# Patient Record
Sex: Female | Born: 1945 | Race: Black or African American | Hispanic: No | Marital: Married | Smoking: Never smoker
Health system: Southern US, Community
[De-identification: ages and names within clinical notes are randomized; demographics above are authoritative.]

## PROBLEM LIST (undated history)

## (undated) DIAGNOSIS — M1990 Unspecified osteoarthritis, unspecified site: Secondary | ICD-10-CM

## (undated) DIAGNOSIS — H269 Unspecified cataract: Secondary | ICD-10-CM

## (undated) DIAGNOSIS — D509 Iron deficiency anemia, unspecified: Secondary | ICD-10-CM

## (undated) DIAGNOSIS — G5601 Carpal tunnel syndrome, right upper limb: Secondary | ICD-10-CM

## (undated) DIAGNOSIS — R609 Edema, unspecified: Secondary | ICD-10-CM

## (undated) DIAGNOSIS — K5792 Diverticulitis of intestine, part unspecified, without perforation or abscess without bleeding: Secondary | ICD-10-CM

## (undated) DIAGNOSIS — M25529 Pain in unspecified elbow: Secondary | ICD-10-CM

## (undated) DIAGNOSIS — K5900 Constipation, unspecified: Secondary | ICD-10-CM

## (undated) DIAGNOSIS — K219 Gastro-esophageal reflux disease without esophagitis: Secondary | ICD-10-CM

## (undated) DIAGNOSIS — E785 Hyperlipidemia, unspecified: Secondary | ICD-10-CM

## (undated) DIAGNOSIS — N189 Chronic kidney disease, unspecified: Secondary | ICD-10-CM

## (undated) DIAGNOSIS — N318 Other neuromuscular dysfunction of bladder: Secondary | ICD-10-CM

## (undated) DIAGNOSIS — T7840XA Allergy, unspecified, initial encounter: Secondary | ICD-10-CM

## (undated) DIAGNOSIS — D649 Anemia, unspecified: Secondary | ICD-10-CM

## (undated) DIAGNOSIS — L84 Corns and callosities: Secondary | ICD-10-CM

## (undated) DIAGNOSIS — R5381 Other malaise: Principal | ICD-10-CM

## (undated) DIAGNOSIS — L299 Pruritus, unspecified: Secondary | ICD-10-CM

## (undated) DIAGNOSIS — Z8719 Personal history of other diseases of the digestive system: Secondary | ICD-10-CM

## (undated) DIAGNOSIS — N3281 Overactive bladder: Secondary | ICD-10-CM

## (undated) DIAGNOSIS — J209 Acute bronchitis, unspecified: Secondary | ICD-10-CM

## (undated) DIAGNOSIS — Z5189 Encounter for other specified aftercare: Secondary | ICD-10-CM

## (undated) DIAGNOSIS — M5030 Other cervical disc degeneration, unspecified cervical region: Secondary | ICD-10-CM

## (undated) DIAGNOSIS — R05 Cough: Secondary | ICD-10-CM

## (undated) DIAGNOSIS — I10 Essential (primary) hypertension: Secondary | ICD-10-CM

## (undated) DIAGNOSIS — M7520 Bicipital tendinitis, unspecified shoulder: Secondary | ICD-10-CM

## (undated) DIAGNOSIS — R5383 Other fatigue: Principal | ICD-10-CM

## (undated) DIAGNOSIS — J019 Acute sinusitis, unspecified: Secondary | ICD-10-CM

## (undated) DIAGNOSIS — L659 Nonscarring hair loss, unspecified: Secondary | ICD-10-CM

## (undated) HISTORY — DX: Allergy, unspecified, initial encounter: T78.40XA

## (undated) HISTORY — DX: Unspecified osteoarthritis, unspecified site: M19.90

## (undated) HISTORY — PX: OOPHORECTOMY: SHX86

## (undated) HISTORY — DX: Other malaise: R53.81

## (undated) HISTORY — DX: Carpal tunnel syndrome, right upper limb: G56.01

## (undated) HISTORY — DX: Overactive bladder: N32.81

## (undated) HISTORY — DX: Gastro-esophageal reflux disease without esophagitis: K21.9

## (undated) HISTORY — DX: Hyperlipidemia, unspecified: E78.5

## (undated) HISTORY — DX: Acute bronchitis, unspecified: J20.9

## (undated) HISTORY — DX: Bicipital tendinitis, unspecified shoulder: M75.20

## (undated) HISTORY — DX: Unspecified cataract: H26.9

## (undated) HISTORY — PX: APPENDECTOMY: SHX54

## (undated) HISTORY — DX: Other cervical disc degeneration, unspecified cervical region: M50.30

## (undated) HISTORY — DX: Chronic kidney disease, unspecified: N18.9

## (undated) HISTORY — DX: Diverticulitis of intestine, part unspecified, without perforation or abscess without bleeding: K57.92

## (undated) HISTORY — DX: Other fatigue: R53.83

## (undated) HISTORY — DX: Encounter for other specified aftercare: Z51.89

## (undated) HISTORY — PX: CATARACT EXTRACTION: SUR2

## (undated) HISTORY — DX: Other neuromuscular dysfunction of bladder: N31.8

## (undated) HISTORY — DX: Cough: R05

## (undated) HISTORY — DX: Corns and callosities: L84

## (undated) HISTORY — DX: Personal history of other diseases of the digestive system: Z87.19

## (undated) HISTORY — PX: BREAST BIOPSY: SHX20

## (undated) HISTORY — DX: Pain in unspecified elbow: M25.529

## (undated) HISTORY — DX: Pruritus, unspecified: L29.9

## (undated) HISTORY — PX: ABDOMINAL HYSTERECTOMY: SHX81

## (undated) HISTORY — DX: Constipation, unspecified: K59.00

## (undated) HISTORY — PX: BUNIONECTOMY: SHX129

## (undated) HISTORY — PX: ECTOPIC PREGNANCY SURGERY: SHX613

## (undated) HISTORY — DX: Essential (primary) hypertension: I10

## (undated) HISTORY — DX: Iron deficiency anemia, unspecified: D50.9

## (undated) HISTORY — DX: Anemia, unspecified: D64.9

## (undated) HISTORY — DX: Edema, unspecified: R60.9

## (undated) HISTORY — PX: OTHER SURGICAL HISTORY: SHX169

## (undated) HISTORY — DX: Nonscarring hair loss, unspecified: L65.9

## (undated) HISTORY — DX: Acute sinusitis, unspecified: J01.90

---

## 1997-11-29 ENCOUNTER — Ambulatory Visit (HOSPITAL_COMMUNITY): Admission: RE | Admit: 1997-11-29 | Discharge: 1997-11-29 | Payer: Self-pay | Admitting: Family Medicine

## 1998-12-04 ENCOUNTER — Encounter: Payer: Self-pay | Admitting: Family Medicine

## 1998-12-04 ENCOUNTER — Ambulatory Visit (HOSPITAL_COMMUNITY): Admission: RE | Admit: 1998-12-04 | Discharge: 1998-12-04 | Payer: Self-pay | Admitting: Family Medicine

## 1999-01-16 ENCOUNTER — Emergency Department (HOSPITAL_COMMUNITY): Admission: EM | Admit: 1999-01-16 | Discharge: 1999-01-16 | Payer: Self-pay | Admitting: Emergency Medicine

## 1999-01-16 ENCOUNTER — Encounter: Payer: Self-pay | Admitting: Emergency Medicine

## 1999-02-04 ENCOUNTER — Encounter: Admission: RE | Admit: 1999-02-04 | Discharge: 1999-02-04 | Payer: Self-pay | Admitting: Family Medicine

## 1999-02-04 ENCOUNTER — Encounter: Payer: Self-pay | Admitting: Family Medicine

## 1999-08-27 ENCOUNTER — Other Ambulatory Visit: Admission: RE | Admit: 1999-08-27 | Discharge: 1999-08-27 | Payer: Self-pay | Admitting: Urology

## 1999-08-28 ENCOUNTER — Other Ambulatory Visit: Admission: RE | Admit: 1999-08-28 | Discharge: 1999-08-28 | Payer: Self-pay | Admitting: Urology

## 1999-12-26 ENCOUNTER — Encounter: Payer: Self-pay | Admitting: Obstetrics and Gynecology

## 1999-12-26 ENCOUNTER — Ambulatory Visit (HOSPITAL_COMMUNITY): Admission: RE | Admit: 1999-12-26 | Discharge: 1999-12-26 | Payer: Self-pay | Admitting: Obstetrics and Gynecology

## 2001-02-15 ENCOUNTER — Ambulatory Visit (HOSPITAL_COMMUNITY): Admission: RE | Admit: 2001-02-15 | Discharge: 2001-02-15 | Payer: Self-pay | Admitting: Obstetrics and Gynecology

## 2001-02-15 ENCOUNTER — Encounter: Payer: Self-pay | Admitting: Obstetrics and Gynecology

## 2002-02-28 ENCOUNTER — Ambulatory Visit (HOSPITAL_COMMUNITY): Admission: RE | Admit: 2002-02-28 | Discharge: 2002-02-28 | Payer: Self-pay | Admitting: Internal Medicine

## 2002-02-28 ENCOUNTER — Encounter: Payer: Self-pay | Admitting: Internal Medicine

## 2002-05-09 ENCOUNTER — Ambulatory Visit (HOSPITAL_COMMUNITY): Admission: RE | Admit: 2002-05-09 | Discharge: 2002-05-09 | Payer: Self-pay | Admitting: Obstetrics and Gynecology

## 2002-05-09 ENCOUNTER — Encounter: Payer: Self-pay | Admitting: Obstetrics and Gynecology

## 2002-10-13 ENCOUNTER — Ambulatory Visit (HOSPITAL_COMMUNITY): Admission: RE | Admit: 2002-10-13 | Discharge: 2002-10-13 | Payer: Self-pay | Admitting: Internal Medicine

## 2003-05-29 ENCOUNTER — Ambulatory Visit (HOSPITAL_COMMUNITY): Admission: RE | Admit: 2003-05-29 | Discharge: 2003-05-29 | Payer: Self-pay | Admitting: Family Medicine

## 2004-01-11 ENCOUNTER — Encounter: Admission: RE | Admit: 2004-01-11 | Discharge: 2004-01-11 | Payer: Self-pay | Admitting: Family Medicine

## 2004-06-10 ENCOUNTER — Ambulatory Visit (HOSPITAL_COMMUNITY): Admission: RE | Admit: 2004-06-10 | Discharge: 2004-06-10 | Payer: Self-pay | Admitting: Obstetrics and Gynecology

## 2004-07-15 ENCOUNTER — Ambulatory Visit: Payer: Self-pay | Admitting: Internal Medicine

## 2004-07-16 ENCOUNTER — Ambulatory Visit: Payer: Self-pay | Admitting: Internal Medicine

## 2005-06-16 ENCOUNTER — Ambulatory Visit (HOSPITAL_COMMUNITY): Admission: RE | Admit: 2005-06-16 | Discharge: 2005-06-16 | Payer: Self-pay | Admitting: Obstetrics and Gynecology

## 2005-06-30 ENCOUNTER — Encounter: Admission: RE | Admit: 2005-06-30 | Discharge: 2005-06-30 | Payer: Self-pay | Admitting: Obstetrics and Gynecology

## 2005-10-29 ENCOUNTER — Ambulatory Visit (HOSPITAL_BASED_OUTPATIENT_CLINIC_OR_DEPARTMENT_OTHER): Admission: RE | Admit: 2005-10-29 | Discharge: 2005-10-29 | Payer: Self-pay | Admitting: Orthopedic Surgery

## 2006-06-17 ENCOUNTER — Ambulatory Visit (HOSPITAL_COMMUNITY): Admission: RE | Admit: 2006-06-17 | Discharge: 2006-06-17 | Payer: Self-pay | Admitting: Obstetrics and Gynecology

## 2007-07-26 ENCOUNTER — Ambulatory Visit (HOSPITAL_COMMUNITY): Admission: RE | Admit: 2007-07-26 | Discharge: 2007-07-26 | Payer: Self-pay | Admitting: Family Medicine

## 2007-07-26 ENCOUNTER — Encounter: Payer: Self-pay | Admitting: Internal Medicine

## 2007-08-01 ENCOUNTER — Other Ambulatory Visit: Admission: RE | Admit: 2007-08-01 | Discharge: 2007-08-01 | Payer: Self-pay | Admitting: Family Medicine

## 2007-08-01 LAB — CONVERTED CEMR LAB: Pap Smear: NORMAL

## 2007-11-14 ENCOUNTER — Encounter: Payer: Self-pay | Admitting: Internal Medicine

## 2007-12-08 ENCOUNTER — Encounter: Admission: RE | Admit: 2007-12-08 | Discharge: 2007-12-08 | Payer: Self-pay | Admitting: Family Medicine

## 2007-12-09 ENCOUNTER — Encounter: Payer: Self-pay | Admitting: Internal Medicine

## 2007-12-23 ENCOUNTER — Encounter: Payer: Self-pay | Admitting: Internal Medicine

## 2008-04-20 ENCOUNTER — Emergency Department (HOSPITAL_COMMUNITY): Admission: EM | Admit: 2008-04-20 | Discharge: 2008-04-20 | Payer: Self-pay | Admitting: Family Medicine

## 2008-06-06 ENCOUNTER — Ambulatory Visit: Payer: Self-pay | Admitting: Internal Medicine

## 2008-06-06 DIAGNOSIS — K5900 Constipation, unspecified: Secondary | ICD-10-CM | POA: Insufficient documentation

## 2008-06-06 DIAGNOSIS — Z8719 Personal history of other diseases of the digestive system: Secondary | ICD-10-CM

## 2008-06-06 DIAGNOSIS — E785 Hyperlipidemia, unspecified: Secondary | ICD-10-CM

## 2008-06-06 DIAGNOSIS — I10 Essential (primary) hypertension: Secondary | ICD-10-CM

## 2008-06-06 DIAGNOSIS — D509 Iron deficiency anemia, unspecified: Secondary | ICD-10-CM

## 2008-06-06 DIAGNOSIS — L84 Corns and callosities: Secondary | ICD-10-CM

## 2008-06-06 DIAGNOSIS — L659 Nonscarring hair loss, unspecified: Secondary | ICD-10-CM | POA: Insufficient documentation

## 2008-06-06 DIAGNOSIS — N318 Other neuromuscular dysfunction of bladder: Secondary | ICD-10-CM

## 2008-06-06 HISTORY — DX: Other neuromuscular dysfunction of bladder: N31.8

## 2008-06-06 HISTORY — DX: Nonscarring hair loss, unspecified: L65.9

## 2008-06-06 HISTORY — DX: Personal history of other diseases of the digestive system: Z87.19

## 2008-06-06 HISTORY — DX: Constipation, unspecified: K59.00

## 2008-06-06 HISTORY — DX: Corns and callosities: L84

## 2008-06-06 HISTORY — DX: Iron deficiency anemia, unspecified: D50.9

## 2008-06-06 HISTORY — DX: Hyperlipidemia, unspecified: E78.5

## 2008-06-11 ENCOUNTER — Telehealth (INDEPENDENT_AMBULATORY_CARE_PROVIDER_SITE_OTHER): Payer: Self-pay | Admitting: *Deleted

## 2008-06-12 ENCOUNTER — Emergency Department (HOSPITAL_COMMUNITY): Admission: EM | Admit: 2008-06-12 | Discharge: 2008-06-12 | Payer: Self-pay | Admitting: Emergency Medicine

## 2008-06-18 ENCOUNTER — Ambulatory Visit: Payer: Self-pay | Admitting: Internal Medicine

## 2008-06-18 DIAGNOSIS — R5381 Other malaise: Secondary | ICD-10-CM

## 2008-06-18 DIAGNOSIS — R5383 Other fatigue: Secondary | ICD-10-CM

## 2008-06-18 DIAGNOSIS — R059 Cough, unspecified: Secondary | ICD-10-CM

## 2008-06-18 DIAGNOSIS — J209 Acute bronchitis, unspecified: Secondary | ICD-10-CM

## 2008-06-18 DIAGNOSIS — R05 Cough: Secondary | ICD-10-CM

## 2008-06-18 HISTORY — DX: Other malaise: R53.81

## 2008-06-18 HISTORY — DX: Cough, unspecified: R05.9

## 2008-06-18 HISTORY — DX: Other fatigue: R53.83

## 2008-06-18 HISTORY — DX: Acute bronchitis, unspecified: J20.9

## 2008-06-19 ENCOUNTER — Telehealth: Payer: Self-pay | Admitting: Internal Medicine

## 2008-06-20 ENCOUNTER — Encounter: Payer: Self-pay | Admitting: Internal Medicine

## 2008-07-04 ENCOUNTER — Telehealth (INDEPENDENT_AMBULATORY_CARE_PROVIDER_SITE_OTHER): Payer: Self-pay | Admitting: *Deleted

## 2008-07-05 ENCOUNTER — Telehealth (INDEPENDENT_AMBULATORY_CARE_PROVIDER_SITE_OTHER): Payer: Self-pay | Admitting: *Deleted

## 2008-07-12 ENCOUNTER — Telehealth: Payer: Self-pay | Admitting: Internal Medicine

## 2008-08-03 ENCOUNTER — Ambulatory Visit: Payer: Self-pay | Admitting: Internal Medicine

## 2008-08-06 LAB — CONVERTED CEMR LAB
ALT: 34 units/L (ref 0–35)
AST: 29 units/L (ref 0–37)
Albumin: 4.1 g/dL (ref 3.5–5.2)
Alkaline Phosphatase: 72 units/L (ref 39–117)
BUN: 15 mg/dL (ref 6–23)
Basophils Relative: 0.3 % (ref 0.0–3.0)
Bilirubin Urine: NEGATIVE
CO2: 29 meq/L (ref 19–32)
Calcium: 9.7 mg/dL (ref 8.4–10.5)
Chloride: 105 meq/L (ref 96–112)
Cholesterol: 160 mg/dL (ref 0–200)
Creatinine, Ser: 1 mg/dL (ref 0.4–1.2)
HCT: 37.4 % (ref 36.0–46.0)
Hemoglobin, Urine: NEGATIVE
Hemoglobin: 12.5 g/dL (ref 12.0–15.0)
Ketones, ur: NEGATIVE mg/dL
LDL Cholesterol: 86 mg/dL (ref 0–99)
Lymphocytes Relative: 35.1 % (ref 12.0–46.0)
Lymphs Abs: 2.5 10*3/uL (ref 0.7–4.0)
Monocytes Relative: 7.4 % (ref 3.0–12.0)
Neutro Abs: 4.1 10*3/uL (ref 1.4–7.7)
RBC: 4.07 M/uL (ref 3.87–5.11)
Total CHOL/HDL Ratio: 3
Total Protein: 7.5 g/dL (ref 6.0–8.3)
Urine Glucose: NEGATIVE mg/dL
Urobilinogen, UA: 0.2 (ref 0.0–1.0)

## 2008-08-08 ENCOUNTER — Ambulatory Visit: Payer: Self-pay | Admitting: Internal Medicine

## 2008-12-27 ENCOUNTER — Encounter: Payer: Self-pay | Admitting: Internal Medicine

## 2009-01-01 ENCOUNTER — Ambulatory Visit: Payer: Self-pay | Admitting: Internal Medicine

## 2009-01-03 ENCOUNTER — Telehealth: Payer: Self-pay | Admitting: Internal Medicine

## 2009-01-08 ENCOUNTER — Ambulatory Visit: Payer: Self-pay | Admitting: Internal Medicine

## 2009-01-08 DIAGNOSIS — J019 Acute sinusitis, unspecified: Secondary | ICD-10-CM

## 2009-01-08 HISTORY — DX: Acute sinusitis, unspecified: J01.90

## 2009-01-21 ENCOUNTER — Ambulatory Visit: Payer: Self-pay | Admitting: Internal Medicine

## 2009-01-21 DIAGNOSIS — R21 Rash and other nonspecific skin eruption: Secondary | ICD-10-CM

## 2009-01-25 ENCOUNTER — Telehealth: Payer: Self-pay | Admitting: Internal Medicine

## 2009-01-30 ENCOUNTER — Encounter: Payer: Self-pay | Admitting: Internal Medicine

## 2009-01-30 ENCOUNTER — Ambulatory Visit: Payer: Self-pay

## 2009-02-01 ENCOUNTER — Telehealth: Payer: Self-pay | Admitting: Internal Medicine

## 2009-02-19 ENCOUNTER — Ambulatory Visit: Payer: Self-pay | Admitting: Internal Medicine

## 2009-02-19 DIAGNOSIS — R609 Edema, unspecified: Secondary | ICD-10-CM

## 2009-02-19 HISTORY — DX: Edema, unspecified: R60.9

## 2009-04-15 ENCOUNTER — Telehealth: Payer: Self-pay | Admitting: Internal Medicine

## 2009-04-25 ENCOUNTER — Ambulatory Visit: Payer: Self-pay | Admitting: Internal Medicine

## 2009-07-22 ENCOUNTER — Encounter: Payer: Self-pay | Admitting: Internal Medicine

## 2009-07-25 ENCOUNTER — Telehealth (INDEPENDENT_AMBULATORY_CARE_PROVIDER_SITE_OTHER): Payer: Self-pay | Admitting: *Deleted

## 2009-08-19 LAB — HM MAMMOGRAPHY: HM Mammogram: NORMAL

## 2009-09-09 ENCOUNTER — Telehealth: Payer: Self-pay | Admitting: Internal Medicine

## 2009-09-13 ENCOUNTER — Ambulatory Visit (HOSPITAL_COMMUNITY): Admission: RE | Admit: 2009-09-13 | Discharge: 2009-09-13 | Payer: Self-pay | Admitting: Obstetrics & Gynecology

## 2009-09-17 ENCOUNTER — Encounter: Payer: Self-pay | Admitting: Internal Medicine

## 2009-10-01 ENCOUNTER — Encounter: Payer: Self-pay | Admitting: Internal Medicine

## 2010-01-27 ENCOUNTER — Ambulatory Visit: Payer: Self-pay | Admitting: Internal Medicine

## 2010-01-27 LAB — CONVERTED CEMR LAB
ALT: 44 units/L — ABNORMAL HIGH (ref 0–35)
Alkaline Phosphatase: 76 units/L (ref 39–117)
Basophils Relative: 0.5 % (ref 0.0–3.0)
Bilirubin Urine: NEGATIVE
Bilirubin, Direct: 0.1 mg/dL (ref 0.0–0.3)
Chloride: 104 meq/L (ref 96–112)
Creatinine, Ser: 1 mg/dL (ref 0.4–1.2)
Eosinophils Relative: 2.2 % (ref 0.0–5.0)
Hemoglobin, Urine: NEGATIVE
LDL Cholesterol: 103 mg/dL — ABNORMAL HIGH (ref 0–99)
Lymphocytes Relative: 33.4 % (ref 12.0–46.0)
MCV: 90.1 fL (ref 78.0–100.0)
Monocytes Absolute: 0.4 10*3/uL (ref 0.1–1.0)
Neutrophils Relative %: 56.4 % (ref 43.0–77.0)
Potassium: 3.8 meq/L (ref 3.5–5.1)
RBC: 4.17 M/uL (ref 3.87–5.11)
Sodium: 141 meq/L (ref 135–145)
Total CHOL/HDL Ratio: 3
Total Protein, Urine: NEGATIVE mg/dL
Total Protein: 7.6 g/dL (ref 6.0–8.3)
Triglycerides: 124 mg/dL (ref 0.0–149.0)
Urine Glucose: NEGATIVE mg/dL
WBC: 5.9 10*3/uL (ref 4.5–10.5)

## 2010-02-04 ENCOUNTER — Ambulatory Visit: Payer: Self-pay | Admitting: Internal Medicine

## 2010-02-04 ENCOUNTER — Encounter: Payer: Self-pay | Admitting: Internal Medicine

## 2010-05-06 ENCOUNTER — Ambulatory Visit
Admission: RE | Admit: 2010-05-06 | Discharge: 2010-05-06 | Payer: Self-pay | Source: Home / Self Care | Attending: Internal Medicine | Admitting: Internal Medicine

## 2010-05-06 DIAGNOSIS — M7520 Bicipital tendinitis, unspecified shoulder: Secondary | ICD-10-CM

## 2010-05-06 HISTORY — DX: Bicipital tendinitis, unspecified shoulder: M75.20

## 2010-05-11 ENCOUNTER — Encounter: Payer: Self-pay | Admitting: Obstetrics and Gynecology

## 2010-05-20 NOTE — Letter (Signed)
Summary: [facility]   Imported By: Sherian Rein 09/18/2009 09:11:24  _____________________________________________________________________  External Attachment:    Type:   Image     Comment:   External Document

## 2010-05-20 NOTE — Assessment & Plan Note (Signed)
Summary: fu/appt/#/cd   Vital Signs:  Patient profile:   65 year old female Height:      62 inches Weight:      183 pounds BMI:     33.59 O2 Sat:      98 % on Room air Temp:     97.8 degrees F oral Pulse rate:   65 / minute BP sitting:   130 / 80  (left arm) Cuff size:   regular  Vitals Entered ByZella Ball Ewing (April 25, 2009 10:18 AM)  O2 Flow:  Room air CC: BP followup/RE   CC:  BP followup/RE.  History of Present Illness: lost 3 lbs since last visit;  tolerating the medication ok and good complaiicne,  had some first early AM fatgiue to start since last visit, but much less now that she seems to be more used to the regimen;  Pt denies CP, sob, doe, wheezing, orthopnea, pnd, worsening LE edema, palps, dizziness or syncope  Pt denies new neuro symptoms such as headache, facial or extremity weakness   Problems Prior to Update: 1)  Peripheral Edema  (ICD-782.3) 2)  Rash-nonvesicular  (ICD-782.1) 3)  Preventive Health Care  (ICD-V70.0) 4)  Sinusitis- Acute-nos  (ICD-461.9) 5)  Depression  (ICD-311) 6)  Cough  (ICD-786.2) 7)  Fatigue  (ICD-780.79) 8)  Bronchitis, Acute  (ICD-466.0) 9)  Constipation  (ICD-564.00) 10)  Calluses, Feet, Bilateral  (ICD-700) 11)  Anemia-iron Deficiency  (ICD-280.9) 12)  Overactive Bladder  (ICD-596.51) 13)  Alopecia  (ICD-704.00) 14)  Hypertension  (ICD-401.9) 15)  Hyperlipidemia  (ICD-272.4) 16)  Diverticulitis, Hx of  (ICD-V12.79)  Medications Prior to Update: 1)  Labetalol Hcl 200 Mg Tabs (Labetalol Hcl) .Marland Kitchen.. 1 By Mouth Two Times A Day 2)  Vesicare 10 Mg Tabs (Solifenacin Succinate) .Marland Kitchen.. 1 By Mouth Once Daily 3)  Venlafaxine Hcl 50 Mg Tabs (Venlafaxine Hcl) .Marland Kitchen.. 1 By Mouth Once Daily 4)  Pravachol 40 Mg Tabs (Pravastatin Sodium) .Marland Kitchen.. 1po Once Daily 5)  Vitamin D 6)  Tessalon Perles 100 Mg Caps (Benzonatate) .Marland Kitchen.. 1 - 2 By Mouth Three Times A Day As Needed 7)  Tribenzor 40-5-25 Mg Tabs (Olmesartan-Amlodipine-Hctz) .Marland Kitchen.. 1 By Mouth Once  Daily 8)  Triamcinolone Acetonide 0.5 % Crea (Triamcinolone Acetonide) .... Use Asd Two Times A Day As Needed  Current Medications (verified): 1)  Labetalol Hcl 200 Mg Tabs (Labetalol Hcl) .Marland Kitchen.. 1 By Mouth Two Times A Day 2)  Vesicare 10 Mg Tabs (Solifenacin Succinate) .Marland Kitchen.. 1 By Mouth Once Daily 3)  Venlafaxine Hcl 50 Mg Tabs (Venlafaxine Hcl) .Marland Kitchen.. 1 By Mouth Once Daily 4)  Pravachol 40 Mg Tabs (Pravastatin Sodium) .Marland Kitchen.. 1po Once Daily 5)  Vitamin D 6)  Tessalon Perles 100 Mg Caps (Benzonatate) .Marland Kitchen.. 1 - 2 By Mouth Three Times A Day As Needed 7)  Tribenzor 40-5-25 Mg Tabs (Olmesartan-Amlodipine-Hctz) .Marland Kitchen.. 1 By Mouth Once Daily 8)  Triamcinolone Acetonide 0.5 % Crea (Triamcinolone Acetonide) .... Use Asd Two Times A Day As Needed  Allergies (verified): 1)  ! Sulfa  Past History:  Past Medical History: Last updated: 06/06/2008 Diverticulitis, hx of Hyperlipidemia Hypertension alopecia OAB cervical degenerative disease right carpal tunnel syndrome Anemia-iron deficiency  Past Surgical History: Last updated: 06/06/2008 Hysterectomy Oophorectomy - unilateral hx of ectopic pregnancy Appendectomy hx of breast biopsy - benign s/p bunionectomy s/p right thumb surgury  Social History: Last updated: 06/06/2008 Never Smoked Alcohol use-yes Married 1 son work - Special educational needs teacher  Risk Factors: Smoking Status: never (  06/06/2008)  Review of Systems       all otherwise negative per pt -  Physical Exam  General:  alert and overweight-appearing.   Head:  normocephalic and atraumatic.   Eyes:  vision grossly intact, pupils equal, and pupils round.   Ears:  R ear normal and L ear normal.   Nose:  no external deformity and no nasal discharge.   Mouth:  no gingival abnormalities and pharynx pink and moist.   Neck:  supple and no masses.   Lungs:  normal respiratory effort and normal breath sounds.   Heart:  normal rate and regular rhythm.   Extremities:  no edema, no  erythema    Impression & Recommendations:  Problem # 1:  HYPERTENSION (ICD-401.9)  Her updated medication list for this problem includes:    Labetalol Hcl 200 Mg Tabs (Labetalol hcl) .Marland Kitchen... 1 by mouth two times a day    Tribenzor 40-5-25 Mg Tabs (Olmesartan-amlodipine-hctz) .Marland Kitchen... 1 by mouth once daily improved; Continue all previous medications as before this visit   Problem # 2:  PERIPHERAL EDEMA (ICD-782.3)  Her updated medication list for this problem includes:    Tribenzor 40-5-25 Mg Tabs (Olmesartan-amlodipine-hctz) .Marland Kitchen... 1 by mouth once daily resolved, Continue all previous medications as before this visit   Complete Medication List: 1)  Labetalol Hcl 200 Mg Tabs (Labetalol hcl) .Marland Kitchen.. 1 by mouth two times a day 2)  Vesicare 10 Mg Tabs (Solifenacin succinate) .Marland Kitchen.. 1 by mouth once daily 3)  Venlafaxine Hcl 50 Mg Tabs (Venlafaxine hcl) .Marland Kitchen.. 1 by mouth once daily 4)  Pravachol 40 Mg Tabs (Pravastatin sodium) .Marland Kitchen.. 1po once daily 5)  Vitamin D  6)  Tessalon Perles 100 Mg Caps (Benzonatate) .Marland Kitchen.. 1 - 2 by mouth three times a day as needed 7)  Tribenzor 40-5-25 Mg Tabs (Olmesartan-amlodipine-hctz) .Marland Kitchen.. 1 by mouth once daily 8)  Triamcinolone Acetonide 0.5 % Crea (Triamcinolone acetonide) .... Use asd two times a day as needed  Patient Instructions: 1)  Continue all previous medications as before this visit  2)  Please schedule a follow-up appointment in October 2011 with CPX labs

## 2010-05-20 NOTE — Assessment & Plan Note (Signed)
Summary: f/u appt/#/cd   Vital Signs:  Patient profile:   65 year old female Height:      62 inches Weight:      171.13 pounds BMI:     31.41 O2 Sat:      97 % on Room air Temp:     97.8 degrees F oral Pulse rate:   74 / minute BP sitting:   122 / 70  (left arm) Cuff size:   regular  Vitals Entered By: Zella Ball Ewing CMA Duncan Dull) (February 04, 2010 8:25 AM)  O2 Flow:  Room air  Preventive Care Screening  Mammogram:    Date:  08/19/2009    Results:  normal      decliens flu shot today  CC: followup/RE   CC:  followup/RE.  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    Problems Prior to Update: 1)  Preventive Health Care  (ICD-V70.0) 2)  Peripheral Edema  (ICD-782.3) 3)  Rash-nonvesicular  (ICD-782.1) 4)  Preventive Health Care  (ICD-V70.0) 5)  Sinusitis- Acute-nos  (ICD-461.9) 6)  Cough  (ICD-786.2) 7)  Fatigue  (ICD-780.79) 8)  Bronchitis, Acute  (ICD-466.0) 9)  Constipation  (ICD-564.00) 10)  Calluses, Feet, Bilateral  (ICD-700) 11)  Anemia-iron Deficiency  (ICD-280.9) 12)  Overactive Bladder  (ICD-596.51) 13)  Alopecia  (ICD-704.00) 14)  Hypertension  (ICD-401.9) 15)  Hyperlipidemia  (ICD-272.4) 16)  Diverticulitis, Hx of  (ICD-V12.79)  Medications Prior to Update: 1)  Labetalol Hcl 200 Mg Tabs (Labetalol Hcl) .Marland Kitchen.. 1 By Mouth Two Times A Day 2)  Vesicare 10 Mg Tabs (Solifenacin Succinate) .Marland Kitchen.. 1 By Mouth Once Daily 3)  Venlafaxine Hcl 50 Mg Tabs (Venlafaxine  Hcl) .Marland Kitchen.. 1 By Mouth Once Daily 4)  Pravachol 40 Mg Tabs (Pravastatin Sodium) .Marland Kitchen.. 1po Once Daily 5)  Vitamin D 6)  Tessalon Perles 100 Mg Caps (Benzonatate) .Marland Kitchen.. 1 - 2 By Mouth Three Times A Day As Needed 7)  Tribenzor 40-5-25 Mg Tabs (Olmesartan-Amlodipine-Hctz) .Marland Kitchen.. 1 By Mouth Once Daily 8)  Triamcinolone Acetonide 0.5 % Crea (Triamcinolone Acetonide) .... Use Asd Two Times A Day As Needed  Current Medications (verified): 1)  Labetalol Hcl 200 Mg Tabs (Labetalol Hcl) .Marland Kitchen.. 1 By Mouth Two Times A Day 2)  Vesicare 10 Mg Tabs (Solifenacin Succinate) .Marland Kitchen.. 1 By Mouth Once Daily 3)  Venlafaxine Hcl 50 Mg Tabs (Venlafaxine Hcl) .Marland Kitchen.. 1 By Mouth Once Daily 4)  Pravachol 40 Mg Tabs (Pravastatin Sodium) .Marland Kitchen.. 1po Once Daily 5)  Vitamin D 6)  Tessalon Perles 100 Mg Caps (Benzonatate) .Marland Kitchen.. 1 - 2 By Mouth Three Times A Day As Needed 7)  Tribenzor 40-5-25 Mg Tabs (Olmesartan-Amlodipine-Hctz) .Marland Kitchen.. 1 By Mouth Once Daily 8)  Triamcinolone Acetonide 0.5 % Crea (Triamcinolone Acetonide) .... Use Asd Two Times A Day As Needed  Allergies (verified): 1)  ! 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 Diverticulitis, hx of Hyperlipidemia Hypertension alopecia OAB cervical degenerative disease right carpal tunnel syndrome Anemia-iron deficiency  Past Surgical History: Hysterectomy Oophorectomy - unilateral hx of ectopic pregnancy Appendectomy hx of breast biopsy - benign s/p bunionectomy s/p right thumb surgury Cataract extraction - bilat aug 2011 - no longer wears glassesw  Family History: Reviewed  history from 06/06/2008 and no changes required. DM - brother mother - HTN, ovary cancer, elevated cholesterol uncle and aunt with cancer  Social History: Reviewed history from 06/06/2008 and no changes required. 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         all otherwise negative per pt -    Physical Exam  General:  alert and overweight-appearing.   Head:  normocephalic and atraumatic.   Eyes:  vision grossly intact, pupils equal, and pupils round.   Ears:  R ear normal and L ear normal.   Nose:  no external deformity and no nasal discharge.   Mouth:  no gingival abnormalities and pharynx pink and moist.   Neck:  supple and no masses.   Lungs:  normal respiratory effort and normal breath sounds.   Heart:  normal rate and regular rhythm.   Abdomen:  soft, non-tender, and normal bowel sounds.   Msk:  no joint tenderness and no joint swelling.   Extremities:  no edema, no erythema  Neurologic:  cranial nerves II-XII intact and strength normal in all extremities.   Skin:  color normal and no rashes.   Psych:  not anxious appearing and not depressed appearing.     Impression & Recommendations:  Problem # 1:  Preventive Health Care (ICD-V70.0)  Overall doing well, age appropriate education and counseling updated, referral for preventive services and immunizations addressed, dietary counseling and smoking status adressed , most recent labs reviewed with pt, ecg reviewed  with pt   Orders: EKG w/ Interpretation (93000)  Problem # 2:  HYPERTENSION (ICD-401.9)  Her updated medication list for this problem includes:    Labetalol Hcl 200 Mg Tabs (Labetalol hcl) .Marland Kitchen... 1 by mouth two times a day    Tribenzor 40-5-25 Mg Tabs (Olmesartan-amlodipine-hctz) .Marland Kitchen... 1 by mouth once daily  BP today: 122/70 Prior BP: 130/80 (04/25/2009)  Labs Reviewed: K+: 3.8 (01/27/2010) Creat: : 1.0 (01/27/2010)   Chol: 185 (01/27/2010)   HDL: 57.70 (01/27/2010)   LDL: 103 (01/27/2010)   TG: 124.0 (01/27/2010) stable overall by hx and exam, ok to continue meds/tx as is   Complete Medication List: 1)  Labetalol Hcl 200 Mg Tabs (Labetalol hcl) .Marland Kitchen.. 1 by mouth two times a day 2)  Vesicare 10 Mg Tabs (Solifenacin succinate) .Marland Kitchen.. 1 by mouth once daily 3)  Venlafaxine Hcl 50 Mg Tabs (Venlafaxine hcl) .Marland Kitchen.. 1 by mouth once daily 4)  Pravachol 40 Mg Tabs (Pravastatin sodium) .Marland Kitchen.. 1po once daily 5)  Vitamin D  6)  Tribenzor 40-5-25 Mg  Tabs (Olmesartan-amlodipine-hctz) .Marland Kitchen.. 1 by mouth once daily  Patient Instructions: 1)  please call Your GYN for yearly pap smear, and last bone density test if available 2)  please remember to have your flu shot at work 3)  Continue all previous medications as before this visit 4)  Please schedule a follow-up appointment in 1 year or sooner if needed Prescriptions: TRIBENZOR 40-5-25 MG TABS (OLMESARTAN-AMLODIPINE-HCTZ) 1 by mouth once daily  #90 x 3   Entered and Authorized by:   Corwin Levins MD   Signed by:   Corwin Levins MD on 02/04/2010   Method used:   Print then Give to Patient   RxID:   [id number] PRAVACHOL 40 MG TABS (PRAVASTATIN SODIUM) 1po once daily  #90 x 3   Entered and Authorized by:   Corwin Levins MD   Signed by:   Corwin Levins MD on 02/04/2010   Method used:   Print then Give to Patient   RxID:   [id number] VENLAFAXINE HCL 50 MG TABS (VENLAFAXINE HCL) 1 by mouth once daily  #90 x 3   Entered and Authorized by:   Corwin Levins MD   Signed by:   Corwin Levins MD on 02/04/2010   Method used:   Print then Give to Patient   RxID:   [id number] VESICARE 10 MG TABS (SOLIFENACIN SUCCINATE) 1 by mouth once daily  #90 x 3   Entered and Authorized by:   Corwin Levins MD   Signed by:   Corwin Levins MD on 02/04/2010   Method used:   Print then Give to Patient   RxID:   [id number] LABETALOL HCL 200 MG TABS (LABETALOL HCL) 1 by mouth two times a day  #180 x 3   Entered and Authorized by:   Corwin Levins MD   Signed by:   Corwin Levins MD on 02/04/2010   Method used:   Print then Give to Patient   RxID:   [id number]    Orders Added: 1)  EKG w/ Interpretation [93000] 2)  Est. Patient 40-64 years [51884]

## 2010-05-20 NOTE — Progress Notes (Signed)
Summary: Dx Error?  Phone Note Call from Patient Call back at Home Phone [phone]   Caller: Patient Summary of Call: Pt called stating that she was originally Rx'd Venlafaxine by a NP at [facility] for hot flashes but when it was added to her med list here it was attached to a Dx of Depression. Pt says this Dx is causing her Insurance rates to double. Pt is requesting Dx be changed on her Medical Record to reflect correct Dx. please advise Initial call taken by: Margaret Pyle, CMA,  Sep 09, 2009 10:21 AM  Follow-up for Phone Call        not sure how to help, since that is what the med was for;  there is no other diagnosis that I could change to Follow-up by: Corwin Levins MD,  Sep 09, 2009 12:32 PM  Additional Follow-up for Phone Call Additional follow up Details #1::        pt informed to have NP that Rx'd medication send OV for JWJ to review. Pt advised that Dx and medication may be able to be removed and pt can get refills from original prescriber. Additional Follow-up by: Margaret Pyle, CMA,  Sep 09, 2009 1:55 PM

## 2010-05-20 NOTE — Progress Notes (Signed)
Summary: Records request from [employer]  Request for records received from [employer]. Request forwarded to [employer]. Dena Chavis  July 25, 2009 3:04 PM

## 2010-05-22 NOTE — Assessment & Plan Note (Signed)
Summary: L ARM PAIN FOR OVER A WK/NWS  #   Vital Signs:  Patient profile:   65 year old female Height:      62 inches (157.48 cm) Weight:      170 pounds (77.27 kg) O2 Sat:      97 % on Room air Temp:     98.7 degrees F (37.06 degrees C) oral Pulse rate:   80 / minute BP sitting:   140 / 72  (left arm) Cuff size:   regular  Vitals Entered By: Orlan Leavens RMA (May 06, 2010 9:03 AM)  O2 Flow:  Room air CC: Pain in (L) arm x's 3 weeks Is Patient Diabetic? No Pain Assessment Patient in pain? yes     Location: (L) arm Type: aching   Primary Care Provider:  Corwin Levins MD  CC:  Pain in (L) arm x's 3 weeks.  History of Present Illness: c/o pain in L arm located over antecubital fossa onset 3 weeks ago denies precipitating trauma or injury worse in AM (sleeping with LUE bent at elbow all night) or lifting (bicep curl) not exac by movement during the day no hx same no shoulder, neck or hand pain - no numbness or weakness  Current Medications (verified): 1)  Labetalol Hcl 200 Mg Tabs (Labetalol Hcl) .Marland Kitchen.. 1 By Mouth Two Times A Day 2)  Vesicare 10 Mg Tabs (Solifenacin Succinate) .Marland Kitchen.. 1 By Mouth Once Daily 3)  Venlafaxine Hcl 50 Mg Tabs (Venlafaxine Hcl) .Marland Kitchen.. 1 By Mouth Once Daily 4)  Pravachol 40 Mg Tabs (Pravastatin Sodium) .Marland Kitchen.. 1po Once Daily 5)  Vitamin D 6)  Tribenzor 40-5-25 Mg Tabs (Olmesartan-Amlodipine-Hctz) .Marland Kitchen.. 1 By Mouth Once Daily  Allergies (verified): 1)  ! Sulfa  Past History:  Past Medical History: Diverticulitis, hx of Hyperlipidemia Hypertension alopecia OAB cervical degenerative disease right carpal tunnel syndrome Anemia-iron deficiency  Review of Systems  The patient denies fever, chest pain, syncope, and headaches.    Physical Exam  General:  alert, well-developed, well-nourished, and cooperative to examination.    Lungs:  normal respiratory effort, no intercostal retractions or use of accessory muscles; normal breath sounds  bilaterally - no crackles and no wheezes.    Heart:  normal rate, regular rhythm, no murmur, and no rub. BLE without edema. normal DP pulses and normal cap refill in all 4 extremities    Msk:  left biceps tendonitis, ligamentous fx intact - no deformity and FROM elbow with good strength   Impression & Recommendations:  Problem # 1:  BICEPS TENDINITIS, LEFT (ICD-726.12)  oral NSAIDs now - education on dx and tx consider topical nsaid or PT if unimproved symptoms  -  pt understands and agrees to same  Orders: Prescription Created Electronically [phone])  Problem # 2:  HYPERTENSION (ICD-401.9)  watch bp on short term nsaid use - Her updated medication list for this problem includes:    Labetalol Hcl 200 Mg Tabs (Labetalol hcl) .Marland Kitchen... 1 by mouth two times a day    Tribenzor 40-5-25 Mg Tabs (Olmesartan-amlodipine-hctz) .Marland Kitchen... 1 by mouth once daily  BP today: 140/72 Prior BP: 122/70 (02/04/2010)  Labs Reviewed: K+: 3.8 (01/27/2010) Creat: : 1.0 (01/27/2010)   Chol: 185 (01/27/2010)   HDL: 57.70 (01/27/2010)   LDL: 103 (01/27/2010)   TG: 124.0 (01/27/2010)  Complete Medication List: 1)  Labetalol Hcl 200 Mg Tabs (Labetalol hcl) .Marland Kitchen.. 1 by mouth two times a day 2)  Vesicare 10 Mg Tabs (Solifenacin succinate) .Marland Kitchen.. 1 by mouth  once daily 3)  Venlafaxine Hcl 50 Mg Tabs (Venlafaxine hcl) .Marland Kitchen.. 1 by mouth once daily 4)  Pravachol 40 Mg Tabs (Pravastatin sodium) .Marland Kitchen.. 1po once daily 5)  Vitamin D  6)  Tribenzor 40-5-25 Mg Tabs (Olmesartan-amlodipine-hctz) .Marland Kitchen.. 1 by mouth once daily 7)  Diclofenac Sodium 50 Mg Tbec (Diclofenac sodium) .Marland Kitchen.. 1 by mouth two times a day x 7 days, then as needed for pain  Patient Instructions: 1)  it was good to see you today. 2)  use diclofenac two times a day x 7 days, then as needed for elbow pain - your prescription has been electronically submitted to your pharmacy. Please take as directed. Contact our office if you believe you're having problems with the  medication(s).  3)  wear at elastic elbow band at night as discussed 4)  Return in 7-10 days if you're not better:sooner if you're feeling worse. Prescriptions: DICLOFENAC SODIUM 50 MG TBEC (DICLOFENAC SODIUM) 1 by mouth two times a day x 7 days, then as needed for pain  #30 x 0   Entered and Authorized by:   Newt Lukes MD   Signed by:   Newt Lukes MD on 05/06/2010   Method used:   Electronically to        [facility] (retail)       [address]       [place], [place]  [address]       Ph: [phone]       Fax: [phone]   RxID:   [id number]    Orders Added: 1)  Est. Patient Level IV [66063] 2)  Prescription Created Electronically [phone]

## 2010-05-23 NOTE — Letter (Signed)
[facility]   Imported By: Sherian Rein 09/18/2009 09:10:37  _____________________________________________________________________  External Attachment:    Type:   Image     Comment:   External Document

## 2010-05-26 ENCOUNTER — Ambulatory Visit (INDEPENDENT_AMBULATORY_CARE_PROVIDER_SITE_OTHER)
Admission: RE | Admit: 2010-05-26 | Discharge: 2010-05-26 | Disposition: A | Discharge status: Home / Self Care | Payer: BC Managed Care – PPO | Source: Ambulatory Visit | Attending: Internal Medicine | Admitting: Internal Medicine

## 2010-05-26 ENCOUNTER — Ambulatory Visit (INDEPENDENT_AMBULATORY_CARE_PROVIDER_SITE_OTHER): Payer: BC Managed Care – PPO | Admitting: Internal Medicine

## 2010-05-26 ENCOUNTER — Encounter: Payer: Self-pay | Admitting: Internal Medicine

## 2010-05-26 ENCOUNTER — Other Ambulatory Visit: Payer: Self-pay | Admitting: Internal Medicine

## 2010-05-26 DIAGNOSIS — L299 Pruritus, unspecified: Secondary | ICD-10-CM

## 2010-05-26 DIAGNOSIS — M25529 Pain in unspecified elbow: Secondary | ICD-10-CM

## 2010-05-26 DIAGNOSIS — R21 Rash and other nonspecific skin eruption: Secondary | ICD-10-CM

## 2010-05-26 HISTORY — DX: Pain in unspecified elbow: M25.529

## 2010-05-27 ENCOUNTER — Telehealth: Payer: Self-pay | Admitting: Internal Medicine

## 2010-05-30 ENCOUNTER — Telehealth: Payer: Self-pay | Admitting: Internal Medicine

## 2010-06-05 NOTE — Progress Notes (Signed)
Summary: PHARM ?  Phone Note From Pharmacy   Caller: [facility] Summary of Call: [facility] called - pharm want clarification on pennsaid rx. 3 to 5 drops seems like very low dose. Is this correct?  Initial call taken by: Lamar Sprinkles, CMA,  May 27, 2010 12:09 PM  Follow-up for Phone Call        It is for smaller joints. Correct dose on RX Follow-up by: Tresa Garter MD,  May 27, 2010 3:25 PM  Additional Follow-up for Phone Call Additional follow up Details #1::        Pharm informed Additional Follow-up by: Lamar Sprinkles, CMA,  May 27, 2010 3:43 PM

## 2010-06-05 NOTE — Assessment & Plan Note (Signed)
Summary: DR JWJ PT NO SLOT-L ARM PAIN-SEEN BY DR VL IN JAN CONCERNING ...   Vital Signs:  Patient profile:   65 year old female Height:      62 inches Weight:      171 pounds BMI:     31.39 Temp:     97.8 degrees F oral Pulse rate:   73 / minute Pulse rhythm:   regular Resp:     16 per minute BP sitting:   148 / 82  (left arm) Cuff size:   regular  Vitals Entered By: Lanier Prude, Beverly Gust) (May 26, 2010 2:44 PM) CC: intermittent pain in Lt arm   Primary Care Provider:  Corwin Levins MD  CC:  intermittent pain in Lt arm.  History of Present Illness: C/o dull pain in L forearm/elbow, bending makes it worse.  She had it X 2 months . Meds did not help... C/o scratcing all over at night x years  Current Medications (verified): 1)  Labetalol Hcl 200 Mg Tabs (Labetalol Hcl) .Marland Kitchen.. 1 By Mouth Two Times A Day 2)  Vesicare 10 Mg Tabs (Solifenacin Succinate) .Marland Kitchen.. 1 By Mouth Once Daily 3)  Venlafaxine Hcl 50 Mg Tabs (Venlafaxine Hcl) .Marland Kitchen.. 1 By Mouth Once Daily 4)  Pravachol 40 Mg Tabs (Pravastatin Sodium) .Marland Kitchen.. 1po Once Daily 5)  Vitamin D 6)  Tribenzor 40-5-25 Mg Tabs (Olmesartan-Amlodipine-Hctz) .Marland Kitchen.. 1 By Mouth Once Daily 7)  Diclofenac Sodium 50 Mg Tbec (Diclofenac Sodium) .Marland Kitchen.. 1 By Mouth Two Times A Day X 7 Days, Then As Needed For Pain  Allergies: 1)  ! Sulfa 2)  ! Allegra Allergy (Fexofenadine Hcl) 3)  ! 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    Physical Exam  General:  alert, well-developed, well-nourished, and  cooperative to examination.    Mouth:  no gingival abnormalities and pharynx pink and moist.   Neck:  supple and no masses.   Lungs:  normal respiratory effort, no intercostal retractions or use of accessory muscles; normal breath sounds bilaterally - no crackles and no wheezes.    Heart:  normal rate, regular rhythm, no murmur, and no rub. BLE without edema. normal DP pulses and normal cap refill in all 4 extremities    Msk:  left biceps distally and prox wrist , ligamentous fracture flexors are tender intact - no deformity and FROM elbow with good strength Skin:  dry rash in elbows   Impression & Recommendations:  Problem # 1:  ELBOW PAIN (ICD-719.42) L Assessment Deteriorated  Pennsaid Pred Take 40mg  qd for 3 days, then 20 mg qd for 3 days, then 10mg  qd for 6 days, then stop. Take pc.  Inject if not better Xray  Orders: T-Elbow Left 2 View (73070TC) Ace Wraps 3-5 in/yard  (A2130)  Problem # 2:  SKIN RASH (ICD-782.1) ? eczema Assessment: Unchanged  Her updated medication list for this problem includes:    Triamcinolone Acetonide 0.5 % Crea (Triamcinolone acetonide) ..... Use two times a day prn  Problem # 3:  PRURITUS (ICD-698.9) Assessment: Unchanged Triamc cream Med options discussed  Complete Medication List: 1)  Labetalol Hcl 200  Mg Tabs (Labetalol hcl) .Marland Kitchen.. 1 by mouth two times a day 2)  Vesicare 10 Mg Tabs (Solifenacin succinate) .Marland Kitchen.. 1 by mouth once daily 3)  Venlafaxine Hcl 50 Mg Tabs (Venlafaxine hcl) .Marland Kitchen.. 1 by mouth once daily 4)  Pravachol 40 Mg Tabs (Pravastatin sodium) .Marland Kitchen.. 1po once daily 5)  Vitamin D  6)  Tribenzor 40-5-25 Mg Tabs (Olmesartan-amlodipine-hctz) .Marland Kitchen.. 1 by mouth once daily 7)  Pennsaid 1.5 % Soln (Diclofenac sodium) .... 3-5 gtt on skin three times a day for pain 8)  Prednisone 10 Mg Tabs (Prednisone) .... Take 40mg  qd for 3 days, then 20 mg qd for 3 days, then 10mg  qd for 6 days, then stop. take pc. 9)  Triamcinolone Acetonide 0.5 % Crea  (Triamcinolone acetonide) .... Use two times a day prn  Patient Instructions: 1)  Please schedule a follow-up appointment in 1 month Dr Jonny Ruiz. 2)  Ice qid  Prescriptions: TRIAMCINOLONE ACETONIDE 0.5 % CREA (TRIAMCINOLONE ACETONIDE) use two times a day prn  #120 g x 3   Entered and Authorized by:   Tresa Garter MD   Signed by:   Tresa Garter MD on 05/26/2010   Method used:   Print then Give to Patient   RxID:   [id number] PREDNISONE 10 MG TABS (PREDNISONE) Take 40mg  qd for 3 days, then 20 mg qd for 3 days, then 10mg  qd for 6 days, then stop. Take pc.  #24 x 1   Entered and Authorized by:   Tresa Garter MD   Signed by:   Tresa Garter MD on 05/26/2010   Method used:   Print then Give to Patient   RxID:   [id number] PENNSAID 1.5 % SOLN (DICLOFENAC SODIUM) 3-5 gtt on skin three times a day for pain  #1 x 3   Entered and Authorized by:   Tresa Garter MD   Signed by:   Tresa Garter MD on 05/26/2010   Method used:   Print then Give to Patient   RxID:   [id number]    Orders Added: 1)  T-Elbow Left 2 View [73070TC] 2)  Ace Wraps 3-5 in/yard  [A6449] 3)  Est. Patient Level IV [87564]

## 2010-06-05 NOTE — Progress Notes (Signed)
Summary: xray results  Phone Note Call from Patient Call back at Home Phone [phone] Call back at [phone]   Caller: Patient Summary of Call: Patient called [facility] requesting results fo arm xray done recently. Please advise Initial call taken by: Rock Nephew CMA,  May 30, 2010 1:46 PM  Follow-up for Phone Call        no fracture, but there was a bone spur - does she want a referral to ortho? Follow-up by: Corwin Levins MD,  May 30, 2010 1:54 PM  Additional Follow-up for Phone Call Additional follow up Details #1::        Notified pt with md reponse. pt states yes she would like to see ortho. Let pt know once referral has been set-up will be contacted by our [facility] with appt info Additional Follow-up by: Orlan Leavens RMA,  May 30, 2010 2:08 PM    Additional Follow-up for Phone Call Additional follow up Details #2::    referral done Follow-up by: Corwin Levins MD,  May 30, 2010 2:51 PM

## 2010-09-01 ENCOUNTER — Other Ambulatory Visit (HOSPITAL_COMMUNITY): Payer: Self-pay | Admitting: Obstetrics & Gynecology

## 2010-09-01 DIAGNOSIS — Z1231 Encounter for screening mammogram for malignant neoplasm of breast: Secondary | ICD-10-CM

## 2010-09-05 NOTE — Op Note (Signed)
   NAME:  Deborah Schultz, Deborah Schultz                       ACCOUNT NO.:  [id number]   MEDICAL RECORD NO.:  [id number]                   PATIENT TYPE:  AMB   LOCATION:  ENDO                                 FACILITY:  [facility]   PHYSICIAN:  Lina Sar, M.D. [facility]               DATE OF BIRTH:  1945-09-25   DATE OF PROCEDURE:  10/13/2002  DATE OF DISCHARGE:                                 OPERATIVE REPORT   PROCEDURE:  Colonoscopy.   ENDOSCOPE:  An Olympus single chamber video endoscope.   SEDATION:  None.   FINDINGS:  The Olympus single chamber video endoscope was passed into the  rectum direct into the sigmoid colon.  The patient was monitored by pulse  oximeter and her oxygen saturations were normal.  Prep was excellent.  Anal  canal and rectal ampulla was normal.  There was scattered diverticula  throughout the sigmoid colon, also diverticula were found in the right  colon.  The splenic flexure, transverse colon, hepatic flexure were  unremarkable.  The cecal pouch was entered without difficulty and showed  normal ileocecal valve and appendiceal opening.  The colonoscope was then  retracted, the colon decompressed.  The patient tolerated the procedure  well.   IMPRESSION:  Universal diverticulosis of the left and right colon.   PLAN:  1. High fiber diet.  2. Hemoccult every two years.  3. Consider repeat colonoscopy in 10 years.                                               Lina Sar, M.D. [facility]    DB/MEDQ  D:  10/13/2002  T:  10/13/2002  Job:  [id number]

## 2010-09-05 NOTE — Op Note (Signed)
NAME:  Deborah Schultz, Deborah Schultz                       ACCOUNT NO.:  [id number]   MEDICAL RECORD NO.:  [id number]                   PATIENT TYPE:  AMB   LOCATION:  ENDO                                 FACILITY:  [facility]   PHYSICIAN:  Lina Sar, M.D. [facility]               DATE OF BIRTH:  07/23/1945   DATE OF PROCEDURE:  10/13/2002  DATE OF DISCHARGE:                                 OPERATIVE REPORT   NAME OF PROCEDURE:  Upper endoscopy and colonoscopy.   INDICATIONS FOR PROCEDURE:  This 65 year old African-American female has had  chronic intermittent dysphagia.  Barium swallow showed a short esophageal  ring as well as motility disorder.  She has had problems swallowing solids,  as well a cold liquids.  She is undergoing upper endoscopy to further  evaluate her symptoms and to proceed with esophageal dilatation depending on  the findings of the endoscopy.  She is also undergoing screening colonoscopy  because of her age of 61.   ENDOSCOPE:  Olympus single chamber video endoscope.   SEDATION:  1. Versed 8 mg IV.  2. Demerol 80 mg IV.   FINDINGS:  The Olympus single chamber video endoscope was passed through the  posterior pharynx into the esophagus.  The patient was monitored by pulse  oximeter.  Oxygen saturations were normal.  She was cooperative.  The  proximal and mid esophageal mucosa and lumen were normal.  There were  peristaltic contractions which were observed propagating through the  esophagus.  At the distal esophagus, the lumen was closed, and I had  difficulty in advancing into the stomach.  The lower esophageal sphincter  was tight and I exerted pressure for the endoscope to pop through.  Once it  popped through, the ileus lumen opened.  As the endoscope was retracted back  to the ileus, it was noted that there was a mild esophageal stricture with  an esophageal ring and a mild shelving of the stricture.  Endoscope  traversed back and forth easily through the sphincter.   There was no longer  any spasm over the lower esophageal sphincter.   The stomach was insufflated and it showed normal-appearing gastric mucosa.  Pyloric outlet was normal.  Retroflexion of endoscope revealed normal fundus  and cardia.   The duodenum, duodenal bulb, and descending duodenum were normal.  A guide  wire was then placed into the stomach and Savory dilators passed over the  guide wire using 15, 16, and 17 mm dilators.  There was no blood on the  dilator.  The patient tolerated the procedure well.   IMPRESSION:  1. 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  2. 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    PLAN:  Observe patient after dilatation for the next several weeks.  When  dysphagia is relieved, no further evaluation is necessary.  If the dysphagia  is not relieved, we should proceed with esophageal motility studies to  determine her ________ and to rule out acolasia.                                               Lina Sar, M.D. [facility]    DB/MEDQ  D:  10/13/2002  T:  10/13/2002  Job:  [id number]   cc:   Vale Haven. Andrey Campanile, M.D.  [address]  [place]  [place] [address]  Fax: [phone]

## 2010-09-05 NOTE — Op Note (Signed)
NAMEJAZLYNE, GAUGER           ACCOUNT NO.:  [id number]   MEDICAL RECORD NO.:  [id number]            PATIENT TYPE:   LOCATION:                                 FACILITY:   PHYSICIAN:  Katy Fitch. Sypher, M.D.      DATE OF BIRTH:   DATE OF PROCEDURE:  10/24/2005  DATE OF DISCHARGE:                                 OPERATIVE REPORT   PREOP DIAGNOSIS:  1.  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  2.  Mucoid cyst dorsal nail fold causing deformity of nail plate.  3.  Right carpal tunnel syndrome.   POSTOP DIAGNOSIS:  1.  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  2.  Mucoid cyst dorsal nail fold causing deformity of nail plate.  3.  Right carpal tunnel syndrome.   OPERATION:  1.  Arthrotomy of right thumb IP joint with removal of osteophytes from      adjacent surfaces of distal and proximal phalanx with extensive distal      interphalangeal joint synovectomy and debridement.  2.  Resection of mucoid cyst at dorsal nail fold decompressing nail matrix.  3.  Injection of right carpal tunnel/ulnar bursa.   OPERATING SURGEON:  Katy Fitch. Sypher, M.D.   ASSISTANT:  Marveen Reeks. Dasnoit, P.A.-C   ANESTHESIA:  2% lidocaine metacarpal head level block of right thumb  supplemented by IV sedation.   SUPERVISING ANESTHESIOLOGIST:  Janetta Hora. Gelene Mink, M.D.   INDICATIONS:  Ashten Sarnowski is a 66 year old woman referred for evaluation  and management of a painful right thumb with a significant nail deformity;  and obvious mucous cyst at the dorsal nail fold.  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  In  addition, Ms. Waldron had numbness of her hand and clinical evaluation  revealed right carpal tunnel syndrome.   We recommended sleeping in a  night splint.  Her electrodiagnostic studies  were not pathologic enough to warrant proceeding with carpal tunnel release  at this time.  We recommended consideration of steroid injection into a  right ulnar bursa.  She requested that we resect her mucoid cyst and debride  her DIP joint to relieve her unsightly osteophytes and relieve her nail  deformity.  We recommended proceeding with arthrotomy at this time.  She  understands that the arthrotomy will not stop the progress of her arthritis;  however, by removing the osteophytes, debriding the joint, and removing the  mucous cyst we should be able to restore her nail function to near normal;  and appearance of the nail to near normal.   After informed consent, she was brought to the operating room at this time.   DESCRIPTION OF PROCEDURE:  Aiyla Baucom is brought to the operating room  and placed in the supine position on the operating table.  Following light  sedation the right arm was prepped with Betadine soap and solution and  sterilely draped.  When the  sedation was adequate, we injected the ulnar  bursa with the fingers in full flexion with a 27-gauge needle in the line of  the ring finger.  A mixture of 1 mL of Depo-Medrol 40 mg/mL and 1 mL of 1%  plain lidocaine was infiltrated without complication.  The fingers were  extended at the time of injection to prevent into tendinous injection   Attention was then directed to the thumb.  After digital block had set; and  there was adequate anesthesia of the thumb; the arm was exsanguinated and an  atrial tourniquet in he proximal brachium inflated to 220 mmHg.  A Mercedes-  type incision was used to expose the mucoid cyst distally and the extensor  mechanism and DIP joint capsule proximally.  Care was taken to preserve the  dorsal neurovascular structures.  The joint was entered with a dorsal radial  and dorsal ulnar arthrotomy.  A large osteophyte measuring 8 mm in length  and  approximately 4 mm in height was carefully dissected circumferentially  with a fine Therapist, nutritional, releasing the capsule, and adipose structures.  This was removed with a fine rongeur with piecemeal technique.   A micro curette was used to remove the radial osteophyte at the base of the  distal phalanx.  The joint was then meticulously irrigated with a 19-gauge  dental needle and sterile saline.  The deformities of the thumb were  corrected by this osteophyte debridement and synovectomy.  The mucoid cyst  was circumferentially dissected and found be tracking towards the ulnar  aspect of the IP joint.  The cyst, its contents, and its sinus tract were  all excised.  The wound was then irrigated.  Hemostasis achieved with  bipolar cautery, followed by repair of the skin with corner suture of 5-0  nylon and horizontal mattress sutures of 5-0 nylon.  There were no apparent  complications.      Katy Fitch Sypher, M.D.  Electronically Signed     RVS/MEDQ  D:  10/29/2005  T:  10/29/2005  Job:  [id number]   cc:   Quita Skye. Artis Flock, M.D.  Fax: [phone]

## 2010-09-17 ENCOUNTER — Ambulatory Visit (HOSPITAL_COMMUNITY)
Admission: RE | Admit: 2010-09-17 | Discharge: 2010-09-17 | Disposition: A | Discharge status: Home / Self Care | Payer: BC Managed Care – PPO | Source: Ambulatory Visit | Attending: Obstetrics & Gynecology | Admitting: Obstetrics & Gynecology

## 2010-09-17 DIAGNOSIS — Z1231 Encounter for screening mammogram for malignant neoplasm of breast: Secondary | ICD-10-CM

## 2011-01-27 ENCOUNTER — Telehealth: Payer: Self-pay

## 2011-01-27 DIAGNOSIS — Z0000 Encounter for general adult medical examination without abnormal findings: Secondary | ICD-10-CM

## 2011-01-27 NOTE — Telephone Encounter (Signed)
Put order in for physical labs. 

## 2011-02-11 ENCOUNTER — Other Ambulatory Visit: Payer: Self-pay | Admitting: Internal Medicine

## 2011-02-19 ENCOUNTER — Encounter: Payer: Self-pay | Admitting: Internal Medicine

## 2011-02-19 DIAGNOSIS — R5381 Other malaise: Secondary | ICD-10-CM

## 2011-02-19 DIAGNOSIS — R609 Edema, unspecified: Secondary | ICD-10-CM

## 2011-02-20 ENCOUNTER — Encounter: Payer: Self-pay | Admitting: Internal Medicine

## 2011-02-20 DIAGNOSIS — Z0001 Encounter for general adult medical examination with abnormal findings: Secondary | ICD-10-CM | POA: Insufficient documentation

## 2011-02-20 DIAGNOSIS — Z0000 Encounter for general adult medical examination without abnormal findings: Secondary | ICD-10-CM | POA: Insufficient documentation

## 2011-02-23 ENCOUNTER — Other Ambulatory Visit (INDEPENDENT_AMBULATORY_CARE_PROVIDER_SITE_OTHER): Payer: BC Managed Care – PPO

## 2011-02-23 DIAGNOSIS — Z0000 Encounter for general adult medical examination without abnormal findings: Secondary | ICD-10-CM

## 2011-02-23 LAB — CBC WITH DIFFERENTIAL/PLATELET
Basophils Relative: 0.6 % (ref 0.0–3.0)
Eosinophils Relative: 2.8 % (ref 0.0–5.0)
HCT: 38.8 % (ref 36.0–46.0)
Hemoglobin: 13.1 g/dL (ref 12.0–15.0)
Lymphs Abs: 1.9 10*3/uL (ref 0.7–4.0)
MCV: 90.6 fl (ref 78.0–100.0)
Monocytes Absolute: 0.5 10*3/uL (ref 0.1–1.0)
RBC: 4.28 Mil/uL (ref 3.87–5.11)
WBC: 5.4 10*3/uL (ref 4.5–10.5)

## 2011-02-23 LAB — URINALYSIS, ROUTINE W REFLEX MICROSCOPIC
Leukocytes, UA: NEGATIVE
Specific Gravity, Urine: 1.01 (ref 1.000–1.030)
Urine Glucose: NEGATIVE
Urobilinogen, UA: 0.2 (ref 0.0–1.0)
pH: 6.5 (ref 5.0–8.0)

## 2011-02-23 LAB — BASIC METABOLIC PANEL
BUN: 16 mg/dL (ref 6–23)
Calcium: 9.7 mg/dL (ref 8.4–10.5)
GFR: 82.19 mL/min (ref >60.00)
Glucose, Bld: 88 mg/dL (ref 70–99)
Potassium: 3.5 mEq/L (ref 3.5–5.1)

## 2011-02-23 LAB — HEPATIC FUNCTION PANEL
Albumin: 4.2 g/dL (ref 3.5–5.2)
Alkaline Phosphatase: 58 U/L (ref 39–117)
Total Bilirubin: 0.5 mg/dL (ref 0.3–1.2)

## 2011-02-23 LAB — LIPID PANEL: Cholesterol: 181 mg/dL (ref 0–200)

## 2011-02-23 LAB — TSH: TSH: 1.8 u[IU]/mL (ref 0.35–5.50)

## 2011-02-26 ENCOUNTER — Ambulatory Visit (INDEPENDENT_AMBULATORY_CARE_PROVIDER_SITE_OTHER): Payer: BC Managed Care – PPO | Admitting: Internal Medicine

## 2011-02-26 ENCOUNTER — Encounter: Payer: Self-pay | Admitting: Internal Medicine

## 2011-02-26 VITALS — BP 120/80 | HR 65 | Temp 98.1°F | Ht 61.5 in | Wt 172.0 lb

## 2011-02-26 DIAGNOSIS — Z0000 Encounter for general adult medical examination without abnormal findings: Secondary | ICD-10-CM

## 2011-02-26 DIAGNOSIS — R600 Localized edema: Secondary | ICD-10-CM

## 2011-02-26 DIAGNOSIS — I10 Essential (primary) hypertension: Secondary | ICD-10-CM

## 2011-02-26 DIAGNOSIS — R609 Edema, unspecified: Secondary | ICD-10-CM

## 2011-02-26 HISTORY — DX: Localized edema: R60.0

## 2011-02-26 HISTORY — DX: Edema, unspecified: R60.9

## 2011-02-26 MED ORDER — LABETALOL HCL 200 MG PO TABS: 200.0000 mg | ORAL_TABLET | Freq: Two times a day (BID) | ORAL | Status: DC

## 2011-02-26 MED ORDER — PRAVASTATIN SODIUM 40 MG PO TABS: 40.0000 mg | ORAL_TABLET | Freq: Every day | ORAL | Status: DC

## 2011-02-26 MED ORDER — FUROSEMIDE 40 MG PO TABS: ORAL_TABLET | ORAL | Status: DC

## 2011-02-26 MED ORDER — AMLODIPINE-OLMESARTAN 5-40 MG PO TABS: 1.0000 | ORAL_TABLET | Freq: Every day | ORAL | Status: DC

## 2011-02-26 MED ORDER — SOLIFENACIN SUCCINATE 10 MG PO TABS: 10.0000 mg | ORAL_TABLET | Freq: Every day | ORAL | Status: DC

## 2011-02-26 NOTE — Patient Instructions (Addendum)
OK to stop the tribenzor, although -  OK To continue the tribenzor until the new medications come from [facility] Then start Azor 5/40 mg - 1 per day, and the Lasix 40 mg as directed (the stronger fluid pill) Continue all other medications as before You are otherwise up to date with prevention today Please remember to work on regular exercise, lower cholesterol diet, and weight control Please remember to followup with your GYN for the yearly pap smear and/or mammogram, as you do Please monitor your  Blood Pressure on a regular basis, and call for persistent elevation of the BP > 140/90 Please return in 1 year for your yearly visit, or sooner if needed, with Lab testing done 3-5 days before

## 2011-02-26 NOTE — Progress Notes (Signed)
Subjective:    Patient ID: Deborah Schultz, female    DOB: Jan 04, 1946, 65 y.o.   MRN: [id number]  HPI  Here for wellness and f/u;  Overall doing ok;  Pt denies CP, worsening SOB, DOE, wheezing, orthopnea, PND, worsening LE edema, palpitations, dizziness or syncope.  Pt denies neurological change such as new Headache, facial or extremity weakness.  Pt denies polydipsia, polyuria, or low sugar symptoms. Pt states overall good compliance with treatment and medications, good tolerability, and trying to follow lower cholesterol diet.  Pt denies worsening depressive symptoms, suicidal ideation or panic. No fever, wt loss, night sweats, loss of appetite, or other constitutional symptoms.  Pt states good ability with ADL's, low fall risk, home safety reviewed and adequate, no significant changes in hearing or vision, and occasionally active with exercise.  Qants to conisder change of the tribenzor due to persistnet swelling L>R ankles htough is better with change of the amlodipine part from 10 to 5 mg.   Past Medical History  Diagnosis Date  . Diverticulitis   . HLD (hyperlipidemia)   . HTN (hypertension)   . OAB (overactive bladder)   . Degenerative disc disease, cervical   . Right carpal tunnel syndrome   . Anemia     iron deficiency  . Chronic pruritus   . ANEMIA-IRON DEFICIENCY 06/06/2008  . BICEPS TENDINITIS, LEFT 05/06/2010  . BRONCHITIS, ACUTE 06/18/2008  . CALLUSES, FEET, BILATERAL 06/06/2008  . CONSTIPATION 06/06/2008  . ALOPECIA 06/06/2008  . Cough 06/18/2008  . DIVERTICULITIS, HX OF 06/06/2008  . ELBOW PAIN 05/26/2010  . FATIGUE 06/18/2008  . HYPERLIPIDEMIA 06/06/2008  . OVERACTIVE BLADDER 06/06/2008  . PERIPHERAL EDEMA 02/19/2009  . SINUSITIS- ACUTE-NOS 01/08/2009  . Peripheral edema 02/26/2011   Past Surgical History  Procedure Date  . Abdominal hysterectomy   . Oophorectomy   . Ectopic pregnancy surgery   . Appendectomy   . Breast biopsy   . Bunionectomy   . Right thumb surgery   .  Cataract extraction     bilateral 11/2009-no longer wears glasses    reports that she has never smoked. She does not have any smokeless tobacco history on file. She reports that she drinks alcohol. Her drug history not on file. family history includes Cancer in her maternal aunt and maternal uncle; Diabetes in her brother; Hypertension in her mother; and Ovarian cancer in her mother. Allergies  Allergen Reactions  . Esomeprazole Magnesium     REACTION: Headache  . Fexofenadine     REACTION: Hives  . Sulfonamide Derivatives    Current Outpatient Prescriptions on File Prior to Visit  Medication Sig Dispense Refill  . VITAMIN D, CHOLECALCIFEROL, PO Take by mouth daily.         Review of Systems Review of Systems  Constitutional: Negative for diaphoresis, activity change, appetite change and unexpected weight change.  HENT: Negative for hearing loss, ear pain, facial swelling, mouth sores and neck stiffness.   Eyes: Negative for pain, redness and visual disturbance.  Respiratory: Negative for shortness of breath and wheezing.   Cardiovascular: Negative for chest pain and palpitations.  Gastrointestinal: Negative for diarrhea, blood in stool, abdominal distention and rectal pain.  Genitourinary: Negative for hematuria, flank pain and decreased urine volume.  Musculoskeletal: Negative for myalgias and joint swelling.  Skin: Negative for color change and wound.  Neurological: Negative for syncope and numbness.  Hematological: Negative for adenopathy.  Psychiatric/Behavioral: Negative for hallucinations, self-injury, decreased concentration and agitation.      Objective:  Physical Exam BP 120/80  Pulse 65  Temp(Src) 98.1 F (36.7 C) (Oral)  Ht 5' 1.5" (1.562 m)  Wt 172 lb (78.019 kg)  BMI 31.97 kg/m2  SpO2 97% Physical Exam  VS noted Constitutional: Pt is oriented to person, place, and time. Appears well-developed and well-nourished.  HENT:  Head: Normocephalic and atraumatic.    Right Ear: External ear normal.  Left Ear: External ear normal.  Nose: Nose normal.  Mouth/Throat: Oropharynx is clear and moist.  Eyes: Conjunctivae and EOM are normal. Pupils are equal, round, and reactive to light.  Neck: Normal range of motion. Neck supple. No JVD present. No tracheal deviation present.  Cardiovascular: Normal rate, regular rhythm, normal heart sounds and intact distal pulses.   Pulmonary/Chest: Effort normal and breath sounds normal.  Abdominal: Soft. Bowel sounds are normal. There is no tenderness.  Musculoskeletal: Normal range of motion. Exhibits edema trace to 1+ bilat left > right.  Lymphadenopathy:  Has no cervical adenopathy.  Neurological: Pt is alert and oriented to person, place, and time. Pt has normal reflexes. No cranial nerve deficit.  Skin: Skin is warm and dry. No rash noted.  Psychiatric:  Has  normal mood and affect. Behavior is normal.     Assessment & Plan:

## 2011-02-26 NOTE — Assessment & Plan Note (Signed)
Due to mild persistent edema will change the tribenzor to azor; to cont to monitor BP at home, consider increase the labetolol to 300 bid if increased with change of hct to lasix,  to f/u any worsening symptoms or concerns

## 2011-02-26 NOTE — Assessment & Plan Note (Addendum)
Overall doing well, age appropriate education and counseling updated, referrals for preventative services and immunizations addressed, dietary and smoking counseling addressed, most recent labs and ECG reviewed.  I have personally reviewed and have noted: 1) the patient's medical and social history 2) The pt's use of alcohol, tobacco, and illicit drugs 3) The patient's current medications and supplements 4) Functional ability including ADL's, fall risk, home safety risk, hearing and visual impairment 5) Diet and physical activities 6) Evidence for depression or mood disorder 7) The patient's height, weight, and BMI have been recorded in the chart I have made referrals, and provided counseling and education based on review of the above Declines flu shot today, ECG reviewed as per emr

## 2011-02-26 NOTE — Assessment & Plan Note (Signed)
C/w with prob venous insuff most likely it seems; for d/c hctz, and start lasix 40 - 1/2 -1 qd prn

## 2011-03-29 ENCOUNTER — Emergency Department (INDEPENDENT_AMBULATORY_CARE_PROVIDER_SITE_OTHER)
Admission: EM | Admit: 2011-03-29 | Discharge: 2011-03-29 | Disposition: A | Discharge status: Home / Self Care | Payer: BC Managed Care – PPO | Source: Home / Self Care

## 2011-03-29 ENCOUNTER — Encounter (HOSPITAL_COMMUNITY): Payer: Self-pay | Admitting: *Deleted

## 2011-03-29 DIAGNOSIS — J069 Acute upper respiratory infection, unspecified: Secondary | ICD-10-CM

## 2011-03-29 NOTE — ED Notes (Signed)
Pt with congestion and cough onset Thursday - dry cough - taking otc meds without relief

## 2011-03-29 NOTE — ED Provider Notes (Signed)
History     CSN: [id number] Arrival date & time: 03/29/2011 12:34 PM   None     Chief Complaint  Patient presents with  . Nasal Congestion    pt with dry cough nasal congestion onset thursday taking otc meds without relief  denies fever   . Cough    (Consider location/radiation/quality/duration/timing/severity/associated sxs/prior treatment) HPI Comments: Pt feels is getting better  Patient is a 65 y.o. female presenting with cough. The history is provided by the patient.  Cough This is a new problem. Episode onset: 3 days ago. The problem occurs constantly. The problem has been gradually improving. The cough is non-productive. There has been no fever. Associated symptoms include ear congestion and rhinorrhea. Pertinent negatives include no chest pain, no chills, no sore throat, no myalgias, no shortness of breath and no wheezing. Treatments tried: robitussin. The treatment provided mild relief.    Past Medical History  Diagnosis Date  . Diverticulitis   . HLD (hyperlipidemia)   . HTN (hypertension)   . OAB (overactive bladder)   . Degenerative disc disease, cervical   . Right carpal tunnel syndrome   . Anemia     iron deficiency  . Chronic pruritus   . ANEMIA-IRON DEFICIENCY 06/06/2008  . BICEPS TENDINITIS, LEFT 05/06/2010  . BRONCHITIS, ACUTE 06/18/2008  . CALLUSES, FEET, BILATERAL 06/06/2008  . CONSTIPATION 06/06/2008  . ALOPECIA 06/06/2008  . Cough 06/18/2008  . DIVERTICULITIS, HX OF 06/06/2008  . ELBOW PAIN 05/26/2010  . FATIGUE 06/18/2008  . HYPERLIPIDEMIA 06/06/2008  . OVERACTIVE BLADDER 06/06/2008  . PERIPHERAL EDEMA 02/19/2009  . SINUSITIS- ACUTE-NOS 01/08/2009  . Peripheral edema 02/26/2011    Past Surgical History  Procedure Date  . Abdominal hysterectomy   . Oophorectomy   . Ectopic pregnancy surgery   . Appendectomy   . Breast biopsy   . Bunionectomy   . Right thumb surgery   . Cataract extraction     bilateral 11/2009-no longer wears glasses    Family History   Problem Relation Age of Onset  . Hypertension Mother   . Ovarian cancer Mother   . Diabetes Brother   . Cancer Maternal Aunt     unspecif if mat or pat  . Cancer Maternal Uncle     unspecif if mat or pat    History  Substance Use Topics  . Smoking status: Never Smoker   . Smokeless tobacco: Not on file  . Alcohol Use: Yes    OB History    Grav Para Term Preterm Abortions TAB SAB Ect Mult Living                  Review of Systems  Constitutional: Negative for fever and chills.  HENT: Positive for congestion, rhinorrhea and postnasal drip. Negative for sore throat and sinus pressure.   Respiratory: Positive for cough. Negative for shortness of breath and wheezing.   Cardiovascular: Negative for chest pain.  Musculoskeletal: Negative for myalgias.    Allergies  Esomeprazole magnesium; Fexofenadine; and Sulfonamide derivatives  Home Medications   Current Outpatient Rx  Name Route Sig Dispense Refill  . AMLODIPINE-OLMESARTAN 5-40 MG PO TABS Oral Take 1 tablet by mouth daily. 90 tablet 3  . ASPIRIN 81 MG PO TABS Oral Take 81 mg by mouth daily.      Marland Kitchen VICKS VAPORUB EX Apply externally Apply topically.      Marland Kitchen ESTRADIOL 1 MG PO TABS Oral Take 1 mg by mouth daily.      . FUROSEMIDE  40 MG PO TABS  1/2 - 1 tab  By mouth daily as needed for swelling 90 tablet 3  . GUAIFENESIN 100 MG/5ML PO LIQD Oral Take 200 mg by mouth 3 (three) times daily as needed.      Marland Kitchen LABETALOL HCL 200 MG PO TABS Oral Take 1 tablet (200 mg total) by mouth 2 (two) times daily. 180 tablet 3  . ONE-DAILY MULTI VITAMINS PO TABS Oral Take 1 tablet by mouth daily.      Marland Kitchen PRAVASTATIN SODIUM 40 MG PO TABS Oral Take 1 tablet (40 mg total) by mouth daily. 90 tablet 3  . SOLIFENACIN SUCCINATE 10 MG PO TABS Oral Take 1 tablet (10 mg total) by mouth daily. 90 tablet 3  . VITAMIN D (CHOLECALCIFEROL) PO Oral Take by mouth daily.        BP 127/63  Pulse 65  Temp(Src) 98.3 F (36.8 C) (Oral)  Resp 20  SpO2  99%  Physical Exam  Constitutional: She appears well-developed and well-nourished. No distress.  HENT:  Right Ear: Tympanic membrane, external ear and ear canal normal.  Left Ear: Tympanic membrane, external ear and ear canal normal.  Nose: Mucosal edema present. No rhinorrhea.  Mouth/Throat: Oropharynx is clear and moist and mucous membranes are normal.  Cardiovascular: Normal rate and regular rhythm.   Pulmonary/Chest: Effort normal and breath sounds normal.    ED Course  Procedures (including critical care time)  Labs Reviewed - No data to display No results found.   1. Upper respiratory infection       MDM          Cathlyn Parsons, NP 03/29/11 1317

## 2011-04-02 NOTE — ED Provider Notes (Signed)
Medical screening examination/treatment/procedure(s) were performed by non-physician practitioner and as supervising physician I was immediately available for consultation/collaboration.   KINDL,JAMES DOUGLAS MD.    James Douglas Kindl, MD 04/02/11 1435 

## 2011-04-23 ENCOUNTER — Other Ambulatory Visit: Payer: Self-pay | Admitting: Internal Medicine

## 2011-05-06 ENCOUNTER — Other Ambulatory Visit: Payer: Self-pay

## 2011-05-06 DIAGNOSIS — I10 Essential (primary) hypertension: Secondary | ICD-10-CM

## 2011-05-06 MED ORDER — PRAVASTATIN SODIUM 40 MG PO TABS: 40.0000 mg | ORAL_TABLET | Freq: Every day | ORAL | Status: DC

## 2011-05-18 ENCOUNTER — Other Ambulatory Visit: Payer: Self-pay

## 2011-05-18 DIAGNOSIS — I10 Essential (primary) hypertension: Secondary | ICD-10-CM

## 2011-05-18 MED ORDER — PRAVASTATIN SODIUM 40 MG PO TABS: 40.0000 mg | ORAL_TABLET | Freq: Every day | ORAL | Status: DC

## 2011-08-11 ENCOUNTER — Other Ambulatory Visit (HOSPITAL_COMMUNITY): Payer: Self-pay | Admitting: Obstetrics & Gynecology

## 2011-08-11 DIAGNOSIS — Z1231 Encounter for screening mammogram for malignant neoplasm of breast: Secondary | ICD-10-CM

## 2011-09-18 ENCOUNTER — Ambulatory Visit (HOSPITAL_COMMUNITY)
Admission: RE | Admit: 2011-09-18 | Discharge: 2011-09-18 | Disposition: A | Discharge status: Home / Self Care | Payer: BC Managed Care – PPO | Source: Ambulatory Visit | Attending: Obstetrics & Gynecology | Admitting: Obstetrics & Gynecology

## 2011-09-18 DIAGNOSIS — Z1231 Encounter for screening mammogram for malignant neoplasm of breast: Secondary | ICD-10-CM

## 2011-10-12 ENCOUNTER — Telehealth: Payer: Self-pay | Admitting: Internal Medicine

## 2011-10-12 NOTE — Telephone Encounter (Signed)
Please advise regarding appt

## 2011-10-12 NOTE — Telephone Encounter (Signed)
Ok for OV tues June 25

## 2011-10-12 NOTE — Telephone Encounter (Signed)
Caller: Astryd/Patient; PCP: Oliver Barre; CB#: [phone];  Call regarding bilateral ankle and hand swelling onset spring 2012, BP meds changed in November 2012.  Continues with swelling, pitting edema.   Monitors her BP, states it fluctuates from 140s over 70s  to 110s over 60s.  Emergent sx negative.  Care advice per "Edema, Atraumatic" with appt advised within 24h due to "swelling of legs that does not resolve with rest and elevation of legs."  No available appts in EPIC for today/tomorrow (06/24 or 06/25).   OFFICE PLEASE CALL [facility] AND ASSIST WITH APPT.  STATES "I CAN WAIT UNTIL TOMORROW."  THANKS.

## 2011-10-13 ENCOUNTER — Ambulatory Visit (INDEPENDENT_AMBULATORY_CARE_PROVIDER_SITE_OTHER): Payer: BC Managed Care – PPO | Admitting: Internal Medicine

## 2011-10-13 ENCOUNTER — Encounter: Payer: Self-pay | Admitting: Internal Medicine

## 2011-10-13 VITALS — BP 140/92 | HR 64 | Temp 97.5°F | Ht 61.0 in | Wt 173.2 lb

## 2011-10-13 DIAGNOSIS — I10 Essential (primary) hypertension: Secondary | ICD-10-CM

## 2011-10-13 DIAGNOSIS — R609 Edema, unspecified: Secondary | ICD-10-CM

## 2011-10-13 DIAGNOSIS — E785 Hyperlipidemia, unspecified: Secondary | ICD-10-CM

## 2011-10-13 MED ORDER — FUROSEMIDE 40 MG PO TABS: ORAL_TABLET | ORAL | Status: DC

## 2011-10-13 NOTE — Progress Notes (Signed)
Subjective:    Patient ID: Deborah Schultz, female    DOB: [birth year], 66 y.o.   MRN: [id number]  HPI  Pt denies chest pain, increased sob or doe, wheezing, orthopnea, PND,  palpitations, dizziness or syncope but has mild increased general swelling of the legs below the knees in the last month, better in the AM and elevation of the legs;  Tries to walk daily up to 5 miles per day, no claudication symptoms.  Overall good compliance with treatment, and good medicine tolerability.  Dec 2012 labs and ecg reviewed with pt;  BP seems up and down, hard to say if more high or low.  Pt denies new neurological symptoms such as new headache, or facial or extremity weakness or numbness   Pt denies polydipsia, polyuria.  No calf pain or hx of DVT.   Pt denies fever, wt loss, night sweats, loss of appetite, or other constitutional symptoms No hx of CKD. No hx of proteinuria.  Had GYN eval recently.  Drinks fluids up to 8 glasses water per day.  Does not use extra salt. Past Medical History  Diagnosis Date  . Diverticulitis   . HLD (hyperlipidemia)   . HTN (hypertension)   . OAB (overactive bladder)   . Degenerative disc disease, cervical   . Right carpal tunnel syndrome   . Anemia     iron deficiency  . Chronic pruritus   . ANEMIA-IRON DEFICIENCY 06/06/2008  . BICEPS TENDINITIS, LEFT 05/06/2010  . BRONCHITIS, ACUTE 06/18/2008  . CALLUSES, FEET, BILATERAL 06/06/2008  . CONSTIPATION 06/06/2008  . ALOPECIA 06/06/2008  . Cough 06/18/2008  . DIVERTICULITIS, HX OF 06/06/2008  . ELBOW PAIN 05/26/2010  . FATIGUE 06/18/2008  . HYPERLIPIDEMIA 06/06/2008  . OVERACTIVE BLADDER 06/06/2008  . PERIPHERAL EDEMA 02/19/2009  . SINUSITIS- ACUTE-NOS 01/08/2009  . Peripheral edema 02/26/2011   Past Surgical History  Procedure Date  . Abdominal hysterectomy   . Oophorectomy   . Ectopic pregnancy surgery   . Appendectomy   . Breast biopsy   . Bunionectomy   . Right thumb surgery   . Cataract extraction     bilateral 11/2009-no  longer wears glasses    reports that she has never smoked. She does not have any smokeless tobacco history on file. She reports that she drinks alcohol. Her drug history not on file. family history includes Cancer in her maternal aunt and maternal uncle; Diabetes in her brother; Hypertension in her mother; and Ovarian cancer in her mother. Allergies  Allergen Reactions  . Esomeprazole Magnesium     REACTION: Headache  . Fexofenadine     REACTION: Hives  . Sulfonamide Derivatives    Review of Systems Constitutional: Negative for diaphoresis and unexpected weight change.  HENT: Negative for drooling and tinnitus.   Eyes: Negative for photophobia and visual disturbance.  Respiratory: Negative for choking and stridor.   Gastrointestinal: Negative for vomiting and blood in stool.  Genitourinary: Negative for hematuria and decreased urine volume.  Musculoskeletal: Negative for gait problem.  Skin: Negative for color change and wound.  Neurological: Negative for tremors and numbness.  Psychiatric/Behavioral: Negative for decreased concentration. The patient is not hyperactive.       Objective:   Physical Exam BP 140/92  Pulse 64  Temp 97.5 F (36.4 C) (Oral)  Ht 5\' 1"  (1.549 m)  Wt 173 lb 4 oz (78.586 kg)  BMI 32.74 kg/m2  SpO2 97% Physical Exam  VS noted Constitutional: Pt appears well-developed and well-nourished.  HENT: Head:  Normocephalic.  Right Ear: External ear normal.  Left Ear: External ear normal.  Eyes: Conjunctivae and EOM are normal. Pupils are equal, round, and reactive to light.  Neck: Normal range of motion. Neck supple.  Cardiovascular: Normal rate and regular rhythm.   Pulmonary/Chest: Effort normal and breath sounds normal.  Neurological: Pt is alert. Not confused Skin: Skin is warm. No erythema. trace to 1+ edema to mid legs, no knee effusions Psychiatric: Pt behavior is normal. Thought content normal.     Assessment & Plan:

## 2011-10-13 NOTE — Assessment & Plan Note (Addendum)
Mild worsening, suspect venous insuff, nov 2012 ecg reveiwed, will check echo /ro diast fxn, ok for increase lasix 40 bid with second dose per day prn swelling only, to also not force fluids up to the 8 glasses fluids she has been doing; will hold on further labs today, but for bmet in 3 wks on the incrased lasix

## 2011-10-13 NOTE — Patient Instructions (Addendum)
OK to increase the lasix 40 mg to twice per day with the second dose per day only if you need it for persistent swelling Continue all other medications as before Please only drink fluids every day if you are thirsty You will be contacted regarding the referral for: echocardiogram Please return in 3 weeks for a blood test to make sure the potassium does not get low You will be contacted by phone if any changes need to be made immediately.  Otherwise, you will receive a letter about your results with an explanation.

## 2011-10-13 NOTE — Telephone Encounter (Signed)
COMING TODAY AT 1:30

## 2011-10-18 ENCOUNTER — Encounter: Payer: Self-pay | Admitting: Internal Medicine

## 2011-10-18 NOTE — Assessment & Plan Note (Signed)
stable overall by hx and exam, most recent data reviewed with pt, and pt to continue medical treatment as before BP Readings from Last 3 Encounters:  10/13/11 140/92  03/29/11 127/63  02/26/11 120/80

## 2011-10-18 NOTE — Assessment & Plan Note (Signed)
stable overall by hx and exam, most recent data reviewed with pt, and pt to continue medical treatment as before Lab Results  Component Value Date   LDLCALC 99 02/23/2011

## 2011-10-20 ENCOUNTER — Ambulatory Visit (HOSPITAL_COMMUNITY): Payer: BC Managed Care – PPO | Attending: Cardiology

## 2011-10-20 DIAGNOSIS — R609 Edema, unspecified: Secondary | ICD-10-CM | POA: Insufficient documentation

## 2011-10-20 DIAGNOSIS — I10 Essential (primary) hypertension: Secondary | ICD-10-CM | POA: Insufficient documentation

## 2011-10-20 DIAGNOSIS — E785 Hyperlipidemia, unspecified: Secondary | ICD-10-CM | POA: Insufficient documentation

## 2011-10-20 DIAGNOSIS — R5383 Other fatigue: Secondary | ICD-10-CM | POA: Insufficient documentation

## 2011-10-20 DIAGNOSIS — I319 Disease of pericardium, unspecified: Secondary | ICD-10-CM | POA: Insufficient documentation

## 2011-10-20 DIAGNOSIS — R5381 Other malaise: Secondary | ICD-10-CM | POA: Insufficient documentation

## 2011-10-20 NOTE — Progress Notes (Signed)
Echocardiogram performed.  

## 2011-10-26 ENCOUNTER — Encounter: Payer: Self-pay | Admitting: Internal Medicine

## 2012-02-04 ENCOUNTER — Other Ambulatory Visit: Payer: Self-pay | Admitting: Internal Medicine

## 2012-02-22 ENCOUNTER — Other Ambulatory Visit (INDEPENDENT_AMBULATORY_CARE_PROVIDER_SITE_OTHER): Payer: BC Managed Care – PPO

## 2012-02-22 DIAGNOSIS — Z0000 Encounter for general adult medical examination without abnormal findings: Secondary | ICD-10-CM

## 2012-02-22 LAB — CBC WITH DIFFERENTIAL/PLATELET
Basophils Absolute: 0 10*3/uL (ref 0.0–0.1)
Basophils Relative: 0.4 % (ref 0.0–3.0)
Eosinophils Absolute: 0.1 10*3/uL (ref 0.0–0.7)
Eosinophils Relative: 2.3 % (ref 0.0–5.0)
HCT: 37.8 % (ref 36.0–46.0)
Hemoglobin: 12.3 g/dL (ref 12.0–15.0)
Lymphocytes Relative: 37.8 % (ref 12.0–46.0)
Lymphs Abs: 2 10*3/uL (ref 0.7–4.0)
MCHC: 32.6 g/dL (ref 30.0–36.0)
MCV: 92.4 fl (ref 78.0–100.0)
Monocytes Absolute: 0.4 10*3/uL (ref 0.1–1.0)
Monocytes Relative: 7.5 % (ref 3.0–12.0)
Neutro Abs: 2.7 10*3/uL (ref 1.4–7.7)
Neutrophils Relative %: 52 % (ref 43.0–77.0)
Platelets: 260 10*3/uL (ref 150.0–400.0)
RBC: 4.09 Mil/uL (ref 3.87–5.11)
RDW: 14.1 % (ref 11.5–14.6)
WBC: 5.2 10*3/uL (ref 4.5–10.5)

## 2012-02-22 LAB — LIPID PANEL
HDL: 64.8 mg/dL (ref >39.00)
LDL Cholesterol: 94 mg/dL (ref 0–99)
Total CHOL/HDL Ratio: 3
Triglycerides: 90 mg/dL (ref 0.0–149.0)

## 2012-02-22 LAB — HEPATIC FUNCTION PANEL
Albumin: 4.1 g/dL (ref 3.5–5.2)
Alkaline Phosphatase: 52 U/L (ref 39–117)
Total Bilirubin: 0.7 mg/dL (ref 0.3–1.2)

## 2012-02-22 LAB — URINALYSIS, ROUTINE W REFLEX MICROSCOPIC
Bilirubin Urine: NEGATIVE
Ketones, ur: NEGATIVE
Leukocytes, UA: NEGATIVE
Urine Glucose: NEGATIVE
pH: 7.5 (ref 5.0–8.0)

## 2012-02-22 LAB — BASIC METABOLIC PANEL WITH GFR
BUN: 13 mg/dL (ref 6–23)
CO2: 28 meq/L (ref 19–32)
Calcium: 9.5 mg/dL (ref 8.4–10.5)
Chloride: 103 meq/L (ref 96–112)
Creatinine, Ser: 1.1 mg/dL (ref 0.4–1.2)
GFR: 67.7 mL/min
Glucose, Bld: 80 mg/dL (ref 70–99)
Potassium: 4.5 meq/L (ref 3.5–5.1)
Sodium: 138 meq/L (ref 135–145)

## 2012-02-29 ENCOUNTER — Ambulatory Visit (INDEPENDENT_AMBULATORY_CARE_PROVIDER_SITE_OTHER): Payer: BC Managed Care – PPO | Admitting: Internal Medicine

## 2012-02-29 ENCOUNTER — Encounter: Payer: Self-pay | Admitting: Internal Medicine

## 2012-02-29 VITALS — BP 140/70 | HR 65 | Temp 97.3°F | Ht 61.0 in | Wt 171.0 lb

## 2012-02-29 DIAGNOSIS — I701 Atherosclerosis of renal artery: Secondary | ICD-10-CM | POA: Insufficient documentation

## 2012-02-29 DIAGNOSIS — R609 Edema, unspecified: Secondary | ICD-10-CM

## 2012-02-29 DIAGNOSIS — I10 Essential (primary) hypertension: Secondary | ICD-10-CM

## 2012-02-29 DIAGNOSIS — Z0000 Encounter for general adult medical examination without abnormal findings: Secondary | ICD-10-CM

## 2012-02-29 DIAGNOSIS — R600 Localized edema: Secondary | ICD-10-CM

## 2012-02-29 DIAGNOSIS — Z23 Encounter for immunization: Secondary | ICD-10-CM

## 2012-02-29 MED ORDER — PRAVASTATIN SODIUM 40 MG PO TABS: 40.0000 mg | ORAL_TABLET | Freq: Every day | ORAL | Status: DC

## 2012-02-29 MED ORDER — ESTRADIOL 1 MG PO TABS: 1.0000 mg | ORAL_TABLET | Freq: Every day | ORAL | Status: DC

## 2012-02-29 MED ORDER — AMLODIPINE-OLMESARTAN 5-40 MG PO TABS: 1.0000 | ORAL_TABLET | Freq: Every day | ORAL | Status: DC

## 2012-02-29 MED ORDER — PROPRANOLOL HCL ER 80 MG PO CP24: 80.0000 mg | ORAL_CAPSULE | Freq: Every day | ORAL | Status: DC

## 2012-02-29 MED ORDER — FUROSEMIDE 40 MG PO TABS: ORAL_TABLET | ORAL | Status: DC

## 2012-02-29 MED ORDER — SOLIFENACIN SUCCINATE 10 MG PO TABS: 10.0000 mg | ORAL_TABLET | Freq: Every day | ORAL | Status: DC

## 2012-02-29 NOTE — Assessment & Plan Note (Signed)

## 2012-02-29 NOTE — Assessment & Plan Note (Signed)
Ok to change the labetolol back to the inderal la 80 per pt request to try this

## 2012-02-29 NOTE — Patient Instructions (Addendum)
You had the flu shot today OK to change the labetolol to inderal LA 80 mg per day Your prescriptions were done hardcopy Continue all other medications as before Please have the pharmacy call with any refills you may need. Please remember to sign up for My Chart at your earliest convenience, as this will be important to you in the future with finding out test results. Please continue your efforts at being more active, low cholesterol diet, and weight control. You are otherwise up to date with prevention You will be contacted regarding the referral for: renal artery ultrasound  Please continue to monitor your Blood Pressures at home as you do Please return in 6 months, or sooner if needed

## 2012-02-29 NOTE — Progress Notes (Signed)
Subjective:    Patient ID: Deborah Schultz, female    DOB: 09/29/1945, 66 y.o.   MRN: [id number]  HPIn Here for wellness and f/u;  Overall doing ok;  Pt denies CP, worsening SOB, DOE, wheezing, orthopnea, PND, worsening LE edema, palpitations, dizziness or syncope.  Pt denies neurological change such as new Headache, facial or extremity weakness.  Pt denies polydipsia, polyuria, or low sugar symptoms. Pt states overall good compliance with treatment and medications, good tolerability, and trying to follow lower cholesterol diet.  Pt denies worsening depressive symptoms, suicidal ideation or panic. No fever, wt loss, night sweats, loss of appetite, or other constitutional symptoms.  Pt states good ability with ADL's, low fall risk, home safety reviewed and adequate, no significant changes in hearing or vision, and occasionally active with exercise.  Recent echo with normal EF. BP's have been somewhat variable in the past month  - 140/73, 128/70.  This AM 144/75 at home before coming here today.  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  Currently on azor/labetolol/lasix.  Was on inderal some yrs ago with better control per pt.  Edema improved from last visit after azor reduced from 10/40 to 5/40  Past Medical History  Diagnosis Date  . Diverticulitis   . HLD (hyperlipidemia)   . HTN (hypertension)   . OAB (overactive bladder)   . Degenerative disc disease, cervical   . Right carpal tunnel syndrome   . Anemia     iron deficiency  . Chronic pruritus   . ANEMIA-IRON DEFICIENCY 06/06/2008  . BICEPS TENDINITIS, LEFT 05/06/2010  . BRONCHITIS, ACUTE 06/18/2008  . CALLUSES, FEET, BILATERAL 06/06/2008  . CONSTIPATION 06/06/2008  . ALOPECIA 06/06/2008  . Cough 06/18/2008  . DIVERTICULITIS, HX OF 06/06/2008  . ELBOW PAIN 05/26/2010  . FATIGUE 06/18/2008  . HYPERLIPIDEMIA 06/06/2008  . OVERACTIVE BLADDER 06/06/2008  . PERIPHERAL EDEMA 02/19/2009  . SINUSITIS- ACUTE-NOS 01/08/2009  .  Peripheral edema 02/26/2011   Past Surgical History  Procedure Date  . Abdominal hysterectomy   . Oophorectomy   . Ectopic pregnancy surgery   . Appendectomy   . Breast biopsy   . Bunionectomy   . Right thumb surgery   . Cataract extraction     bilateral 11/2009-no longer wears glasses    reports that she has never smoked. She does not have any smokeless tobacco history on file. She reports that she drinks alcohol. Her drug history not on file. family history includes Cancer in her maternal aunt and maternal uncle; Diabetes in her brother; Hypertension in her mother; and Ovarian cancer in her mother. Allergies  Allergen Reactions  . Esomeprazole Magnesium     REACTION: Headache  . Fexofenadine     REACTION: Hives  . Sulfonamide Derivatives    Current Outpatient Prescriptions on File Prior to Visit  Medication Sig Dispense Refill  . aspirin 81 MG tablet Take 81 mg by mouth daily.        . AZOR 5-40 MG per tablet TAKE 1 TABLET DAILY  90 tablet  1  . Camphor-Eucalyptus-Menthol (VICKS VAPORUB EX) Apply topically.        Marland Kitchen estradiol (ESTRACE) 1 MG tablet Take 1 mg by mouth daily.        . furosemide (LASIX) 40 MG tablet 1 tab  By mouth twice per day, with the second dose per day as needed only for persistent swelling  180 tablet  3  . guaiFENesin (ROBITUSSIN) 100 MG/5ML liquid Take 200  mg by mouth 3 (three) times daily as needed.        . labetalol (NORMODYNE) 200 MG tablet Take 1 tablet (200 mg total) by mouth 2 (two) times daily.  180 tablet  3  . Multiple Vitamin (MULTIVITAMIN) tablet Take 1 tablet by mouth daily.        . pravastatin (PRAVACHOL) 40 MG tablet Take 1 tablet (40 mg total) by mouth daily.  90 tablet  3  . solifenacin (VESICARE) 10 MG tablet Take 1 tablet (10 mg total) by mouth daily.  90 tablet  3  . VITAMIN D, CHOLECALCIFEROL, PO Take by mouth daily.         Review of Systems Review of Systems  Constitutional: Negative for diaphoresis, activity change, appetite  change and unexpected weight change.  HENT: Negative for hearing loss, ear pain, facial swelling, mouth sores and neck stiffness.   Eyes: Negative for pain, redness and visual disturbance.  Respiratory: Negative for shortness of breath and wheezing.   Cardiovascular: Negative for chest pain and palpitations.  Gastrointestinal: Negative for diarrhea, blood in stool, abdominal distention and rectal pain.  Genitourinary: Negative for hematuria, flank pain and decreased urine volume.  Musculoskeletal: Negative for myalgias and joint swelling.  Skin: Negative for color change and wound.  Neurological: Negative for syncope and numbness.  Hematological: Negative for adenopathy.  Psychiatric/Behavioral: Negative for hallucinations, self-injury, decreased concentration and agitation.     Objective:   Physical Exam BP 140/70  Pulse 65  Temp 97.3 F (36.3 C) (Oral)  Ht 5\' 1"  (1.549 m)  Wt 171 lb (77.565 kg)  BMI 32.31 kg/m2  SpO2 97% Physical Exam  VS noted Constitutional: Pt is oriented to person, place, and time. Appears well-developed and well-nourished.  HENT:  Head: Normocephalic and atraumatic.  Right Ear: External ear normal.  Left Ear: External ear normal.  Nose: Nose normal.  Mouth/Throat: Oropharynx is clear and moist.  Eyes: Conjunctivae and EOM are normal. Pupils are equal, round, and reactive to light.  Neck: Normal range of motion. Neck supple. No JVD present. No tracheal deviation present.  Cardiovascular: Normal rate, regular rhythm, normal heart sounds and intact distal pulses.   Pulmonary/Chest: Effort normal and breath sounds normal.  Abdominal: Soft. Bowel sounds are normal. There is no tenderness.  Musculoskeletal: Normal range of motion. Exhibits no edema.  Lymphadenopathy:  Has no cervical adenopathy.  Neurological: Pt is alert and oriented to person, place, and time. Pt has normal reflexes. No cranial nerve deficit.  Skin: Skin is warm and dry. No rash noted.  No  LE edema Psychiatric:  Has  normal mood and affect. Behavior is normal.     Assessment & Plan:

## 2012-02-29 NOTE — Assessment & Plan Note (Addendum)
Improved with reduction amlodipine in her med list, echo with EF 55-60%

## 2012-02-29 NOTE — Assessment & Plan Note (Signed)
Ok for f/u - will order

## 2012-03-14 ENCOUNTER — Encounter (INDEPENDENT_AMBULATORY_CARE_PROVIDER_SITE_OTHER): Payer: BC Managed Care – PPO

## 2012-03-14 DIAGNOSIS — I700 Atherosclerosis of aorta: Secondary | ICD-10-CM

## 2012-03-14 DIAGNOSIS — I701 Atherosclerosis of renal artery: Secondary | ICD-10-CM

## 2012-03-16 ENCOUNTER — Encounter: Payer: Self-pay | Admitting: Internal Medicine

## 2012-08-23 ENCOUNTER — Other Ambulatory Visit: Payer: Self-pay | Admitting: Internal Medicine

## 2012-08-23 DIAGNOSIS — Z1231 Encounter for screening mammogram for malignant neoplasm of breast: Secondary | ICD-10-CM

## 2012-08-29 ENCOUNTER — Ambulatory Visit (INDEPENDENT_AMBULATORY_CARE_PROVIDER_SITE_OTHER): Payer: Medicare Other | Admitting: Internal Medicine

## 2012-08-29 ENCOUNTER — Encounter: Payer: Self-pay | Admitting: Internal Medicine

## 2012-08-29 VITALS — BP 148/88 | HR 60 | Temp 97.8°F | Ht 61.0 in | Wt 169.4 lb

## 2012-08-29 DIAGNOSIS — E785 Hyperlipidemia, unspecified: Secondary | ICD-10-CM

## 2012-08-29 DIAGNOSIS — Z0000 Encounter for general adult medical examination without abnormal findings: Secondary | ICD-10-CM

## 2012-08-29 DIAGNOSIS — I10 Essential (primary) hypertension: Secondary | ICD-10-CM

## 2012-08-29 DIAGNOSIS — R609 Edema, unspecified: Secondary | ICD-10-CM

## 2012-08-29 MED ORDER — AMLODIPINE BESYLATE 5 MG PO TABS: 5.0000 mg | ORAL_TABLET | Freq: Every day | ORAL | Status: DC

## 2012-08-29 MED ORDER — OXYBUTYNIN CHLORIDE 5 MG PO TABS: 5.0000 mg | ORAL_TABLET | Freq: Two times a day (BID) | ORAL | Status: DC

## 2012-08-29 MED ORDER — IRBESARTAN 300 MG PO TABS: 300.0000 mg | ORAL_TABLET | Freq: Every day | ORAL | Status: DC

## 2012-08-29 NOTE — Assessment & Plan Note (Signed)
stable overall by history and exam, recent data reviewed with pt, and pt to continue medical treatment as before,  to f/u any worsening symptoms or concerns Lab Results  Component Value Date   WBC 5.2 02/22/2012   HGB 12.3 02/22/2012   HCT 37.8 02/22/2012   PLT 260.0 02/22/2012   GLUCOSE 80 02/22/2012   CHOL 177 02/22/2012   TRIG 90.0 02/22/2012   HDL 64.80 02/22/2012   LDLCALC 94 02/22/2012   ALT 22 02/22/2012   AST 22 02/22/2012   NA 138 02/22/2012   K 4.5 02/22/2012   CL 103 02/22/2012   CREATININE 1.1 02/22/2012   BUN 13 02/22/2012   CO2 28 02/22/2012   TSH 2.65 02/22/2012

## 2012-08-29 NOTE — Assessment & Plan Note (Signed)
stable overall by history and exam, recent data reviewed with pt, and pt to continue medical treatment as before,  to f/u any worsening symptoms or concerns Lab Results  Component Value Date   LDLCALC 94 02/22/2012    

## 2012-08-29 NOTE — Progress Notes (Signed)
Subjective:    Patient ID: Deborah Schultz, female    DOB: [birth year], 67 y.o.   MRN: [id number]  HPI   Here to f/u; overall doing ok,  Pt denies chest pain, increased sob or doe, wheezing, orthopnea, PND, increased LE swelling, palpitations, dizziness or syncope.  Pt denies polydipsia, polyuria, or low sugar symptoms such as weakness or confusion improved with po intake.  Pt denies new neurological symptoms such as new headache, or facial or extremity weakness or numbness.   Pt states overall good compliance with meds, has been trying to follow lower cholesterol diet, with wt overall stable.  BP at home < 140/90, though does "fluctuate" occasionally with sbp up to 157 at home.  Has not been excercising last 2-3 wks due to people ill in the family that takes her time.  Vesicare and azor now very expensive with her current insurance. Past Medical History  Diagnosis Date  . Diverticulitis   . HLD (hyperlipidemia)   . HTN (hypertension)   . OAB (overactive bladder)   . Degenerative disc disease, cervical   . Right carpal tunnel syndrome   . Anemia     iron deficiency  . Chronic pruritus   . ANEMIA-IRON DEFICIENCY 06/06/2008  . BICEPS TENDINITIS, LEFT 05/06/2010  . BRONCHITIS, ACUTE 06/18/2008  . CALLUSES, FEET, BILATERAL 06/06/2008  . CONSTIPATION 06/06/2008  . ALOPECIA 06/06/2008  . Cough 06/18/2008  . DIVERTICULITIS, HX OF 06/06/2008  . ELBOW PAIN 05/26/2010  . FATIGUE 06/18/2008  . HYPERLIPIDEMIA 06/06/2008  . OVERACTIVE BLADDER 06/06/2008  . PERIPHERAL EDEMA 02/19/2009  . SINUSITIS- ACUTE-NOS 01/08/2009  . Peripheral edema 02/26/2011   Past Surgical History  Procedure Laterality Date  . Abdominal hysterectomy    . Oophorectomy    . Ectopic pregnancy surgery    . Appendectomy    . Breast biopsy    . Bunionectomy    . Right thumb surgery    . Cataract extraction      bilateral 11/2009-no longer wears glasses    reports that she has never smoked. She does not have any smokeless tobacco  history on file. She reports that  drinks alcohol. Her drug history is not on file. family history includes Cancer in her maternal aunt and maternal uncle; Diabetes in her brother; Hypertension in her mother; and Ovarian cancer in her mother. Allergies  Allergen Reactions  . Esomeprazole Magnesium     REACTION: Headache  . Fexofenadine     REACTION: Hives  . Sulfonamide Derivatives    Current Outpatient Prescriptions on File Prior to Visit  Medication Sig Dispense Refill  . aspirin 81 MG tablet Take 81 mg by mouth daily.        Marland Kitchen estradiol (ESTRACE) 1 MG tablet Take 1 tablet (1 mg total) by mouth daily.  90 tablet  3  . furosemide (LASIX) 40 MG tablet 1 tab  By mouth twice per day, with the second dose per day as needed only for persistent swelling  180 tablet  3  . Multiple Vitamin (MULTIVITAMIN) tablet Take 1 tablet by mouth daily.        . pravastatin (PRAVACHOL) 40 MG tablet Take 1 tablet (40 mg total) by mouth daily.  90 tablet  3  . propranolol ER (INDERAL LA) 80 MG 24 hr capsule Take 1 capsule (80 mg total) by mouth daily.  90 capsule  3  . VITAMIN D, CHOLECALCIFEROL, PO Take by mouth daily.        . [DISCONTINUED] solifenacin (  VESICARE) 10 MG tablet Take 1 tablet (10 mg total) by mouth daily.  90 tablet  3   No current facility-administered medications on file prior to visit.   Review of Systems  Constitutional: Negative for unexpected weight change, or unusual diaphoresis  HENT: Negative for tinnitus.   Eyes: Negative for photophobia and visual disturbance.  Respiratory: Negative for choking and stridor.   Gastrointestinal: Negative for vomiting and blood in stool.  Genitourinary: Negative for hematuria and decreased urine volume.  Musculoskeletal: Negative for acute joint swelling Skin: Negative for color change and wound.  Neurological: Negative for tremors and numbness other than noted  Psychiatric/Behavioral: Negative for decreased concentration or  hyperactivity.        Objective:   Physical Exam BP 148/88  Pulse 60  Temp(Src) 97.8 F (36.6 C) (Oral)  Ht 5\' 1"  (1.549 m)  Wt 169 lb 6 oz (76.828 kg)  BMI 32.02 kg/m2  SpO2 97% VS noted,  Constitutional: Pt appears well-developed and well-nourished.  HENT: Head: NCAT.  Right Ear: External ear normal.  Left Ear: External ear normal.  Eyes: Conjunctivae and EOM are normal. Pupils are equal, round, and reactive to light.  Neck: Normal range of motion. Neck supple.  Cardiovascular: Normal rate and regular rhythm.   Pulmonary/Chest: Effort normal and breath sounds normal. - no rales or wheezing  Abd:  Soft, NT, non-distended, + BS Neurological: Pt is alert. Not confused  Skin: Skin is warm. No erythema. No LE edema Psychiatric: Pt behavior is normal. Thought content normal.     Assessment & Plan:

## 2012-08-29 NOTE — Assessment & Plan Note (Signed)
stable overall by history and exam, recent data reviewed with pt, and pt to continue medical treatment as before,  to f/u any worsening symptoms or concerns BP Readings from Last 3 Encounters:  08/29/12 148/88  02/29/12 140/70  10/13/11 140/92

## 2012-08-29 NOTE — Patient Instructions (Addendum)
OK to stop the vesicare and azor Please take all new medication as prescribed  - the oxybutinin 5 mg twice per day (for bladder) and the generic Norvasc and generic Avapro (which take the place of the Azor) - given in hardcopy Please continue all other medications as before, and refills have been done if requested. Please have the pharmacy call with any other refills you may need. Please try wearing the compression stockings during the day, and remember a low salt diet, and leg elevation when you are sitting Please return in 6 months, or sooner if needed, with Lab testing done 3-5 days before

## 2012-09-08 ENCOUNTER — Encounter: Payer: Self-pay | Admitting: Internal Medicine

## 2012-09-13 ENCOUNTER — Encounter: Payer: Self-pay | Admitting: Internal Medicine

## 2012-09-19 ENCOUNTER — Ambulatory Visit (HOSPITAL_COMMUNITY)
Admission: RE | Admit: 2012-09-19 | Discharge: 2012-09-19 | Disposition: A | Discharge status: Home / Self Care | Payer: Medicare Other | Source: Ambulatory Visit | Attending: Internal Medicine | Admitting: Internal Medicine

## 2012-09-19 DIAGNOSIS — Z1231 Encounter for screening mammogram for malignant neoplasm of breast: Secondary | ICD-10-CM

## 2012-09-23 LAB — HM MAMMOGRAPHY

## 2012-10-03 ENCOUNTER — Ambulatory Visit (INDEPENDENT_AMBULATORY_CARE_PROVIDER_SITE_OTHER): Payer: Medicare Other | Admitting: Internal Medicine

## 2012-10-03 ENCOUNTER — Encounter: Payer: Self-pay | Admitting: Internal Medicine

## 2012-10-03 VITALS — BP 120/74 | HR 62 | Temp 98.3°F | Ht 61.0 in | Wt 172.4 lb

## 2012-10-03 DIAGNOSIS — I10 Essential (primary) hypertension: Secondary | ICD-10-CM

## 2012-10-03 DIAGNOSIS — J069 Acute upper respiratory infection, unspecified: Secondary | ICD-10-CM

## 2012-10-03 DIAGNOSIS — E785 Hyperlipidemia, unspecified: Secondary | ICD-10-CM

## 2012-10-03 MED ORDER — HYDROCODONE-HOMATROPINE 5-1.5 MG/5ML PO SYRP: 5.0000 mL | ORAL_SOLUTION | Freq: Four times a day (QID) | ORAL | Status: DC | PRN

## 2012-10-03 MED ORDER — AZITHROMYCIN 250 MG PO TABS: ORAL_TABLET | ORAL | Status: DC

## 2012-10-03 NOTE — Progress Notes (Signed)
Subjective:    Patient ID: Deborah Schultz, female    DOB: 1946/01/09, 67 y.o.   MRN: [id number]  HPI  Here with 2-3 days acute onset fever, bilat ear pressure, facial pain, headache, general weakness and malaise, and greenish d/c, with mild ST and cough, but pt denies chest pain, wheezing, increased sob or doe, orthopnea, PND, increased LE swelling, palpitations, dizziness or syncope.   Pt denies polydipsia, polyuria, or low sugar symptoms such as weakness or confusion improved with po intake.  Pt states overall good compliance with meds, trying to follow lower cholesterol, diet, wt overall stable. Past Medical History  Diagnosis Date  . Diverticulitis   . HLD (hyperlipidemia)   . HTN (hypertension)   . OAB (overactive bladder)   . Degenerative disc disease, cervical   . Right carpal tunnel syndrome   . Anemia     iron deficiency  . Chronic pruritus   . ANEMIA-IRON DEFICIENCY 06/06/2008  . BICEPS TENDINITIS, LEFT 05/06/2010  . BRONCHITIS, ACUTE 06/18/2008  . CALLUSES, FEET, BILATERAL 06/06/2008  . CONSTIPATION 06/06/2008  . ALOPECIA 06/06/2008  . Cough 06/18/2008  . DIVERTICULITIS, HX OF 06/06/2008  . ELBOW PAIN 05/26/2010  . FATIGUE 06/18/2008  . HYPERLIPIDEMIA 06/06/2008  . OVERACTIVE BLADDER 06/06/2008  . PERIPHERAL EDEMA 02/19/2009  . SINUSITIS- ACUTE-NOS 01/08/2009  . Peripheral edema 02/26/2011   Past Surgical History  Procedure Laterality Date  . Abdominal hysterectomy    . Oophorectomy    . Ectopic pregnancy surgery    . Appendectomy    . Breast biopsy    . Bunionectomy    . Right thumb surgery    . Cataract extraction      bilateral 11/2009-no longer wears glasses    reports that she has never smoked. She does not have any smokeless tobacco history on file. She reports that  drinks alcohol. Her drug history is not on file. family history includes Cancer in her maternal aunt and maternal uncle; Diabetes in her brother; Hypertension in her mother; and Ovarian cancer in her  mother. Allergies  Allergen Reactions  . Esomeprazole Magnesium     REACTION: Headache  . Fexofenadine     REACTION: Hives  . Sulfonamide Derivatives    Current Outpatient Prescriptions on File Prior to Visit  Medication Sig Dispense Refill  . amLODipine (NORVASC) 5 MG tablet Take 1 tablet (5 mg total) by mouth daily.  90 tablet  3  . aspirin 81 MG tablet Take 81 mg by mouth daily.        Marland Kitchen estradiol (ESTRACE) 1 MG tablet Take 1 tablet (1 mg total) by mouth daily.  90 tablet  3  . furosemide (LASIX) 40 MG tablet 1 tab  By mouth twice per day, with the second dose per day as needed only for persistent swelling  180 tablet  3  . irbesartan (AVAPRO) 300 MG tablet Take 1 tablet (300 mg total) by mouth at bedtime.  90 tablet  3  . Multiple Vitamin (MULTIVITAMIN) tablet Take 1 tablet by mouth daily.        Marland Kitchen oxybutynin (DITROPAN) 5 MG tablet Take 1 tablet (5 mg total) by mouth 2 (two) times daily.  180 tablet  3  . pravastatin (PRAVACHOL) 40 MG tablet Take 1 tablet (40 mg total) by mouth daily.  90 tablet  3  . propranolol ER (INDERAL LA) 80 MG 24 hr capsule Take 1 capsule (80 mg total) by mouth daily.  90 capsule  3  .  VITAMIN D, CHOLECALCIFEROL, PO Take by mouth daily.        . [DISCONTINUED] solifenacin (VESICARE) 10 MG tablet Take 1 tablet (10 mg total) by mouth daily.  90 tablet  3   No current facility-administered medications on file prior to visit.   Review of Systems  Constitutional: Negative for unexpected weight change, or unusual diaphoresis  HENT: Negative for tinnitus.   Eyes: Negative for photophobia and visual disturbance.  Respiratory: Negative for choking and stridor.   Gastrointestinal: Negative for vomiting and blood in stool.  Genitourinary: Negative for hematuria and decreased urine volume.  Musculoskeletal: Negative for acute joint swelling Skin: Negative for color change and wound.  Neurological: Negative for tremors and numbness other than noted   Psychiatric/Behavioral: Negative for decreased concentration or  hyperactivity.       Objective:   Physical Exam BP 120/74  Pulse 62  Temp(Src) 98.3 F (36.8 C) (Oral)  Ht 5\' 1"  (1.549 m)  Wt 172 lb 6 oz (78.189 kg)  BMI 32.59 kg/m2  SpO2 97% VS noted, mild ill Constitutional: Pt appears well-developed and well-nourished.  HENT: Head: NCAT.  Right Ear: External ear normal.  Left Ear: External ear normal.  Bilat tm's with mild erythema.  Max sinus areas mild tender.  Pharynx with mild erythema, no exudate Eyes: Conjunctivae and EOM are normal. Pupils are equal, round, and reactive to light.  Neck: Normal range of motion. Neck supple.  Cardiovascular: Normal rate and regular rhythm.   Pulmonary/Chest: Effort normal and breath sounds normal.  Neurological: Pt is alert. Not confused  Skin: Skin is warm. No erythema.  Psychiatric: Pt behavior is normal. Thought content normal.     Assessment & Plan:

## 2012-10-03 NOTE — Assessment & Plan Note (Signed)
Mild to mod, for antibx course,  to f/u any worsening symptoms or concerns 

## 2012-10-03 NOTE — Assessment & Plan Note (Signed)
stable overall by history and exam, recent data reviewed with pt, and pt to continue medical treatment as before,  to f/u any worsening symptoms or concerns BP Readings from Last 3 Encounters:  10/03/12 120/74  08/29/12 148/88  02/29/12 140/70

## 2012-10-03 NOTE — Patient Instructions (Signed)
Please take all new medication as prescribed - the antibiotic, and cough medicine  Please continue all other medications as before, and refills have been done if requested. Please have the pharmacy call with any other refills you may need.  Please remember to sign up for MyChart if you have not done so, as this will be important to you in the future with finding out test results, communicating by private email, and scheduling acute appointments online when needed.   

## 2012-10-03 NOTE — Assessment & Plan Note (Signed)
stable overall by history and exam, recent data reviewed with pt, and pt to continue medical treatment as before,  to f/u any worsening symptoms or concerns Lab Results  Component Value Date   LDLCALC 94 02/22/2012

## 2012-10-06 ENCOUNTER — Telehealth: Payer: Self-pay | Admitting: Internal Medicine

## 2012-10-06 MED ORDER — BENZONATATE 100 MG PO CAPS: ORAL_CAPSULE | ORAL | Status: DC

## 2012-10-06 NOTE — Telephone Encounter (Signed)
Patient informed. 

## 2012-10-06 NOTE — Telephone Encounter (Signed)
Ok to try add tessalon perle prn

## 2012-10-06 NOTE — Telephone Encounter (Signed)
Pt is not any better. Cough is worse.  She can't sleep because of the cough.  Going down in the chest.  She has been taking her medicines.

## 2012-10-11 ENCOUNTER — Ambulatory Visit (INDEPENDENT_AMBULATORY_CARE_PROVIDER_SITE_OTHER): Payer: Medicare Other | Admitting: Internal Medicine

## 2012-10-11 ENCOUNTER — Encounter: Payer: Self-pay | Admitting: Internal Medicine

## 2012-10-11 VITALS — BP 148/92 | HR 73 | Temp 97.9°F | Ht 61.0 in | Wt 172.0 lb

## 2012-10-11 DIAGNOSIS — J019 Acute sinusitis, unspecified: Secondary | ICD-10-CM

## 2012-10-11 MED ORDER — AMOXICILLIN-POT CLAVULANATE 875-125 MG PO TABS: 1.0000 | ORAL_TABLET | Freq: Two times a day (BID) | ORAL | Status: DC

## 2012-10-11 NOTE — Progress Notes (Signed)
HPI  Pt presents to the clinic today with c/o ongoing cough, ear pain and nasal congestion since being treated for URI on 10/03/2012. She was treated with a zpack. The cough is unproductive. She is coughing at night, it is keeping her awake. She denies fever, chills or body aches. She feels like she has not gotten any better since the zpack. She has no history of allergies or asthma. She has had sick contacts.  Review of Systems      Past Medical History  Diagnosis Date  . Diverticulitis   . HLD (hyperlipidemia)   . HTN (hypertension)   . OAB (overactive bladder)   . Degenerative disc disease, cervical   . Right carpal tunnel syndrome   . Anemia     iron deficiency  . Chronic pruritus   . ANEMIA-IRON DEFICIENCY 06/06/2008  . BICEPS TENDINITIS, LEFT 05/06/2010  . BRONCHITIS, ACUTE 06/18/2008  . CALLUSES, FEET, BILATERAL 06/06/2008  . CONSTIPATION 06/06/2008  . ALOPECIA 06/06/2008  . Cough 06/18/2008  . DIVERTICULITIS, HX OF 06/06/2008  . ELBOW PAIN 05/26/2010  . FATIGUE 06/18/2008  . HYPERLIPIDEMIA 06/06/2008  . OVERACTIVE BLADDER 06/06/2008  . PERIPHERAL EDEMA 02/19/2009  . SINUSITIS- ACUTE-NOS 01/08/2009  . Peripheral edema 02/26/2011    Family History  Problem Relation Age of Onset  . Hypertension Mother   . Ovarian cancer Mother   . Diabetes Brother   . Cancer Maternal Aunt     unspecif if mat or pat  . Cancer Maternal Uncle     unspecif if mat or pat    History   Social History  . Marital Status: Married    Spouse Name: N/A    Number of Children: N/A  . Years of Education: N/A   Occupational History  . Not on file.   Social History Main Topics  . Smoking status: Never Smoker   . Smokeless tobacco: Not on file  . Alcohol Use: Yes  . Drug Use: Not on file  . Sexually Active: Not on file   Other Topics Concern  . 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     Allergies  Allergen Reactions  .  Esomeprazole Magnesium     REACTION: Headache  . Fexofenadine     REACTION: Hives  . Sulfonamide Derivatives      Constitutional: Positive headache, fatigue. Denies fever or abrupt weight changes.  HEENT:  Positive sore throat. Denies eye redness, eye pain, pressure behind the eyes, facial pain, nasal congestion, ear pain, ringing in the ears, wax buildup, runny nose or bloody nose. Respiratory: Positive cough. Denies difficulty breathing or shortness of breath.  Cardiovascular: Denies chest pain, chest tightness, palpitations or swelling in the hands or feet.   No other specific complaints in a complete review of systems (except as listed in HPI above).  Objective:   BP 148/92  Pulse 73  Temp(Src) 97.9 F (36.6 C) (Oral)  Ht 5\' 1"  (1.549 m)  Wt 172 lb (78.019 kg)  BMI 32.52 kg/m2  SpO2 99% Wt Readings from Last 3 Encounters:  10/11/12 172 lb (78.019 kg)  10/03/12 172 lb 6 oz (78.189 kg)  08/29/12 169 lb 6 oz (76.828 kg)     General: Appears her stated age, well developed, well nourished in NAD. HEENT: Head: normal shape and size; Eyes: sclera white, no icterus, conjunctiva pink, PERRLA and EOMs intact; Ears: Tm's gray and intact, normal light reflex; Nose: mucosa pink  and moist, septum midline; Throat/Mouth: + PND. Teeth present, mucosa erythematous and moist, no exudate noted, no lesions or ulcerations noted.  Neck: Mild cervical lymphadenopathy. Neck supple, trachea midline. No massses, lumps or thyromegaly present.  Cardiovascular: Normal rate and rhythm. S1,S2 noted.  No murmur, rubs or gallops noted. No JVD or BLE edema. No carotid bruits noted. Pulmonary/Chest: Normal effort and positive vesicular breath sounds. No respiratory distress. No wheezes, rales or ronchi noted.      Assessment & Plan:  Acute sinusitis, new onset:  Get some rest and drink plenty of water Do salt water gargles for the sore throat Try nyquil at night eRx for augmentin  RTC as needed or if  symptoms persist.

## 2012-10-11 NOTE — Patient Instructions (Signed)

## 2012-10-20 ENCOUNTER — Encounter: Payer: Self-pay | Admitting: Internal Medicine

## 2012-10-20 ENCOUNTER — Ambulatory Visit (AMBULATORY_SURGERY_CENTER): Payer: Medicare Other | Admitting: *Deleted

## 2012-10-20 VITALS — Ht 61.0 in | Wt 171.4 lb

## 2012-10-20 DIAGNOSIS — Z1211 Encounter for screening for malignant neoplasm of colon: Secondary | ICD-10-CM

## 2012-10-20 MED ORDER — MOVIPREP 100 G PO SOLR: ORAL | Status: DC

## 2012-10-20 NOTE — Progress Notes (Signed)
No allergies to eggs or soy. No problems with anesthesia.  

## 2012-11-04 ENCOUNTER — Encounter: Payer: Self-pay | Admitting: Internal Medicine

## 2012-11-04 ENCOUNTER — Ambulatory Visit (AMBULATORY_SURGERY_CENTER): Payer: Medicare Other | Admitting: Internal Medicine

## 2012-11-04 VITALS — BP 156/77 | HR 59 | Temp 96.2°F | Resp 17 | Ht 61.0 in | Wt 171.0 lb

## 2012-11-04 DIAGNOSIS — Z1211 Encounter for screening for malignant neoplasm of colon: Secondary | ICD-10-CM

## 2012-11-04 MED ORDER — SODIUM CHLORIDE 0.9 % IV SOLN: 500.0000 mL | INTRAVENOUS | Status: DC

## 2012-11-04 NOTE — Progress Notes (Signed)
Patient did not experience any of the following events: a burn prior to discharge; a fall within the facility; wrong site/side/patient/procedure/implant event; or a hospital transfer or hospital admission upon discharge from the facility. (G8907) Patient did not have preoperative order for IV antibiotic SSI prophylaxis. (G8918)  

## 2012-11-04 NOTE — Op Note (Signed)
 [facility] [address] [place], [address]   COLONOSCOPY PROCEDURE REPORT  PATIENT: Deborah Schultz, Deborah Schultz.  MR#: [id number] BIRTHDATE: 1945-07-11 , 67  yrs. old GENDER: Female ENDOSCOPIST: Hart Carwin, MD REFERRED BY:  Oliver Barre, M.D. PROCEDURE DATE:  11/04/2012 PROCEDURE:   Colonoscopy, screening ASA CLASS:   Class II INDICATIONS:Average risk patient for colon cancer and normal colonoscopy 2004. MEDICATIONS: propofol (Diprivan) 200mg  IV  DESCRIPTION OF PROCEDURE:   After the risks and benefits and of the procedure were explained, informed consent was obtained.  A digital rectal exam revealed no abnormalities of the rectum.    The LB [id number]  endoscope was introduced through the anus and advanced to the cecum, which was identified by both the appendix and ileocecal valve .  The quality of the prep was excellent, using MoviPrep .  The instrument was then slowly withdrawn as the colon was fully examined.     COLON FINDINGS: Moderate diverticulosis was noted throughout the entire examined colon.     Retroflexed views revealed no abnormalities.     The scope was then withdrawn from the patient and the procedure completed.  COMPLICATIONS: There were no complications. ENDOSCOPIC IMPRESSION: Moderate diverticulosis was noted throughout the entire examined colon  RECOMMENDATIONS: High fiber diet Metamucil 1 tsp daily   REPEAT EXAM: In 10 year(s)  for Colonoscopy.  cc:  _______________________________ eSignedHart Carwin, MD 11/04/2012 11:36 AM

## 2012-11-04 NOTE — Patient Instructions (Addendum)
Discharge instructions given with verbal understanding. Handouts on diverticulosis and a high fiber diet given. Resume previous medications.YOU HAD AN ENDOSCOPIC PROCEDURE TODAY AT THE [facility]: Refer to the procedure report that was given to you for any specific questions about what was found during the examination.  If the procedure report does not answer your questions, please call your gastroenterologist to clarify.  If you requested that your care partner not be given the details of your procedure findings, then the procedure report has been included in a sealed envelope for you to review at your convenience later.  YOU SHOULD EXPECT: Some feelings of bloating in the abdomen. Passage of more gas than usual.  Walking can help get rid of the air that was put into your GI tract during the procedure and reduce the bloating. If you had a lower endoscopy (such as a colonoscopy or flexible sigmoidoscopy) you may notice spotting of blood in your stool or on the toilet paper. If you underwent a bowel prep for your procedure, then you may not have a normal bowel movement for a few days.  DIET: Your first meal following the procedure should be a light meal and then it is ok to progress to your normal diet.  A half-sandwich or bowl of soup is an example of a good first meal.  Heavy or fried foods are harder to digest and may make you feel nauseous or bloated.  Likewise meals heavy in dairy and vegetables can cause extra gas to form and this can also increase the bloating.  Drink plenty of fluids but you should avoid alcoholic beverages for 24 hours.  ACTIVITY: Your care partner should take you home directly after the procedure.  You should plan to take it easy, moving slowly for the rest of the day.  You can resume normal activity the day after the procedure however you should NOT DRIVE or use heavy machinery for 24 hours (because of the sedation medicines used during the test).    SYMPTOMS TO  REPORT IMMEDIATELY: A gastroenterologist can be reached at any hour.  During normal business hours, 8:30 AM to 5:00 PM Monday through Friday, call [phone].  After hours and on weekends, please call the GI answering service at [phone] who will take a message and have the physician on call contact you.   Following lower endoscopy (colonoscopy or flexible sigmoidoscopy):  Excessive amounts of blood in the stool  Significant tenderness or worsening of abdominal pains  Swelling of the abdomen that is new, acute  Fever of 100F or higher  FOLLOW UP: If any biopsies were taken you will be contacted by phone or by letter within the next 1-3 weeks.  Call your gastroenterologist if you have not heard about the biopsies in 3 weeks.  Our staff will call the home number listed on your records the next business day following your procedure to check on you and address any questions or concerns that you may have at that time regarding the information given to you following your procedure. This is a courtesy call and so if there is no answer at the home number and we have not heard from you through the emergency physician on call, we will assume that you have returned to your regular daily activities without incident.  SIGNATURES/CONFIDENTIALITY: You and/or your care partner have signed paperwork which will be entered into your electronic medical record.  These signatures attest to the fact that that the information above on your   After Visit Summary has been reviewed and is understood.  Full responsibility of the confidentiality of this discharge information lies with you and/or your care-partner.   

## 2012-11-07 ENCOUNTER — Telehealth: Payer: Self-pay

## 2012-11-07 NOTE — Telephone Encounter (Signed)
Left message on answering machine. 

## 2013-02-27 ENCOUNTER — Other Ambulatory Visit: Payer: Self-pay | Admitting: Internal Medicine

## 2013-03-02 ENCOUNTER — Telehealth: Payer: Self-pay | Admitting: *Deleted

## 2013-03-02 MED ORDER — MECLIZINE HCL 12.5 MG PO TABS: 12.5000 mg | ORAL_TABLET | Freq: Three times a day (TID) | ORAL | Status: DC | PRN

## 2013-03-02 NOTE — Telephone Encounter (Signed)
Pt called states she has had several episodes of dizziness since 11.9.2014 lasting less than 30 seconds.  Please advise

## 2013-03-02 NOTE — Telephone Encounter (Signed)
Patient informed. 

## 2013-03-02 NOTE — Telephone Encounter (Signed)
Ok for trial meclizine - done erx  For OV if persists or gets worse

## 2013-04-26 ENCOUNTER — Encounter: Payer: Self-pay | Admitting: Internal Medicine

## 2013-04-26 ENCOUNTER — Ambulatory Visit (INDEPENDENT_AMBULATORY_CARE_PROVIDER_SITE_OTHER): Payer: Managed Care, Other (non HMO) | Admitting: Internal Medicine

## 2013-04-26 VITALS — BP 132/80 | HR 73 | Temp 97.8°F | Resp 12 | Wt 178.8 lb

## 2013-04-26 DIAGNOSIS — L304 Erythema intertrigo: Secondary | ICD-10-CM | POA: Insufficient documentation

## 2013-04-26 DIAGNOSIS — H6990 Unspecified Eustachian tube disorder, unspecified ear: Secondary | ICD-10-CM | POA: Insufficient documentation

## 2013-04-26 DIAGNOSIS — H6980 Other specified disorders of Eustachian tube, unspecified ear: Secondary | ICD-10-CM | POA: Insufficient documentation

## 2013-04-26 DIAGNOSIS — L538 Other specified erythematous conditions: Secondary | ICD-10-CM

## 2013-04-26 DIAGNOSIS — L299 Pruritus, unspecified: Secondary | ICD-10-CM

## 2013-04-26 DIAGNOSIS — I10 Essential (primary) hypertension: Secondary | ICD-10-CM

## 2013-04-26 MED ORDER — HYDROXYZINE HCL 25 MG PO TABS: 25.0000 mg | ORAL_TABLET | Freq: Every evening | ORAL | Status: DC | PRN

## 2013-04-26 MED ORDER — NYSTATIN 100000 UNIT/GM EX POWD: CUTANEOUS | Status: DC

## 2013-04-26 NOTE — Assessment & Plan Note (Signed)
For otc mucinex prn

## 2013-04-26 NOTE — Assessment & Plan Note (Signed)
stable overall by history and exam, recent data reviewed with pt, and pt to continue medical treatment as before,  to f/u any worsening symptoms or concerns BP Readings from Last 3 Encounters:  04/26/13 132/80  11/04/12 156/77  10/11/12 148/92

## 2013-04-26 NOTE — Patient Instructions (Signed)
Please take all new medication as prescribed  - for the powder for the rash, and the hydroxyzine for the itching Please continue all other medications as before, and refills have been done if requested. You can also take Mucinex (or it's generic off brand) for congestion, and tylenol as needed for pain.  Please return for your physical as you have planned

## 2013-04-26 NOTE — Assessment & Plan Note (Signed)
Mild to mod, for antibx course,  to f/u any worsening symptoms or concerns - for nystatin

## 2013-04-26 NOTE — Assessment & Plan Note (Signed)
For atarax prn,  to f/u any worsening symptoms or concerns  

## 2013-04-26 NOTE — Progress Notes (Signed)
Subjective:    Patient ID: Deborah Schultz, female    DOB: 1945-05-26, 68 y.o.   MRN: [id number]  HPI  Here to f/u with red rash to right chest area, mentions she scratches freq since a child, thinks possible infection vs other.  No fever, but some discomfort. Not better with caladryl gel clear.  Rash actually ongoing for 1 wk.  Also with mild decresaed hearing - ? Wax - asked to be checked.   No overt URi or allergy symptoms. Pt denies chest pain, increased sob or doe, wheezing, orthopnea, PND, increased LE swelling, palpitations, dizziness or syncope. Past Medical History  Diagnosis Date  . Diverticulitis   . HLD (hyperlipidemia)   . HTN (hypertension)   . OAB (overactive bladder)   . Degenerative disc disease, cervical   . Right carpal tunnel syndrome   . Anemia     iron deficiency  . Chronic pruritus   . ANEMIA-IRON DEFICIENCY 06/06/2008  . BICEPS TENDINITIS, LEFT 05/06/2010  . BRONCHITIS, ACUTE 06/18/2008  . CALLUSES, FEET, BILATERAL 06/06/2008  . CONSTIPATION 06/06/2008  . ALOPECIA 06/06/2008  . Cough 06/18/2008  . DIVERTICULITIS, HX OF 06/06/2008  . ELBOW PAIN 05/26/2010  . FATIGUE 06/18/2008  . HYPERLIPIDEMIA 06/06/2008  . OVERACTIVE BLADDER 06/06/2008  . PERIPHERAL EDEMA 02/19/2009  . SINUSITIS- ACUTE-NOS 01/08/2009  . Peripheral edema 02/26/2011   Past Surgical History  Procedure Laterality Date  . Abdominal hysterectomy    . Oophorectomy    . Ectopic pregnancy surgery    . Appendectomy    . Breast biopsy    . Bunionectomy    . Right thumb surgery    . Cataract extraction      bilateral 11/2009-no longer wears glasses    reports that she has never smoked. She has never used smokeless tobacco. She reports that she drinks alcohol. She reports that she does not use illicit drugs. family history includes Cancer in her maternal aunt and maternal uncle; Diabetes in her brother; Hypertension in her mother; Ovarian cancer in her mother. There is no history of Colon cancer. Allergies    Allergen Reactions  . Esomeprazole Magnesium     REACTION: Headache  . Fexofenadine     REACTION: Hives  . Sulfonamide Derivatives     Per pt: unknown reaction   Current Outpatient Prescriptions on File Prior to Visit  Medication Sig Dispense Refill  . amLODipine (NORVASC) 5 MG tablet Take 1 tablet (5 mg total) by mouth daily.  90 tablet  3  . aspirin 81 MG tablet Take 81 mg by mouth daily.        Marland Kitchen. estradiol (ESTRACE) 1 MG tablet Take 1 tablet (1 mg total) by mouth daily.  90 tablet  3  . furosemide (LASIX) 40 MG tablet 1 tab  By mouth twice per day, with the second dose per day as needed only for persistent swelling  180 tablet  3  . irbesartan (AVAPRO) 300 MG tablet Take 1 tablet (300 mg total) by mouth at bedtime.  90 tablet  3  . Multiple Vitamin (MULTIVITAMIN) tablet Take 1 tablet by mouth daily.        Marland Kitchen. oxybutynin (DITROPAN) 5 MG tablet Take 1 tablet (5 mg total) by mouth 2 (two) times daily.  180 tablet  3  . pravastatin (PRAVACHOL) 40 MG tablet Take 1 tablet (40 mg total) by mouth daily.  90 tablet  3  . propranolol ER (INDERAL LA) 80 MG 24 hr capsule TAKE ONE  CAPSULE BY MOUTH EVERY DAY  90 capsule  0  . VITAMIN D, CHOLECALCIFEROL, PO Take by mouth daily.        . [DISCONTINUED] solifenacin (VESICARE) 10 MG tablet Take 1 tablet (10 mg total) by mouth daily.  90 tablet  3   No current facility-administered medications on file prior to visit.   Review of Systems  Constitutional: Negative for unexpected weight change, or unusual diaphoresis  HENT: Negative for tinnitus.   Eyes: Negative for photophobia and visual disturbance.  Respiratory: Negative for choking and stridor.   Gastrointestinal: Negative for vomiting and blood in stool.  Genitourinary: Negative for hematuria and decreased urine volume.  Musculoskeletal: Negative for acute joint swelling Skin: Negative for color change and wound.  Neurological: Negative for tremors and numbness other than noted   Psychiatric/Behavioral: Negative for decreased concentration or  hyperactivity.       Objective:   Physical Exam BP 132/80  Pulse 73  Temp(Src) 97.8 F (36.6 C) (Oral)  Resp 12  Wt 178 lb 12.8 oz (81.103 kg)  SpO2 98% VS noted,  Constitutional: Pt appears well-developed and well-nourished.  HENT: Head: NCAT.  Right Ear: External ear normal.  Left Ear: External ear normal.  Bilat tm's with mild erythema.  Max sinus areas non tender.  Pharynx with mild erythema, no exudate Eyes: Conjunctivae and EOM are normal. Pupils are equal, round, and reactive to light.  Neck: Normal range of motion. Neck supple.  Cardiovascular: Normal rate and regular rhythm.   Pulmonary/Chest: Effort normal and breath sounds normal.  Neurological: Pt is alert. Not confused  Skin: has intertrigo between breasts Psychiatric: Pt behavior is normal. Thought content normal.     Assessment & Plan:

## 2013-04-27 ENCOUNTER — Telehealth: Payer: Self-pay | Admitting: Internal Medicine

## 2013-04-27 NOTE — Telephone Encounter (Signed)
Relevant patient education assigned to patient using Emmi. ° °

## 2013-05-03 ENCOUNTER — Other Ambulatory Visit: Payer: Self-pay | Admitting: Internal Medicine

## 2013-05-03 DIAGNOSIS — Z0000 Encounter for general adult medical examination without abnormal findings: Secondary | ICD-10-CM

## 2013-05-12 ENCOUNTER — Telehealth: Payer: Self-pay | Admitting: Internal Medicine

## 2013-05-12 DIAGNOSIS — I10 Essential (primary) hypertension: Secondary | ICD-10-CM

## 2013-05-12 MED ORDER — PRAVASTATIN SODIUM 40 MG PO TABS: 40.0000 mg | ORAL_TABLET | Freq: Every day | ORAL | Status: DC

## 2013-05-12 NOTE — Telephone Encounter (Signed)
Patient called requesting a refill on pravastatin (PRAVACHOL) 40 MG tablet  Please advise

## 2013-05-12 NOTE — Telephone Encounter (Signed)
Done erx 

## 2013-05-25 ENCOUNTER — Other Ambulatory Visit: Payer: Self-pay | Admitting: Internal Medicine

## 2013-06-07 ENCOUNTER — Other Ambulatory Visit (INDEPENDENT_AMBULATORY_CARE_PROVIDER_SITE_OTHER): Payer: Managed Care, Other (non HMO)

## 2013-06-07 DIAGNOSIS — Z0000 Encounter for general adult medical examination without abnormal findings: Secondary | ICD-10-CM

## 2013-06-07 DIAGNOSIS — E785 Hyperlipidemia, unspecified: Secondary | ICD-10-CM

## 2013-06-07 DIAGNOSIS — I10 Essential (primary) hypertension: Secondary | ICD-10-CM

## 2013-06-07 LAB — LIPID PANEL
CHOLESTEROL: 187 mg/dL (ref 0–200)
HDL: 62.1 mg/dL (ref >39.00)
LDL CALC: 99 mg/dL (ref 0–99)
TRIGLYCERIDES: 131 mg/dL (ref 0.0–149.0)
Total CHOL/HDL Ratio: 3
VLDL: 26.2 mg/dL (ref 0.0–40.0)

## 2013-06-07 LAB — BASIC METABOLIC PANEL
BUN: 12 mg/dL (ref 6–23)
CO2: 26 mEq/L (ref 19–32)
CREATININE: 0.9 mg/dL (ref 0.4–1.2)
Calcium: 9.3 mg/dL (ref 8.4–10.5)
Chloride: 102 mEq/L (ref 96–112)
GFR: 84.9 mL/min (ref >60.00)
Glucose, Bld: 89 mg/dL (ref 70–99)
Potassium: 3.7 mEq/L (ref 3.5–5.1)
Sodium: 136 mEq/L (ref 135–145)

## 2013-06-07 LAB — URINALYSIS, ROUTINE W REFLEX MICROSCOPIC
Bilirubin Urine: NEGATIVE
Hgb urine dipstick: NEGATIVE
Ketones, ur: NEGATIVE
Leukocytes, UA: NEGATIVE
NITRITE: NEGATIVE
PH: 6.5 (ref 5.0–8.0)
RBC / HPF: NONE SEEN (ref 0–?)
SPECIFIC GRAVITY, URINE: 1.015 (ref 1.000–1.030)
TOTAL PROTEIN, URINE-UPE24: NEGATIVE
Urine Glucose: NEGATIVE
Urobilinogen, UA: 0.2 (ref 0.0–1.0)

## 2013-06-07 LAB — CBC WITH DIFFERENTIAL/PLATELET
Basophils Absolute: 0 10*3/uL (ref 0.0–0.1)
Basophils Relative: 0.4 % (ref 0.0–3.0)
EOS PCT: 1.9 % (ref 0.0–5.0)
Eosinophils Absolute: 0.1 10*3/uL (ref 0.0–0.7)
HCT: 38.1 % (ref 36.0–46.0)
Hemoglobin: 12.6 g/dL (ref 12.0–15.0)
LYMPHS PCT: 35.3 % (ref 12.0–46.0)
Lymphs Abs: 2.3 10*3/uL (ref 0.7–4.0)
MCHC: 33 g/dL (ref 30.0–36.0)
MCV: 92.4 fl (ref 78.0–100.0)
MONOS PCT: 8.5 % (ref 3.0–12.0)
Monocytes Absolute: 0.6 10*3/uL (ref 0.1–1.0)
NEUTROS PCT: 53.9 % (ref 43.0–77.0)
Neutro Abs: 3.5 10*3/uL (ref 1.4–7.7)
Platelets: 287 10*3/uL (ref 150.0–400.0)
RBC: 4.12 Mil/uL (ref 3.87–5.11)
RDW: 13.5 % (ref 11.5–14.6)
WBC: 6.6 10*3/uL (ref 4.5–10.5)

## 2013-06-07 LAB — TSH: TSH: 2.71 u[IU]/mL (ref 0.35–5.50)

## 2013-06-07 LAB — HEPATIC FUNCTION PANEL
ALT: 19 U/L (ref 0–35)
AST: 22 U/L (ref 0–37)
Albumin: 3.8 g/dL (ref 3.5–5.2)
Alkaline Phosphatase: 56 U/L (ref 39–117)
Bilirubin, Direct: 0 mg/dL (ref 0.0–0.3)
Total Bilirubin: 0.5 mg/dL (ref 0.3–1.2)
Total Protein: 7.1 g/dL (ref 6.0–8.3)

## 2013-06-08 ENCOUNTER — Encounter: Payer: Medicare Other | Admitting: Internal Medicine

## 2013-06-08 ENCOUNTER — Ambulatory Visit (INDEPENDENT_AMBULATORY_CARE_PROVIDER_SITE_OTHER): Payer: Managed Care, Other (non HMO) | Admitting: Internal Medicine

## 2013-06-08 ENCOUNTER — Encounter: Payer: Self-pay | Admitting: Internal Medicine

## 2013-06-08 VITALS — BP 110/70 | HR 69 | Temp 98.4°F | Ht 61.0 in | Wt 178.1 lb

## 2013-06-08 DIAGNOSIS — Z0000 Encounter for general adult medical examination without abnormal findings: Secondary | ICD-10-CM

## 2013-06-08 DIAGNOSIS — Z136 Encounter for screening for cardiovascular disorders: Secondary | ICD-10-CM

## 2013-06-08 NOTE — Progress Notes (Signed)
Pre-visit discussion using our clinic review tool. No additional management support is needed unless otherwise documented below in the visit note.  

## 2013-06-08 NOTE — Assessment & Plan Note (Addendum)

## 2013-06-08 NOTE — Patient Instructions (Signed)
Please continue all other medications as before, and refills have been done if requested. Please have the pharmacy call with any other refills you may need. Please continue your efforts at being more active, low cholesterol diet, and weight control. You are otherwise up to date with prevention measures today. Please keep your appointments with your specialists as you may have planned  Please return for Nurse Visit for the Prevnar pneumonia shot if you change your mind  Please return in 1 year for your yearly visit, or sooner if needed, with Lab testing done 3-5 days before

## 2013-06-08 NOTE — Progress Notes (Signed)
Subjective:    Patient ID: Deborah Schultz, female    DOB: 11-16-1945, 68 y.o.   MRN: [id number]  HPI  Here for wellness and f/u;  Overall doing ok;  Pt denies CP, worsening SOB, DOE, wheezing, orthopnea, PND, worsening LE edema, palpitations, dizziness or syncope.  Pt denies neurological change such as new headache, facial or extremity weakness.  Pt denies polydipsia, polyuria, or low sugar symptoms. Pt states overall good compliance with treatment and medications, good tolerability, and has been trying to follow lower cholesterol diet.  Pt denies worsening depressive symptoms, suicidal ideation or panic. No fever, night sweats, wt loss, loss of appetite, or other constitutional symptoms.  Pt states good ability with ADL's, has low fall risk, home safety reviewed and adequate, no other significant changes in hearing or vision, and only occasionally active with exercise, plans to do more with better spring weather. Has walked up to 5 miles per day in [place] at the mall.  No other acute complaints Past Medical History  Diagnosis Date  . Diverticulitis   . HLD (hyperlipidemia)   . HTN (hypertension)   . OAB (overactive bladder)   . Degenerative disc disease, cervical   . Right carpal tunnel syndrome   . Anemia     iron deficiency  . Chronic pruritus   . ANEMIA-IRON DEFICIENCY 06/06/2008  . BICEPS TENDINITIS, LEFT 05/06/2010  . BRONCHITIS, ACUTE 06/18/2008  . CALLUSES, FEET, BILATERAL 06/06/2008  . CONSTIPATION 06/06/2008  . ALOPECIA 06/06/2008  . Cough 06/18/2008  . DIVERTICULITIS, HX OF 06/06/2008  . ELBOW PAIN 05/26/2010  . FATIGUE 06/18/2008  . HYPERLIPIDEMIA 06/06/2008  . OVERACTIVE BLADDER 06/06/2008  . PERIPHERAL EDEMA 02/19/2009  . SINUSITIS- ACUTE-NOS 01/08/2009  . Peripheral edema 02/26/2011   Past Surgical History  Procedure Laterality Date  . Abdominal hysterectomy    . Oophorectomy    . Ectopic pregnancy surgery    . Appendectomy    . Breast biopsy    . Bunionectomy    . Right thumb  surgery    . Cataract extraction      bilateral 11/2009-no longer wears glasses    reports that she has never smoked. She has never used smokeless tobacco. She reports that she drinks alcohol. She reports that she does not use illicit drugs. family history includes Cancer in her maternal aunt and maternal uncle; Diabetes in her brother; Hypertension in her mother; Ovarian cancer in her mother. There is no history of Colon cancer. Allergies  Allergen Reactions  . Esomeprazole Magnesium     REACTION: Headache  . Fexofenadine     REACTION: Hives  . Sulfonamide Derivatives     Per pt: unknown reaction   Current Outpatient Prescriptions on File Prior to Visit  Medication Sig Dispense Refill  . amLODipine (NORVASC) 5 MG tablet Take 1 tablet (5 mg total) by mouth daily.  90 tablet  3  . aspirin 81 MG tablet Take 81 mg by mouth daily.        . Biotin 1000 MCG tablet Take 1,000 mcg by mouth 3 (three) times daily.      Marland Kitchen estradiol (ESTRACE) 1 MG tablet Take 1 tablet (1 mg total) by mouth daily.  90 tablet  3  . furosemide (LASIX) 40 MG tablet 1 tab  By mouth twice per day, with the second dose per day as needed only for persistent swelling  180 tablet  3  . Glucosamine-Chondroitin (MOVE FREE PO) Take 1 capsule by mouth daily.      Marland Kitchen  hydrOXYzine (ATARAX/VISTARIL) 25 MG tablet Take 1 tablet (25 mg total) by mouth at bedtime as needed.  30 tablet  5  . irbesartan (AVAPRO) 300 MG tablet Take 1 tablet (300 mg total) by mouth at bedtime.  90 tablet  3  . Multiple Vitamin (MULTIVITAMIN) tablet Take 1 tablet by mouth daily.        Marland Kitchen. nystatin (MYCOSTATIN) powder Use as directed twice per day as needed for rash  30 g  0  . oxybutynin (DITROPAN) 5 MG tablet Take 1 tablet (5 mg total) by mouth 2 (two) times daily.  180 tablet  3  . pravastatin (PRAVACHOL) 40 MG tablet Take 1 tablet (40 mg total) by mouth daily.  90 tablet  3  . propranolol ER (INDERAL LA) 80 MG 24 hr capsule TAKE ONE  BY MOUTH ONCE DAILY  90  capsule  3  . VITAMIN D, CHOLECALCIFEROL, PO Take by mouth daily.        . [DISCONTINUED] solifenacin (VESICARE) 10 MG tablet Take 1 tablet (10 mg total) by mouth daily.  90 tablet  3   No current facility-administered medications on file prior to visit.   Review of Systems Constitutional: Negative for diaphoresis, activity change, appetite change or unexpected weight change.  HENT: Negative for hearing loss, ear pain, facial swelling, mouth sores and neck stiffness.   Eyes: Negative for pain, redness and visual disturbance.  Respiratory: Negative for shortness of breath and wheezing.   Cardiovascular: Negative for chest pain and palpitations.  Gastrointestinal: Negative for diarrhea, blood in stool, abdominal distention or other pain Genitourinary: Negative for hematuria, flank pain or change in urine volume.  Musculoskeletal: Negative for myalgias and joint swelling.  Skin: Negative for color change and wound.  Neurological: Negative for syncope and numbness. other than noted Hematological: Negative for adenopathy.  Psychiatric/Behavioral: Negative for hallucinations, self-injury, decreased concentration and agitation.      Objective:   Physical Exam BP 110/70  Pulse 69  Temp(Src) 98.4 F (36.9 C) (Oral)  Ht 5\' 1"  (1.549 m)  Wt 178 lb 2 oz (80.797 kg)  BMI 33.67 kg/m2  SpO2 97% VS noted,  Constitutional: Pt is oriented to person, place, and time. Appears well-developed and well-nourished.  Head: Normocephalic and atraumatic.  Right Ear: External ear normal.  Left Ear: External ear normal.  Nose: Nose normal.  Mouth/Throat: Oropharynx is clear and moist.  Eyes: Conjunctivae and EOM are normal. Pupils are equal, round, and reactive to light.  Neck: Normal range of motion. Neck supple. No JVD present. No tracheal deviation present.  Cardiovascular: Normal rate, regular rhythm, normal heart sounds and intact distal pulses.   Pulmonary/Chest: Effort normal and breath sounds  normal.  Abdominal: Soft. Bowel sounds are normal. There is no tenderness. No HSM  Musculoskeletal: Normal range of motion. Exhibits no edema.  Lymphadenopathy:  Has no cervical adenopathy.  Neurological: Pt is alert and oriented to person, place, and time. Pt has normal reflexes. No cranial nerve deficit.  Skin: Skin is warm and dry. No rash noted.  Psychiatric:  Has  normal mood and affect. Behavior is normal.     Assessment & Plan:

## 2013-08-22 ENCOUNTER — Other Ambulatory Visit: Payer: Self-pay | Admitting: Internal Medicine

## 2013-08-22 DIAGNOSIS — Z1231 Encounter for screening mammogram for malignant neoplasm of breast: Secondary | ICD-10-CM

## 2013-09-18 ENCOUNTER — Other Ambulatory Visit: Payer: Self-pay | Admitting: Internal Medicine

## 2013-09-20 ENCOUNTER — Ambulatory Visit (HOSPITAL_COMMUNITY)
Admission: RE | Admit: 2013-09-20 | Discharge: 2013-09-20 | Disposition: A | Discharge status: Home / Self Care | Payer: Medicare HMO | Source: Ambulatory Visit | Attending: Internal Medicine | Admitting: Internal Medicine

## 2013-09-20 DIAGNOSIS — Z1231 Encounter for screening mammogram for malignant neoplasm of breast: Secondary | ICD-10-CM | POA: Insufficient documentation

## 2013-09-21 ENCOUNTER — Other Ambulatory Visit: Payer: Self-pay | Admitting: Physician Assistant

## 2013-09-21 DIAGNOSIS — R928 Other abnormal and inconclusive findings on diagnostic imaging of breast: Secondary | ICD-10-CM

## 2013-09-26 ENCOUNTER — Other Ambulatory Visit: Payer: Self-pay | Admitting: Internal Medicine

## 2013-09-26 DIAGNOSIS — R928 Other abnormal and inconclusive findings on diagnostic imaging of breast: Secondary | ICD-10-CM

## 2013-10-02 ENCOUNTER — Ambulatory Visit
Admission: RE | Admit: 2013-10-02 | Discharge: 2013-10-02 | Disposition: A | Discharge status: Home / Self Care | Payer: Medicare HMO | Source: Ambulatory Visit | Attending: Physician Assistant | Admitting: Physician Assistant

## 2013-10-02 DIAGNOSIS — R928 Other abnormal and inconclusive findings on diagnostic imaging of breast: Secondary | ICD-10-CM

## 2013-10-02 LAB — HM MAMMOGRAPHY

## 2013-11-07 ENCOUNTER — Other Ambulatory Visit: Payer: Self-pay | Admitting: Internal Medicine

## 2013-11-25 ENCOUNTER — Encounter: Payer: Self-pay | Admitting: Family Medicine

## 2013-11-25 ENCOUNTER — Ambulatory Visit (INDEPENDENT_AMBULATORY_CARE_PROVIDER_SITE_OTHER): Payer: Medicare HMO | Admitting: Family Medicine

## 2013-11-25 VITALS — BP 140/71 | HR 77 | Temp 98.4°F | Wt 176.0 lb

## 2013-11-25 DIAGNOSIS — J0100 Acute maxillary sinusitis, unspecified: Secondary | ICD-10-CM

## 2013-11-25 MED ORDER — AZITHROMYCIN 250 MG PO TABS: ORAL_TABLET | ORAL | Status: DC

## 2013-11-25 NOTE — Patient Instructions (Signed)
Use 2 sprays of your nasonex once a day for the next 10d. Buy otc generic nasal saline spray and use 3 sprays each nostril 3 times per day to irrigate and moisturize your nasal passages. May continue mucinex and may take the one called MUCINEX DM.

## 2013-11-25 NOTE — Progress Notes (Signed)
OFFICE NOTE  11/25/2013  CC:  Chief Complaint  Patient presents with  . Sinusitis     HPI: Patient is a 68 y.o. African-American female who is here for 1 wk of gradually worsening nasal congestion/PND, slight cough that is nonproductive.  Initially some ST and subjective f/c but this is gone.  No wheezing or SOB.  She tried robitussin and mucinex plain a couple of different times.  No face pain or upper teeth pain, no HA.  Pertinent PMH:  Past medical, surgical hx reviewed.  MEDS:  Outpatient Prescriptions Prior to Visit  Medication Sig Dispense Refill  . amLODipine (NORVASC) 5 MG tablet TAKE ONE TABLET BY MOUTH ONCE DAILY  90 tablet  3  . aspirin 81 MG tablet Take 81 mg by mouth daily.        . Biotin 1000 MCG tablet Take 1,000 mcg by mouth 3 (three) times daily.      Marland Kitchen. estradiol (ESTRACE) 1 MG tablet Take 1 tablet (1 mg total) by mouth daily.  90 tablet  3  . furosemide (LASIX) 40 MG tablet TAKE ONE TABLET BY MOUTH TWICE DAILY .SECOND  DONE  ONLY  AS  NEEDED  FOR  PERSISTANT  SWELLING  180 tablet  0  . Glucosamine-Chondroitin (MOVE FREE PO) Take 1 capsule by mouth daily.      . hydrOXYzine (ATARAX/VISTARIL) 25 MG tablet Take 1 tablet (25 mg total) by mouth at bedtime as needed.  30 tablet  5  . irbesartan (AVAPRO) 300 MG tablet TAKE ONE TABLET BY MOUTH AT BEDTIME  90 tablet  3  . Multiple Vitamin (MULTIVITAMIN) tablet Take 1 tablet by mouth daily.        Marland Kitchen. nystatin (MYCOSTATIN) powder Use as directed twice per day as needed for rash  30 g  0  . oxybutynin (DITROPAN) 5 MG tablet TAKE ONE TABLET BY MOUTH TWICE DAILY  180 tablet  0  . pravastatin (PRAVACHOL) 40 MG tablet Take 1 tablet (40 mg total) by mouth daily.  90 tablet  3  . propranolol ER (INDERAL LA) 80 MG 24 hr capsule TAKE ONE  BY MOUTH ONCE DAILY  90 capsule  3  . VITAMIN D, CHOLECALCIFEROL, PO Take by mouth daily.         No facility-administered medications prior to visit.    PE: Blood pressure 140/71, pulse 77,  temperature 98.4 F (36.9 C), temperature source Oral, weight 176 lb (79.833 kg). VS: noted--normal. Gen: alert, NAD, NONTOXIC APPEARING. HEENT: eyes without injection, drainage, or swelling.  Ears: EACs clear, TMs with normal light reflex and landmarks.  Nose: Clear rhinorrhea, with some dried, crusty exudate adherent to mildly injected mucosa.  No purulent d/c.  No paranasal sinus TTP.  No facial swelling.  Throat and mouth without focal lesion.  No pharyngial swelling, erythema, or exudate.   Neck: supple, no LAD.   LUNGS: CTA bilat, nonlabored resps.   CV: RRR, no m/r/g. EXT: no c/c/e SKIN: no rash    IMPRESSION AND PLAN:  Prolonged URI, possibly bacterial. Continue mucinex DM, add nasonex that she already has at home, add saline nasal spray tid, and add Z-pack.  An After Visit Summary was printed and given to the patient.  FOLLOW UP: prn

## 2013-11-25 NOTE — Progress Notes (Signed)
Pre visit review using our clinic review tool, if applicable. No additional management support is needed unless otherwise documented below in the visit note. 

## 2014-02-02 ENCOUNTER — Other Ambulatory Visit: Payer: Self-pay

## 2014-03-12 ENCOUNTER — Other Ambulatory Visit: Payer: Self-pay | Admitting: Internal Medicine

## 2014-05-09 ENCOUNTER — Telehealth: Payer: Self-pay | Admitting: Internal Medicine

## 2014-05-09 DIAGNOSIS — I10 Essential (primary) hypertension: Secondary | ICD-10-CM

## 2014-05-09 MED ORDER — PRAVASTATIN SODIUM 40 MG PO TABS: 40.0000 mg | ORAL_TABLET | Freq: Every day | ORAL | Status: DC

## 2014-05-09 NOTE — Telephone Encounter (Signed)
Patient requesting refill of pravastatin (PRAVACHOL) 40 MG tablet [[id number]][[id number]] sent to [facility] on [address]

## 2014-05-10 ENCOUNTER — Telehealth: Payer: Self-pay | Admitting: Internal Medicine

## 2014-05-10 NOTE — Telephone Encounter (Signed)
pravastatin (PRAVACHOL) 40 MG tablet [[id number]][[id number]]  Patient calling to follow up on rx request. Advised that rx was called in about ten minutes ago

## 2014-05-28 ENCOUNTER — Other Ambulatory Visit: Payer: Self-pay | Admitting: Internal Medicine

## 2014-05-30 ENCOUNTER — Telehealth: Payer: Self-pay | Admitting: *Deleted

## 2014-05-30 MED ORDER — PROPRANOLOL HCL ER 80 MG PO CP24: 80.0000 mg | ORAL_CAPSULE | Freq: Every day | ORAL | Status: DC

## 2014-05-30 NOTE — Telephone Encounter (Signed)
Left msg on triage has schedule cpx for 08/02/14, but will be out of her propanolol requesting rx to be sent to [facility].../lmb  Notified pt refill has been sent...Raechel Chute/lmb

## 2014-07-25 ENCOUNTER — Telehealth: Payer: Self-pay | Admitting: Internal Medicine

## 2014-07-25 DIAGNOSIS — Z0000 Encounter for general adult medical examination without abnormal findings: Secondary | ICD-10-CM

## 2014-07-25 NOTE — Telephone Encounter (Signed)
Pt called in and would like her labs put in for cpe next week

## 2014-07-25 NOTE — Telephone Encounter (Signed)
cpx labs entered...Raechel Chute/lmb

## 2014-07-26 ENCOUNTER — Other Ambulatory Visit: Payer: Self-pay | Admitting: Internal Medicine

## 2014-07-26 DIAGNOSIS — Z0000 Encounter for general adult medical examination without abnormal findings: Secondary | ICD-10-CM

## 2014-07-31 ENCOUNTER — Other Ambulatory Visit (INDEPENDENT_AMBULATORY_CARE_PROVIDER_SITE_OTHER): Payer: PPO

## 2014-07-31 DIAGNOSIS — Z0000 Encounter for general adult medical examination without abnormal findings: Secondary | ICD-10-CM

## 2014-07-31 DIAGNOSIS — Z0189 Encounter for other specified special examinations: Secondary | ICD-10-CM | POA: Diagnosis not present

## 2014-07-31 LAB — CBC WITH DIFFERENTIAL/PLATELET
Basophils Absolute: 0 10*3/uL (ref 0.0–0.1)
Basophils Relative: 0.6 % (ref 0.0–3.0)
Eosinophils Absolute: 0.2 10*3/uL (ref 0.0–0.7)
Eosinophils Relative: 2.7 % (ref 0.0–5.0)
HCT: 39.8 % (ref 36.0–46.0)
Hemoglobin: 13.3 g/dL (ref 12.0–15.0)
Lymphocytes Relative: 37.3 % (ref 12.0–46.0)
Lymphs Abs: 2.4 10*3/uL (ref 0.7–4.0)
MCHC: 33.4 g/dL (ref 30.0–36.0)
MCV: 89.2 fl (ref 78.0–100.0)
Monocytes Absolute: 0.7 10*3/uL (ref 0.1–1.0)
Monocytes Relative: 10 % (ref 3.0–12.0)
Neutro Abs: 3.2 10*3/uL (ref 1.4–7.7)
Neutrophils Relative %: 49.4 % (ref 43.0–77.0)
Platelets: 270 10*3/uL (ref 150.0–400.0)
RBC: 4.46 Mil/uL (ref 3.87–5.11)
RDW: 13.8 % (ref 11.5–15.5)
WBC: 6.5 10*3/uL (ref 4.0–10.5)

## 2014-07-31 LAB — LIPID PANEL
CHOL/HDL RATIO: 3
CHOLESTEROL: 170 mg/dL (ref 0–200)
HDL: 60.1 mg/dL (ref >39.00)
LDL CALC: 94 mg/dL (ref 0–99)
NonHDL: 109.9
Triglycerides: 82 mg/dL (ref 0.0–149.0)
VLDL: 16.4 mg/dL (ref 0.0–40.0)

## 2014-07-31 LAB — HEPATIC FUNCTION PANEL
ALT: 18 U/L (ref 0–35)
AST: 18 U/L (ref 0–37)
Albumin: 4.1 g/dL (ref 3.5–5.2)
Alkaline Phosphatase: 70 U/L (ref 39–117)
Bilirubin, Direct: 0.1 mg/dL (ref 0.0–0.3)
Total Bilirubin: 0.5 mg/dL (ref 0.2–1.2)
Total Protein: 7.4 g/dL (ref 6.0–8.3)

## 2014-07-31 LAB — URINALYSIS, ROUTINE W REFLEX MICROSCOPIC
Bilirubin Urine: NEGATIVE
Hgb urine dipstick: NEGATIVE
Ketones, ur: NEGATIVE
Leukocytes, UA: NEGATIVE
Nitrite: NEGATIVE
RBC / HPF: NONE SEEN (ref 0–?)
Specific Gravity, Urine: 1.01 (ref 1.000–1.030)
Total Protein, Urine: NEGATIVE
Urine Glucose: NEGATIVE
Urobilinogen, UA: 0.2 (ref 0.0–1.0)
pH: 7 (ref 5.0–8.0)

## 2014-07-31 LAB — BASIC METABOLIC PANEL
BUN: 14 mg/dL (ref 6–23)
CO2: 27 meq/L (ref 19–32)
Calcium: 9.6 mg/dL (ref 8.4–10.5)
Chloride: 103 mEq/L (ref 96–112)
Creatinine, Ser: 0.88 mg/dL (ref 0.40–1.20)
GFR: 82.39 mL/min (ref >60.00)
Glucose, Bld: 85 mg/dL (ref 70–99)
POTASSIUM: 3.6 meq/L (ref 3.5–5.1)
SODIUM: 136 meq/L (ref 135–145)

## 2014-07-31 LAB — TSH: TSH: 2.59 u[IU]/mL (ref 0.35–4.50)

## 2014-08-02 ENCOUNTER — Ambulatory Visit (INDEPENDENT_AMBULATORY_CARE_PROVIDER_SITE_OTHER): Payer: PPO | Admitting: Internal Medicine

## 2014-08-02 ENCOUNTER — Encounter: Payer: Self-pay | Admitting: Internal Medicine

## 2014-08-02 VITALS — BP 132/82 | HR 55 | Temp 98.0°F | Resp 18 | Ht 61.0 in | Wt 177.1 lb

## 2014-08-02 DIAGNOSIS — Z0000 Encounter for general adult medical examination without abnormal findings: Secondary | ICD-10-CM

## 2014-08-02 NOTE — Progress Notes (Signed)
Pre visit review using our clinic review tool, if applicable. No additional management support is needed unless otherwise documented below in the visit note. 

## 2014-08-02 NOTE — Assessment & Plan Note (Signed)

## 2014-08-02 NOTE — Patient Instructions (Signed)
Please continue all other medications as before, and refills have been done if requested.  Please have the pharmacy call with any other refills you may need.  Please continue your efforts at being more active, low cholesterol diet, and weight control.  You are otherwise up to date with prevention measures today.  Please keep your appointments with your specialists as you may have planned  Please return in 1 year for your yearly visit, or sooner if needed, with Lab testing done 3-5 days before  

## 2014-08-02 NOTE — Progress Notes (Signed)
Subjective:    Patient ID: Deborah Schultz, female    DOB: 09/07/1945, 69 y.o.   MRN: [id number]  HPI  Here for wellness and f/u;  Overall doing ok;  Pt denies Chest pain, worsening SOB, DOE, wheezing, orthopnea, PND, worsening LE edema, palpitations, dizziness or syncope.  Pt denies neurological change such as new headache, facial or extremity weakness.  Pt denies polydipsia, polyuria, or low sugar symptoms. Pt states overall good compliance with treatment and medications, good tolerability, and has been trying to follow appropriate diet.  Pt denies worsening depressive symptoms, suicidal ideation or panic. No fever, night sweats, wt loss, loss of appetite, or other constitutional symptoms.  Pt states good ability with ADL's, has low fall risk, home safety reviewed and adequate, no other significant changes in hearing or vision, and active with exercise with walkng 5 days per wk, 30 min. Not losing the wt though Past Medical History  Diagnosis Date  . Diverticulitis   . HLD (hyperlipidemia)   . HTN (hypertension)   . OAB (overactive bladder)   . Degenerative disc disease, cervical   . Right carpal tunnel syndrome   . Anemia     iron deficiency  . Chronic pruritus   . ANEMIA-IRON DEFICIENCY 06/06/2008  . BICEPS TENDINITIS, LEFT 05/06/2010  . BRONCHITIS, ACUTE 06/18/2008  . CALLUSES, FEET, BILATERAL 06/06/2008  . CONSTIPATION 06/06/2008  . ALOPECIA 06/06/2008  . Cough 06/18/2008  . DIVERTICULITIS, HX OF 06/06/2008  . ELBOW PAIN 05/26/2010  . FATIGUE 06/18/2008  . HYPERLIPIDEMIA 06/06/2008  . OVERACTIVE BLADDER 06/06/2008  . PERIPHERAL EDEMA 02/19/2009  . SINUSITIS- ACUTE-NOS 01/08/2009  . Peripheral edema 02/26/2011   Past Surgical History  Procedure Laterality Date  . Abdominal hysterectomy    . Oophorectomy    . Ectopic pregnancy surgery    . Appendectomy    . Breast biopsy    . Bunionectomy    . Right thumb surgery    . Cataract extraction      bilateral 11/2009-no longer wears glasses     reports that she has never smoked. She has never used smokeless tobacco. She reports that she drinks alcohol. She reports that she does not use illicit drugs. family history includes Cancer in her maternal aunt and maternal uncle; Diabetes in her brother; Hypertension in her mother; Ovarian cancer in her mother. There is no history of Colon cancer. Allergies  Allergen Reactions  . Nystatin Rash  . Esomeprazole Magnesium     REACTION: Headache  . Fexofenadine     REACTION: Hives  . Sulfonamide Derivatives     Per pt: unknown reaction   Current Outpatient Prescriptions on File Prior to Visit  Medication Sig Dispense Refill  . amLODipine (NORVASC) 5 MG tablet TAKE ONE TABLET BY MOUTH ONCE DAILY 90 tablet 3  . aspirin 81 MG tablet Take 81 mg by mouth daily.      . Biotin 1000 MCG tablet Take 1,000 mcg by mouth 3 (three) times daily.    Marland Kitchen estradiol (ESTRACE) 1 MG tablet Take 1 tablet (1 mg total) by mouth daily. 90 tablet 3  . furosemide (LASIX) 40 MG tablet TAKE ONE TABLET BY MOUTH TWICE DAILY WITH  THE  SECOND  DOSE  TAKEN  ONLY  AS  NEEDED  FOR  PERSISTENT  SWELLING 180 tablet 0  . Glucosamine-Chondroitin (MOVE FREE PO) Take 1 capsule by mouth daily.    . hydrOXYzine (ATARAX/VISTARIL) 25 MG tablet Take 1 tablet (25 mg total) by mouth  at bedtime as needed. 30 tablet 5  . irbesartan (AVAPRO) 300 MG tablet TAKE ONE TABLET BY MOUTH AT BEDTIME 90 tablet 3  . Multiple Vitamin (MULTIVITAMIN) tablet Take 1 tablet by mouth daily.      Marland Kitchen. oxybutynin (DITROPAN) 5 MG tablet TAKE ONE TABLET BY MOUTH TWICE DAILY 180 tablet 0  . pravastatin (PRAVACHOL) 40 MG tablet Take 1 tablet (40 mg total) by mouth daily. 90 tablet 3  . propranolol ER (INDERAL LA) 80 MG 24 hr capsule Take 1 capsule (80 mg total) by mouth daily. 90 capsule 0  . VITAMIN D, CHOLECALCIFEROL, PO Take by mouth daily.       No current facility-administered medications on file prior to visit.    Review of Systems Constitutional:  Negative for increased diaphoresis, other activity, appetite or siginficant weight change other than noted HENT: Negative for worsening hearing loss, ear pain, facial swelling, mouth sores and neck stiffness.   Eyes: Negative for other worsening pain, redness or visual disturbance.  Respiratory: Negative for shortness of breath and wheezing  Cardiovascular: Negative for chest pain and palpitations.  Gastrointestinal: Negative for diarrhea, blood in stool, abdominal distention or other pain Genitourinary: Negative for hematuria, flank pain or change in urine volume.  Musculoskeletal: Negative for myalgias or other joint complaints.  Skin: Negative for color change and wound or drainage.  Neurological: Negative for syncope and numbness. other than noted Hematological: Negative for adenopathy. or other swelling Psychiatric/Behavioral: Negative for hallucinations, SI, self-injury, decreased concentration or other worsening agitation.      Objective:   Physical Exam BP 132/82 mmHg  Pulse 55  Temp(Src) 98 F (36.7 C) (Oral)  Resp 18  Ht 5\' 1"  (1.549 m)  Wt 177 lb 1.3 oz (80.323 kg)  BMI 33.48 kg/m2  SpO2 96% BP Readings from Last 3 Encounters:  08/02/14 132/82  11/25/13 140/71  06/08/13 110/70   Wt Readings from Last 3 Encounters:  08/02/14 177 lb 1.3 oz (80.323 kg)  11/25/13 176 lb (79.833 kg)  06/08/13 178 lb 2 oz (80.797 kg)  VS noted,  Constitutional: Pt is oriented to person, place, and time. Appears well-developed and well-nourished, in no significant distress Head: Normocephalic and atraumatic.  Right Ear: External ear normal.  Left Ear: External ear normal.  Nose: Nose normal.  Mouth/Throat: Oropharynx is clear and moist.  Eyes: Conjunctivae and EOM are normal. Pupils are equal, round, and reactive to light.  Neck: Normal range of motion. Neck supple. No JVD present. No tracheal deviation present or significant neck LA or mass Cardiovascular: Normal rate, regular rhythm,  normal heart sounds and intact distal pulses.   Pulmonary/Chest: Effort normal and breath sounds without rales or wheezing  Abdominal: Soft. Bowel sounds are normal. NT. No HSM  Musculoskeletal: Normal range of motion. Exhibits trace edema ankle bilat.  Lymphadenopathy:  Has no cervical adenopathy.  Neurological: Pt is alert and oriented to person, place, and time. Pt has normal reflexes. No cranial nerve deficit. Motor grossly intact Skin: Skin is warm and dry. No rash noted.  Psychiatric:  Has normal mood and affect. Behavior is normal.      Assessment & Plan:

## 2014-08-27 ENCOUNTER — Other Ambulatory Visit: Payer: Self-pay

## 2014-08-27 DIAGNOSIS — Z1231 Encounter for screening mammogram for malignant neoplasm of breast: Secondary | ICD-10-CM

## 2014-08-31 ENCOUNTER — Other Ambulatory Visit: Payer: Self-pay | Admitting: Internal Medicine

## 2014-09-05 ENCOUNTER — Other Ambulatory Visit: Payer: Self-pay | Admitting: Internal Medicine

## 2014-09-20 ENCOUNTER — Other Ambulatory Visit: Payer: Self-pay | Admitting: Internal Medicine

## 2014-09-24 ENCOUNTER — Ambulatory Visit
Admission: RE | Admit: 2014-09-24 | Discharge: 2014-09-24 | Disposition: A | Discharge status: Home / Self Care | Payer: PPO | Source: Ambulatory Visit

## 2014-09-24 DIAGNOSIS — Z1231 Encounter for screening mammogram for malignant neoplasm of breast: Secondary | ICD-10-CM

## 2014-09-25 ENCOUNTER — Other Ambulatory Visit: Payer: Self-pay | Admitting: Internal Medicine

## 2014-09-25 DIAGNOSIS — R928 Other abnormal and inconclusive findings on diagnostic imaging of breast: Secondary | ICD-10-CM

## 2014-09-27 ENCOUNTER — Ambulatory Visit
Admission: RE | Admit: 2014-09-27 | Discharge: 2014-09-27 | Disposition: A | Discharge status: Home / Self Care | Payer: PPO | Source: Ambulatory Visit | Attending: Internal Medicine | Admitting: Internal Medicine

## 2014-09-27 DIAGNOSIS — R928 Other abnormal and inconclusive findings on diagnostic imaging of breast: Secondary | ICD-10-CM

## 2014-10-15 ENCOUNTER — Other Ambulatory Visit: Payer: Self-pay

## 2014-11-05 ENCOUNTER — Telehealth: Payer: Self-pay | Admitting: Internal Medicine

## 2014-11-06 ENCOUNTER — Encounter: Payer: Self-pay | Admitting: Internal Medicine

## 2014-11-06 ENCOUNTER — Ambulatory Visit (INDEPENDENT_AMBULATORY_CARE_PROVIDER_SITE_OTHER): Payer: PPO | Admitting: Internal Medicine

## 2014-11-06 VITALS — BP 136/84 | HR 62 | Temp 97.9°F | Ht 61.0 in | Wt 177.0 lb

## 2014-11-06 DIAGNOSIS — M545 Low back pain, unspecified: Secondary | ICD-10-CM

## 2014-11-06 DIAGNOSIS — E785 Hyperlipidemia, unspecified: Secondary | ICD-10-CM

## 2014-11-06 DIAGNOSIS — I10 Essential (primary) hypertension: Secondary | ICD-10-CM | POA: Diagnosis not present

## 2014-11-06 NOTE — Progress Notes (Signed)
Pre visit review using our clinic review tool, if applicable. No additional management support is needed unless otherwise documented below in the visit note. 

## 2014-11-06 NOTE — Progress Notes (Signed)
Subjective:    Patient ID: Deborah Schultz, female    DOB: 1945-05-04, 69 y.o.   MRN: [id number]  HPI  Here to f/u; overall doing ok,  Pt denies chest pain, increasing sob or doe, wheezing, orthopnea, PND, increased LE swelling, palpitations, dizziness or syncope.  Pt denies new neurological symptoms such as new headache, or facial or extremity weakness or numbness.  Pt denies polydipsia, polyuria, or low sugar episode.   Pt denies new neurological symptoms such as new headache, or facial or extremity weakness or numbness.   Pt states overall good compliance with meds, mostly trying to follow appropriate diet, with wt overall stable, with little exercise lately due to worsening bilat prox LE achy discomfort worse to standing up from sitting.  Pt not sure if related to recurring LBP but no bowel or bladder change, fever, wt loss,  worsening LE numbness/weakness, gait change or falls. Past Medical History  Diagnosis Date  . Diverticulitis   . HLD (hyperlipidemia)   . HTN (hypertension)   . OAB (overactive bladder)   . Degenerative disc disease, cervical   . Right carpal tunnel syndrome   . Anemia     iron deficiency  . Chronic pruritus   . ANEMIA-IRON DEFICIENCY 06/06/2008  . BICEPS TENDINITIS, LEFT 05/06/2010  . BRONCHITIS, ACUTE 06/18/2008  . CALLUSES, FEET, BILATERAL 06/06/2008  . CONSTIPATION 06/06/2008  . ALOPECIA 06/06/2008  . Cough 06/18/2008  . DIVERTICULITIS, HX OF 06/06/2008  . ELBOW PAIN 05/26/2010  . FATIGUE 06/18/2008  . HYPERLIPIDEMIA 06/06/2008  . OVERACTIVE BLADDER 06/06/2008  . PERIPHERAL EDEMA 02/19/2009  . SINUSITIS- ACUTE-NOS 01/08/2009  . Peripheral edema 02/26/2011   Past Surgical History  Procedure Laterality Date  . Abdominal hysterectomy    . Oophorectomy    . Ectopic pregnancy surgery    . Appendectomy    . Breast biopsy    . Bunionectomy    . Right thumb surgery    . Cataract extraction      bilateral 11/2009-no longer wears glasses    reports that she has never  smoked. She has never used smokeless tobacco. She reports that she drinks alcohol. She reports that she does not use illicit drugs. family history includes Cancer in her maternal aunt and maternal uncle; Diabetes in her brother; Hypertension in her mother; Ovarian cancer in her mother. There is no history of Colon cancer. Allergies  Allergen Reactions  . Nystatin Rash  . Esomeprazole Magnesium     REACTION: Headache  . Fexofenadine     REACTION: Hives  . Sulfonamide Derivatives     Per pt: unknown reaction   Current Outpatient Prescriptions on File Prior to Visit  Medication Sig Dispense Refill  . amLODipine (NORVASC) 5 MG tablet TAKE ONE TABLET BY MOUTH ONCE DAILY 90 tablet 3  . aspirin 81 MG tablet Take 81 mg by mouth daily.      Marland Kitchen. estradiol (ESTRACE) 1 MG tablet Take 1 tablet (1 mg total) by mouth daily. 90 tablet 3  . furosemide (LASIX) 40 MG tablet TAKE ONE TABLET BY MOUTH TWICE DAILY WITH THE SECOND DOSE ONLY TAKE AS NEEDED FOR PERSISTANT SWELLING 180 tablet 3  . Glucosamine-Chondroitin (MOVE FREE PO) Take 1 capsule by mouth daily.    . hydrOXYzine (ATARAX/VISTARIL) 25 MG tablet Take 1 tablet (25 mg total) by mouth at bedtime as needed. 30 tablet 5  . irbesartan (AVAPRO) 300 MG tablet TAKE ONE TABLET BY MOUTH AT BEDTIME 90 tablet 3  . Multiple  Vitamin (MULTIVITAMIN) tablet Take 1 tablet by mouth daily.      Marland Kitchen oxybutynin (DITROPAN) 5 MG tablet TAKE ONE TABLET BY MOUTH TWICE DAILY 180 tablet 3  . pravastatin (PRAVACHOL) 40 MG tablet Take 1 tablet (40 mg total) by mouth daily. 90 tablet 3  . propranolol ER (INDERAL LA) 80 MG 24 hr capsule Take 1 capsule (80 mg total) by mouth daily. 90 capsule 0  . propranolol ER (INDERAL LA) 80 MG 24 hr capsule TAKE ONE CAPSULE BY MOUTH ONCE DAILY 90 capsule 3  . VITAMIN D, CHOLECALCIFEROL, PO Take by mouth daily.      . Biotin 1000 MCG tablet Take 1,000 mcg by mouth 3 (three) times daily.     No current facility-administered medications on file  prior to visit.   Review of Systems  Constitutional: Negative for unusual diaphoresis or night sweats HENT: Negative for ringing in ear or discharge Eyes: Negative for double vision or worsening visual disturbance.  Respiratory: Negative for choking and stridor.   Gastrointestinal: Negative for vomiting or other signifcant bowel change Genitourinary: Negative for hematuria or change in urine volume.  Musculoskeletal: Negative for other MSK pain or swelling Skin: Negative for color change and worsening wound.  Neurological: Negative for tremors and numbness other than noted  Psychiatric/Behavioral: Negative for decreased concentration or agitation other than above       Objective:   Physical Exam BP 136/84 mmHg  Pulse 62  Temp(Src) 97.9 F (36.6 C) (Oral)  Ht  (1.549 m)  Wt 177 lb (80.287 kg)  BMI 33.46 kg/m2  SpO2 99% VS noted,  Constitutional: Pt appears in no significant distress HENT: Head: NCAT.  Right Ear: External ear normal.  Left Ear: External ear normal.  Eyes: . Pupils are equal, round, and reactive to light. Conjunctivae and EOM are normal Neck: Normal range of motion. Neck supple.  Cardiovascular: Normal rate and regular rhythm.   Pulmonary/Chest: Effort normal and breath sounds without rales or wheezing.  Abd:  Soft, NT, ND, + BS Neurological: Pt is alert. Not confused , motor grossly intact Skin: Skin is warm. No rash, no LE edema Spine nontender Psychiatric: Pt behavior is normal. No agitation.     Assessment & Plan:

## 2014-11-06 NOTE — Patient Instructions (Addendum)
OK to stop the statin for now  Please call in 3 -4 wks if pain gone, to consider change to a different statin  Please return if pain persists or worsens, as you may need an MRI  Please continue all other medications as before, and refills have been done if requested.  Please have the pharmacy call with any other refills you may need.  Please continue your efforts at being more active, low cholesterol diet, and weight control.  Please keep your appointments with your specialists as you may have planned

## 2014-11-11 DIAGNOSIS — M545 Low back pain, unspecified: Secondary | ICD-10-CM | POA: Insufficient documentation

## 2014-11-11 NOTE — Assessment & Plan Note (Signed)
Etiology unclear, ? Spine vs msk, mild , tylenol prn, consier MRI if persists or worsens with further LE involvement

## 2014-11-11 NOTE — Assessment & Plan Note (Signed)
stable overall by history and exam, recent data reviewed with pt, and pt to continue medical treatment as before,  to f/u any worsening symptoms or concerns BP Readings from Last 3 Encounters:  11/06/14 136/84  08/02/14 132/82  11/25/13 140/71

## 2014-11-11 NOTE — Assessment & Plan Note (Signed)
With bilat prox LE myalgias, for trial off statin for 3-4 wks, consider re-start if not improved

## 2014-11-21 ENCOUNTER — Other Ambulatory Visit: Payer: Self-pay | Admitting: Internal Medicine

## 2014-12-03 ENCOUNTER — Other Ambulatory Visit: Payer: Self-pay | Admitting: Internal Medicine

## 2014-12-28 ENCOUNTER — Ambulatory Visit (INDEPENDENT_AMBULATORY_CARE_PROVIDER_SITE_OTHER): Payer: PPO | Admitting: Family

## 2014-12-28 ENCOUNTER — Encounter: Payer: Self-pay | Admitting: Family

## 2014-12-28 VITALS — BP 124/88 | HR 68 | Temp 98.0°F | Resp 18 | Ht 61.0 in | Wt 176.0 lb

## 2014-12-28 DIAGNOSIS — J069 Acute upper respiratory infection, unspecified: Secondary | ICD-10-CM | POA: Diagnosis not present

## 2014-12-28 MED ORDER — AZITHROMYCIN 250 MG PO TABS: ORAL_TABLET | ORAL | Status: DC

## 2014-12-28 NOTE — Progress Notes (Signed)
Subjective:    Patient ID: Deborah Schultz, female    DOB: 09-20-1945, 69 y.o.   MRN: [id number]  Chief Complaint  Patient presents with  . Cough    x3 days has had a cough, gets worse at night, drainage, describes as dry cough, having pain in ears,     HPI:  Deborah Schultz is a 69 y.o. female with a PMH of anemia, arthritis, hyperlipidemia, hypertension, diverticulitis, alopecia, and fatigue who presents today for an acute office visit.  This is a new problem. Associated symptoms of cough, drainage, and pain in her ears has been going on for approximately 4 days. Timing of the cough and drainage is worse at night and the severity wakes her up on occasion at night.. Modifying factors include robutussin-DM and Tylenol which has helped some but has not eliminated it. Denies any recent antibiotic use.   Allergies  Allergen Reactions  . Nystatin Rash  . Esomeprazole Magnesium     REACTION: Headache  . Fexofenadine     REACTION: Hives  . Sulfonamide Derivatives     Per pt: unknown reaction    Current Outpatient Prescriptions on File Prior to Visit  Medication Sig Dispense Refill  . amLODipine (NORVASC) 5 MG tablet TAKE ONE TABLET BY MOUTH ONCE DAILY 90 tablet 3  . aspirin 81 MG tablet Take 81 mg by mouth daily.      . Biotin 1000 MCG tablet Take 1,000 mcg by mouth 3 (three) times daily.    Marland Kitchen estradiol (ESTRACE) 1 MG tablet Take 1 tablet (1 mg total) by mouth daily. 90 tablet 3  . furosemide (LASIX) 40 MG tablet TAKE ONE TABLET BY MOUTH TWICE DAILY WITH THE SECOND DOSE ONLY TAKE AS NEEDED FOR PERSISTANT SWELLING 180 tablet 3  . Glucosamine-Chondroitin (MOVE FREE PO) Take 1 capsule by mouth daily.    . hydrOXYzine (ATARAX/VISTARIL) 25 MG tablet Take 1 tablet (25 mg total) by mouth at bedtime as needed. 30 tablet 5  . irbesartan (AVAPRO) 300 MG tablet Take 1 tablet (300 mg total) by mouth at bedtime. 90 tablet 1  . Multiple Vitamin (MULTIVITAMIN) tablet Take 1 tablet by mouth  daily.      Marland Kitchen oxybutynin (DITROPAN) 5 MG tablet TAKE ONE TABLET BY MOUTH TWICE DAILY 180 tablet 3  . propranolol ER (INDERAL LA) 80 MG 24 hr capsule Take 1 capsule (80 mg total) by mouth daily. 90 capsule 0  . propranolol ER (INDERAL LA) 80 MG 24 hr capsule TAKE ONE CAPSULE BY MOUTH ONCE DAILY 90 capsule 3  . VITAMIN D, CHOLECALCIFEROL, PO Take by mouth daily.       No current facility-administered medications on file prior to visit.     Review of Systems  Constitutional: Negative for fever and chills.  HENT: Positive for congestion, ear pain and sore throat. Negative for sinus pressure.   Respiratory: Positive for cough. Negative for chest tightness and shortness of breath.   Neurological: Negative for headaches.      Objective:    BP 124/88 mmHg  Pulse 68  Temp(Src) 98 F (36.7 C) (Oral)  Resp 18  Ht  (1.549 m)  Wt 176 lb (79.833 kg)  BMI 33.27 kg/m2  SpO2 94% Nursing note and vital signs reviewed.  Physical Exam  Constitutional: She is oriented to person, place, and time. She appears well-developed and well-nourished. No distress.  HENT:  Right Ear: Hearing, tympanic membrane, external ear and ear canal normal.  Left Ear:  Hearing, tympanic membrane, external ear and ear canal normal.  Nose: Right sinus exhibits no maxillary sinus tenderness and no frontal sinus tenderness. Left sinus exhibits no maxillary sinus tenderness and no frontal sinus tenderness.  Mouth/Throat: Uvula is midline, oropharynx is clear and moist and mucous membranes are normal.  Cardiovascular: Normal rate, regular rhythm, normal heart sounds and intact distal pulses.   Pulmonary/Chest: Effort normal and breath sounds normal.  Neurological: She is alert and oriented to person, place, and time.  Skin: Skin is warm and dry.  Psychiatric: She has a normal mood and affect. Her behavior is normal. Judgment and thought content normal.       Assessment & Plan:   Problem List Items Addressed This  Visit      Respiratory   Acute upper respiratory infection - Primary    Symptoms and exam consistent with acute upper respiratory infection most likely viral and she is seeing improvements in the last day. Written prescription for azithromycin provided with instructions to fill only if symptoms worsen in the next 1-2 days. Continue over-the-counter medications as needed for symptom relief and supportive care. Follow up if symptoms worsen or fail to improve.      Relevant Medications   azithromycin (ZITHROMAX) 250 MG tablet

## 2014-12-28 NOTE — Progress Notes (Signed)
Pre visit review using our clinic review tool, if applicable. No additional management support is needed unless otherwise documented below in the visit note. 

## 2014-12-28 NOTE — Assessment & Plan Note (Signed)
Symptoms and exam consistent with acute upper respiratory infection most likely viral and she is seeing improvements in the last day. Written prescription for azithromycin provided with instructions to fill only if symptoms worsen in the next 1-2 days. Continue over-the-counter medications as needed for symptom relief and supportive care. Follow up if symptoms worsen or fail to improve.

## 2014-12-28 NOTE — Patient Instructions (Signed)
Thank you for choosing [facility].  Summary/Instructions:  Your prescription(s) have been submitted to your pharmacy or been printed and provided for you. Please take as directed and contact our office if you believe you are having problem(s) with the medication(s) or have any questions.  If your symptoms worsen or fail to improve, please contact our office for further instruction, or in case of emergency go directly to the emergency room at the closest medical facility.    General Recommendations:    Please drink plenty of fluids.  Get plenty of rest   Sleep in humidified air  Use saline nasal sprays  Netti pot   OTC Medications:  Decongestants - helps relieve congestion   Flonase (generic fluticasone) or Nasacort (generic triamcinolone) - please make sure to use the "cross-over" technique at a 45 degree angle towards the opposite eye as opposed to straight up the nasal passageway.   If you have HIGH BLOOD PRESSURE - Coricidin HBP; AVOID any product that is -D as this contains pseudoephedrine which may increase your blood pressure.  Afrin (oxymetazoline) every 6-8 hours for up to 3 days.   Allergies - helps relieve runny nose, itchy eyes and sneezing   Claritin (generic loratidine), Allegra (fexofenidine), or Zyrtec (generic cyrterizine) for runny nose. These medications should not cause drowsiness.  Note - Benadryl (generic diphenhydramine) may be used however may cause drowsiness  Cough -   Delsym or Robitussin (generic dextromethorphan)  Expectorants - helps loosen mucus to ease removal   Mucinex (generic guaifenesin) as directed on the package.  Headaches / General Aches   Tylenol (generic acetaminophen) - DO NOT EXCEED 3 grams (3,000 mg) in a 24 hour time period  Advil/Motrin (generic ibuprofen)   Sore Throat -   Salt water gargle   Chloraseptic (generic benzocaine) spray or lozenges / Sucrets (generic dyclonine)    Upper Respiratory  Infection, Adult An upper respiratory infection (URI) is also sometimes known as the common cold. The upper respiratory tract includes the nose, sinuses, throat, trachea, and bronchi. Bronchi are the airways leading to the lungs. Most people improve within 1 week, but symptoms can last up to 2 weeks. A residual cough may last even longer.  CAUSES Many different viruses can infect the tissues lining the upper respiratory tract. The tissues become irritated and inflamed and often become very moist. Mucus production is also common. A cold is contagious. You can easily spread the virus to others by oral contact. This includes kissing, sharing a glass, coughing, or sneezing. Touching your mouth or nose and then touching a surface, which is then touched by another person, can also spread the virus. SYMPTOMS  Symptoms typically develop 1 to 3 days after you come in contact with a cold virus. Symptoms vary from person to person. They may include:  Runny nose.  Sneezing.  Nasal congestion.  Sinus irritation.  Sore throat.  Loss of voice (laryngitis).  Cough.  Fatigue.  Muscle aches.  Loss of appetite.  Headache.  Low-grade fever. DIAGNOSIS  You might diagnose your own cold based on familiar symptoms, since most people get a cold 2 to 3 times a year. Your caregiver can confirm this based on your exam. Most importantly, your caregiver can check that your symptoms are not due to another disease such as strep throat, sinusitis, pneumonia, asthma, or epiglottitis. Blood tests, throat tests, and X-rays are not necessary to diagnose a common cold, but they may sometimes be helpful in excluding other more serious diseases.  Your caregiver will decide if any further tests are required. RISKS AND COMPLICATIONS  You may be at risk for a more severe case of the common cold if you smoke cigarettes, have chronic heart disease (such as heart failure) or lung disease (such as asthma), or if you have a  weakened immune system. The very young and very old are also at risk for more serious infections. Bacterial sinusitis, middle ear infections, and bacterial pneumonia can complicate the common cold. The common cold can worsen asthma and chronic obstructive pulmonary disease (COPD). Sometimes, these complications can require emergency medical care and may be life-threatening. PREVENTION  The best way to protect against getting a cold is to practice good hygiene. Avoid oral or hand contact with people with cold symptoms. Wash your hands often if contact occurs. There is no clear evidence that vitamin C, vitamin E, echinacea, or exercise reduces the chance of developing a cold. However, it is always recommended to get plenty of rest and practice good nutrition. TREATMENT  Treatment is directed at relieving symptoms. There is no cure. Antibiotics are not effective, because the infection is caused by a virus, not by bacteria. Treatment may include:  Increased fluid intake. Sports drinks offer valuable electrolytes, sugars, and fluids.  Breathing heated mist or steam (vaporizer or shower).  Eating chicken soup or other clear broths, and maintaining good nutrition.  Getting plenty of rest.  Using gargles or lozenges for comfort.  Controlling fevers with ibuprofen or acetaminophen as directed by your caregiver.  Increasing usage of your inhaler if you have asthma. Zinc gel and zinc lozenges, taken in the first 24 hours of the common cold, can shorten the duration and lessen the severity of symptoms. Pain medicines may help with fever, muscle aches, and throat pain. A variety of non-prescription medicines are available to treat congestion and runny nose. Your caregiver can make recommendations and may suggest nasal or lung inhalers for other symptoms.  HOME CARE INSTRUCTIONS   Only take over-the-counter or prescription medicines for pain, discomfort, or fever as directed by your caregiver.  Use a warm  mist humidifier or inhale steam from a shower to increase air moisture. This may keep secretions moist and make it easier to breathe.  Drink enough water and fluids to keep your urine clear or pale yellow.  Rest as needed.  Return to work when your temperature has returned to normal or as your caregiver advises. You may need to stay home longer to avoid infecting others. You can also use a face mask and careful hand washing to prevent spread of the virus. SEEK MEDICAL CARE IF:   After the first few days, you feel you are getting worse rather than better.  You need your caregiver's advice about medicines to control symptoms.  You develop chills, worsening shortness of breath, or brown or red sputum. These may be signs of pneumonia.  You develop yellow or brown nasal discharge or pain in the face, especially when you bend forward. These may be signs of sinusitis.  You develop a fever, swollen neck glands, pain with swallowing, or white areas in the back of your throat. These may be signs of strep throat. SEEK IMMEDIATE MEDICAL CARE IF:   You have a fever.  You develop severe or persistent headache, ear pain, sinus pain, or chest pain.  You develop wheezing, a prolonged cough, cough up blood, or have a change in your usual mucus (if you have chronic lung disease).  You develop sore  muscles or a stiff neck. Document Released: 09/30/2000 Document Revised: 06/29/2011 Document Reviewed: 07/12/2013 [facility] Patient Information 2015 [place], [place]. This information is not intended to replace advice given to you by your health care provider. Make sure you discuss any questions you have with your health care provider.

## 2015-02-22 ENCOUNTER — Other Ambulatory Visit: Payer: Self-pay

## 2015-02-22 ENCOUNTER — Other Ambulatory Visit: Payer: Self-pay | Admitting: Internal Medicine

## 2015-02-22 DIAGNOSIS — N6002 Solitary cyst of left breast: Secondary | ICD-10-CM

## 2015-02-22 DIAGNOSIS — R921 Mammographic calcification found on diagnostic imaging of breast: Secondary | ICD-10-CM

## 2015-03-28 ENCOUNTER — Other Ambulatory Visit: Payer: PPO

## 2015-03-29 ENCOUNTER — Ambulatory Visit
Admission: RE | Admit: 2015-03-29 | Discharge: 2015-03-29 | Disposition: A | Discharge status: Home / Self Care | Payer: PPO | Source: Ambulatory Visit | Attending: Internal Medicine | Admitting: Internal Medicine

## 2015-03-29 DIAGNOSIS — N6002 Solitary cyst of left breast: Secondary | ICD-10-CM

## 2015-03-29 DIAGNOSIS — R921 Mammographic calcification found on diagnostic imaging of breast: Secondary | ICD-10-CM

## 2015-05-31 ENCOUNTER — Other Ambulatory Visit: Payer: Self-pay | Admitting: Internal Medicine

## 2015-07-15 ENCOUNTER — Telehealth: Payer: Self-pay

## 2015-07-15 NOTE — Telephone Encounter (Signed)
Call to see if Mrs Deborah Schultz can move her apt to 8am or 10am for AWV on the 31st

## 2015-07-16 ENCOUNTER — Encounter (INDEPENDENT_AMBULATORY_CARE_PROVIDER_SITE_OTHER): Payer: Self-pay

## 2015-07-17 ENCOUNTER — Telehealth: Payer: Self-pay

## 2015-07-17 NOTE — Telephone Encounter (Signed)
Outreach to Ms. Deborah Schultz to change apt from 9am on Friday to 10am for AWV. Agreed to change

## 2015-07-19 ENCOUNTER — Ambulatory Visit: Payer: PPO

## 2015-07-19 ENCOUNTER — Ambulatory Visit (INDEPENDENT_AMBULATORY_CARE_PROVIDER_SITE_OTHER): Payer: PPO

## 2015-07-19 VITALS — BP 120/70 | Ht 62.0 in | Wt 173.0 lb

## 2015-07-19 DIAGNOSIS — Z0000 Encounter for general adult medical examination without abnormal findings: Secondary | ICD-10-CM

## 2015-07-19 NOTE — Progress Notes (Addendum)
Subjective:   Deborah Schultz is a 70 y.o. female who presents for Medicare Annual (Subsequent) preventive examination.  Review of Systems:   HRA assessment completed during visit; Tomma LightningDonaldson, Tymeka  The Patient was informed that this wellness visit is to identify risk and educate on how to reduce risk for increase disease through lifestyle changes.   ROS deferred to CPE exam with physician Apt with [name] next April   Medical and family hx Mother had HTN; Ovarian cancer/ she did die of ovarian cancer;  Brother had DM Maternal aunt and uncle had cancer  ETOH yes/ rare  Tobacco; never smoked  Describes health good What keeps it from being great; States "I weight to much".  Medical issues  HTN / BP normal range today Lipids 07/2014 chol 170; Trig 82; HDL 60; LDL 94 (FBG 85) To be redrawn at CPE this year DDD/ no c/o today Hyperlipidemia Reviewed last year's labs   BMI: 31.6; gaining over the last 15 years;  Good weight back in her 20's low weight; started gaining past 70 years of age; has gained consistently since  Diet;  Breakfast; cheerio's w banana; 1% milk; cups of applesauce; fruit; frozen; toast; eggs on occasion;  Lunch; Whether she is going out or staying at home;  [employer] meat; sandwich with whole wheat bread; bag of chips; Cheetos Left over pizza; [employer]  Supper; meat; vegetables; more chicken; or place in frying pan but uses very little oil  On gravy; chicken and dumplings Tried nutri system; didn't like the food;   Exercise; housekeeping;  Likes walking; used to walk 5 days a week at the mall;  Has a treadmill; [place]'t [place]; but will not go alone;  May check with health team for Fitness benefit;   SAFETY; one level  Safety reviewed for the home; yes;  Removal of clutter clearing paths through the home,   Bathroom safety reviewed  Community safety; except for a few animals; stream running through back of house;  Smoke detectors  yes Firearms safety / to keep in a safe Driving accidents and seatbelt/no Sun protection/ moisturizer has sun screen  Stressors no  Medication review/no issues  Fall assessment no Gait assessment/ no issues  Mobilization and Functional losses in the last year./ no Sleep patterns/ sometimes; spouse snores / educated on OSA   Urinary or fecal incontinence reviewed/ no verbalized c/o  Counseling: Hep c to be drawn at next visit  Colonoscopy; 10/2012/ due 10/2022 EKG: 05/2013 Mammogram 03/2015 repeat in 6 months; calcifications/ the breast center;   Dexa 05/2013 / DUE/ GYN q year/ Dr. Jennette KettleNeal [place]/will see July or august;  Referred to the National osteoporosis foundation for more information on osteoporosis. 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   Tobacco History  Smoking status  . Never Smoker   Smokeless tobacco  . Never Used     Counseling given: Yes   Past Medical History  Diagnosis Date  . Diverticulitis   . HLD (hyperlipidemia)   . HTN (hypertension)   . OAB (overactive bladder)   . Degenerative disc disease, cervical   . Right carpal tunnel syndrome   . Anemia     iron deficiency  . Chronic pruritus   . ANEMIA-IRON DEFICIENCY 06/06/2008  . BICEPS TENDINITIS, LEFT 05/06/2010  . BRONCHITIS, ACUTE  06/18/2008  . CALLUSES, FEET, BILATERAL 06/06/2008  . CONSTIPATION 06/06/2008  . ALOPECIA 06/06/2008  . Cough 06/18/2008  . DIVERTICULITIS, HX OF 06/06/2008  . ELBOW PAIN 05/26/2010  . FATIGUE 06/18/2008  . HYPERLIPIDEMIA 06/06/2008  . OVERACTIVE BLADDER 06/06/2008  . PERIPHERAL EDEMA 02/19/2009  . SINUSITIS- ACUTE-NOS 01/08/2009  . Peripheral edema 02/26/2011   Past Surgical History  Procedure Laterality Date  . Abdominal hysterectomy    . Oophorectomy    . Ectopic pregnancy surgery    . Appendectomy    . Breast biopsy    . Bunionectomy    . Right thumb surgery    . Cataract extraction      bilateral 11/2009-no longer wears glasses   Family History  Problem Relation Age of Onset  . Hypertension Mother   . Ovarian cancer Mother   . Diabetes Brother   . Cancer Maternal Aunt     unspecif if mat or pat  . Cancer Maternal Uncle     unspecif if mat or pat  . Colon cancer Neg Hx    History  Sexual Activity  . Sexual Activity: Not on file    Outpatient Encounter Prescriptions as of 07/19/2015  Medication Sig  . amLODipine (NORVASC) 5 MG tablet TAKE ONE TABLET BY MOUTH ONCE DAILY  . aspirin 81 MG tablet Take 81 mg by mouth daily.    Marland Kitchen azithromycin (ZITHROMAX) 250 MG tablet Take 2 tablets for 1 day and then 1 tablet daily for 4 days.  . Biotin 1000 MCG tablet Take 1,000 mcg by mouth 3 (three) times daily.  Marland Kitchen estradiol (ESTRACE) 1 MG tablet Take 1 tablet (1 mg total) by mouth daily.  . furosemide (LASIX) 40 MG tablet TAKE ONE TABLET BY MOUTH TWICE DAILY WITH THE SECOND DOSE ONLY TAKE AS NEEDED FOR PERSISTANT SWELLING  . Glucosamine-Chondroitin (MOVE FREE PO) Take 1 capsule by mouth daily.  . hydrOXYzine (ATARAX/VISTARIL) 25 MG tablet Take 1 tablet (25 mg total) by mouth at bedtime as needed.  . irbesartan (AVAPRO) 300 MG tablet TAKE ONE TABLET BY MOUTH AT BEDTIME  . Multiple Vitamin (MULTIVITAMIN) tablet Take 1 tablet by mouth daily.    Marland Kitchen oxybutynin (DITROPAN) 5 MG tablet TAKE ONE TABLET BY  MOUTH TWICE DAILY  . propranolol ER (INDERAL LA) 80 MG 24 hr capsule Take 1 capsule (80 mg total) by mouth daily.  . propranolol ER (INDERAL LA) 80 MG 24 hr capsule TAKE ONE CAPSULE BY MOUTH ONCE DAILY  . VITAMIN D, CHOLECALCIFEROL, PO Take by mouth daily.     No facility-administered encounter medications on file as of 07/19/2015.    Activities of Daily Living No flowsheet data found.  Patient Care Team: Corwin Levins, MD as PCP - General    Assessment:     Exercise Activities and Dietary recommendations    Goals    .  Weight < 150 lb (68.04 kg)     Has cut out sugar; /no sugary drinks;  Walking; on treadmill; has different programs;  Will document food intake x 3 days  You can use calorieking.com or LimitLaws.com.cy  Looking for overall calories per day; and the distribution of the calories as carbs; fat, sodium and protein      Fall Risk Fall Risk  07/19/2015 08/02/2014 04/26/2013 04/26/2013  Falls in the past year? No No Yes No   Depression Screen PHQ 2/9 Scores 07/19/2015 08/02/2014 04/26/2013 04/26/2013  PHQ - 2 Score 0 0 0 0     Cognitive Testing No flowsheet data found.  Immunization History  Administered Date(s) Administered  . Influenza Split 02/29/2012  . Influenza Whole 01/19/2008  . Td 06/06/2008  . Zoster 06/06/2008   Screening Tests Health Maintenance  Topic Date Due  . Hepatitis C Screening  02-Sep-1945  . DEXA SCAN  06/03/2012  . PNA vac Low Risk Adult (1 of 2 - PCV13) 06/03/2012  . INFLUENZA VACCINE  11/19/2014  . MAMMOGRAM  09/23/2016  . TETANUS/TDAP  06/06/2018  . COLONOSCOPY  11/05/2022  . ZOSTAVAX  Completed      Plan:   Darl Pikes will look for Dexa scan; (bone density)   Declines flu shot today   Will discuss dexa with Dr. Jonny Ruiz at visit Referred to the [facility] for Osteoporosis for more information  Will consider prevnar; (1st pneumonia vaccine) and you can read about in online at [email or web address]  Will review Advanced Directive and complete  or call for questions. [place] offers free advance directive forms, as well as assistance in completing the forms themselves.   During the course of the visit the patient was educated and counseled about the following appropriate screening and preventive services:   Vaccines to include Pneumoccal, Influenza, Hepatitis B, Td, Zostavax, HCV  Electrocardiogram - 05/2013  Cardiovascular Disease; States BP is low when at home; Good today; Will work on [employer];   Colorectal cancer screening/ Due 10/2022  Bone density screening/ to locate original exam; May consider at CPE this year  Diabetes screening/ neg  Glaucoma screening/ neg; eye exam annually   Mammography/PAP/ 12/23016 / due in 6 months   Nutrition counseling / spent 30 minutes discussing weight loss; as well as keeping a food journal to document carbs; sodium; fat and calories per day;  Then can decide which food items she may need to drop and also motivated to get on the treadmill x 30 minutes; 5 days a week   Patient Instructions (the written plan) was given to the patient.   Montine Circle, RN  07/19/2015   Medical screening examination/treatment/procedure(s) were performed by non-physician practitioner and as supervising physician I was immediately available for consultation/collaboration. I agree with above. Oliver Barre, MD

## 2015-07-19 NOTE — Patient Instructions (Addendum)
Deborah Schultz , Thank you for taking time to come for your Medicare Wellness Visit. I appreciate your ongoing commitment to your health goals. Please review the following plan we discussed and let me know if I can assist you in the future.   Deborah Schultz will look for Dexa scan; (bone density)   Declines flu shot today   Will discuss dexa with Dr. Jenny Schultz at visit Will consider prevnar; (1st pneumonia vaccine) and you can read about in online at [email or web address]   Will review Advanced Directive and complete or call for questions. Deborah Schultz offers free advance directive forms, as well as assistance in completing the forms themselves.  For assistance, contact the Spiritual Care Department at [phone], or the Clinical Social Work Department at [phone].    These are the goals we discussed: Goals    . Weight < 150 lb (68.04 kg)     Has cut out sugar; /no sugary drinks;  Walking; on treadmill; has different programs;  Will document food intake x 3 days  You can use calorieking.com or http://vang.com/  Looking for overall calories per day; and the distribution of the calories as carbs; fat, sodium and protein       This is a list of the screening recommended for you and due dates:  Health Maintenance  Topic Date Due  .  Hepatitis C: One time screening is recommended by Center for Disease Control  (CDC) for  adults born from [birth year] through 1963.   03-14-1946  . DEXA scan (bone density measurement)  06/03/2012  . Pneumonia vaccines (1 of 2 - PCV13) 06/03/2012  . Flu Shot  11/19/2014  . Mammogram  09/23/2016  . Tetanus Vaccine  06/06/2018  . Colon Cancer Screening  11/05/2022  . Shingles Vaccine  Completed     For spouse Sleep Apnea  Sleep apnea is a sleep disorder characterized by abnormal pauses in breathing while you sleep. When your breathing pauses, the level of oxygen in your blood decreases. This causes you to move out of deep sleep and into light sleep. As a result, your  quality of sleep is poor, and the system that carries your blood throughout your body (cardiovascular system) experiences stress. If sleep apnea remains untreated, the following conditions can develop:  High blood pressure (hypertension).  Coronary artery disease.  Inability to achieve or maintain an erection (impotence).  Impairment of your thought process (cognitive dysfunction). There are three types of sleep apnea:  Obstructive sleep apnea--Pauses in breathing during sleep because of a blocked airway.  Central sleep apnea--Pauses in breathing during sleep because the area of the brain that controls your breathing does not send the correct signals to the muscles that control breathing.  Mixed sleep apnea--A combination of both obstructive and central sleep apnea. RISK FACTORS The following risk factors can increase your risk of developing sleep apnea:  Being overweight.  Smoking.  Having narrow passages in your nose and throat.  Being of older age.  Being female.  Alcohol use.  Sedative and tranquilizer use.  Ethnicity. Among individuals younger than 35 years, African Americans are at increased risk of sleep apnea. SYMPTOMS   Difficulty staying asleep.  Daytime sleepiness and fatigue.  Loss of energy.  Irritability.  Loud, heavy snoring.  Morning headaches.  Trouble concentrating.  Forgetfulness.  Decreased interest in sex.  Unexplained sleepiness. DIAGNOSIS  In order to diagnose sleep apnea, your caregiver will perform a physical examination. A sleep study done in the comfort of your own home  may be appropriate if you are otherwise healthy. Your caregiver may also recommend that you spend the night in a sleep lab. In the sleep lab, several monitors record information about your heart, lungs, and brain while you sleep. Your leg and arm movements and blood oxygen level are also recorded. TREATMENT The following actions may help to resolve mild sleep  apnea:  Sleeping on your side.   Using a decongestant if you have nasal congestion.   Avoiding the use of depressants, including alcohol, sedatives, and narcotics.   Losing weight and modifying your diet if you are overweight. There also are devices and treatments to help open your airway:  Oral appliances. These are custom-made mouthpieces that shift your lower jaw forward and slightly open your bite. This opens your airway.  Devices that create positive airway pressure. This positive pressure "splints" your airway open to help you breathe better during sleep. The following devices create positive airway pressure:  Continuous positive airway pressure (CPAP) device. The CPAP device creates a continuous level of air pressure with an air pump. The air is delivered to your airway through a mask while you sleep. This continuous pressure keeps your airway open.  Nasal expiratory positive airway pressure (EPAP) device. The EPAP device creates positive air pressure as you exhale. The device consists of single-use valves, which are inserted into each nostril and held in place by adhesive. The valves create very little resistance when you inhale but create much more resistance when you exhale. That increased resistance creates the positive airway pressure. This positive pressure while you exhale keeps your airway open, making it easier to breath when you inhale again.  Bilevel positive airway pressure (BPAP) device. The BPAP device is used mainly in patients with central sleep apnea. This device is similar to the CPAP device because it also uses an air pump to deliver continuous air pressure through a mask. However, with the BPAP machine, the pressure is set at two different levels. The pressure when you exhale is lower than the pressure when you inhale.  Surgery. Typically, surgery is only done if you cannot comply with less invasive treatments or if the less invasive treatments do not improve your  condition. Surgery involves removing excess tissue in your airway to create a wider passage way.   This information is not intended to replace advice given to you by your health care provider. Make sure you discuss any questions you have with your health care provider.   Document Released: 03/27/2002 Document Revised: 04/27/2014 Document Reviewed: 08/13/2011 Elsevier Interactive Patient Education 2016 [place].   Bone Densitometry Bone densitometry is an imaging test that uses a special X-ray to measure the amount of calcium and other minerals in your bones (bone density). This test is also known as a bone mineral density test or dual-energy X-ray absorptiometry (DXA). The test can measure bone density at your hip and your spine. It is similar to having a regular X-ray. You may have this test to:  Diagnose a condition that causes weak or thin bones (osteoporosis).  Predict your risk of a broken bone (fracture).  Determine how well osteoporosis treatment is working. LET [facility] CARE PROVIDER KNOW ABOUT:  Any allergies you have.  All medicines you are taking, including vitamins, herbs, eye drops, creams, and over-the-counter medicines.  Previous problems you or members of your family have had with the use of anesthetics.  Any blood disorders you have.  Previous surgeries you have had.  Medical conditions you  have.  Possibility of pregnancy.  Any other medical test you had within the previous 14 days that used contrast material. RISKS AND COMPLICATIONS Generally, this is a safe procedure. However, problems can occur and may include the following:  This test exposes you to a very small amount of radiation.  The risks of radiation exposure may be greater to unborn children. BEFORE THE PROCEDURE  Do not take any calcium supplements for 24 hours before having the test. You can otherwise eat and drink what you usually do.  Take off all metal jewelry, eyeglasses, dental  appliances, and any other metal objects. PROCEDURE  You may lie on an exam table. There will be an X-ray generator below you and an imaging device above you.  Other devices, such as boxes or braces, may be used to position your body properly for the scan.  You will need to lie still while the machine slowly scans your body.  The images will show up on a computer monitor. AFTER THE PROCEDURE You may need more testing at a later time.   This information is not intended to replace advice given to you by your health care provider. Make sure you discuss any questions you have with your health care provider.   Document Released: 04/28/2004 Document Revised: 04/27/2014 Document Reviewed: 09/14/2013 Elsevier Interactive Patient Education 2016 [employer]  Fat and Cholesterol Restricted Diet High levels of fat and cholesterol in your blood may lead to various health problems, such as diseases of the heart, blood vessels, gallbladder, liver, and pancreas. Fats are concentrated sources of energy that come in various forms. Certain types of fat, including saturated fat, may be harmful in excess. Cholesterol is a substance needed by your body in small amounts. Your body makes all the cholesterol it needs. Excess cholesterol comes from the food you eat. When you have high levels of cholesterol and saturated fat in your blood, health problems can develop because the excess fat and cholesterol will gather along the walls of your blood vessels, causing them to narrow. Choosing the right foods will help you control your intake of fat and cholesterol. This will help keep the levels of these substances in your blood within normal limits and reduce your risk of disease. WHAT IS MY PLAN? Your health care provider recommends that you:  Get no more than __________ % of the total calories in your daily diet from fat.  Limit your intake of saturated fat to less than ______% of your total calories each  day.  Limit the amount of cholesterol in your diet to less than _________mg per day. WHAT TYPES OF FAT SHOULD I CHOOSE?  Choose healthy fats more often. Choose monounsaturated and polyunsaturated fats, such as olive and canola oil, flaxseeds, walnuts, almonds, and seeds.  Eat more omega-3 fats. Good choices include salmon, mackerel, sardines, tuna, flaxseed oil, and ground flaxseeds. Aim to eat fish at least two times a week.  Limit saturated fats. Saturated fats are primarily found in animal products, such as meats, butter, and cream. Plant sources of saturated fats include palm oil, palm kernel oil, and coconut oil.  Avoid foods with partially hydrogenated oils in them. These contain trans fats. Examples of foods that contain trans fats are stick margarine, some tub margarines, cookies, crackers, and other baked goods. WHAT GENERAL GUIDELINES DO I NEED TO FOLLOW? These guidelines for healthy eating will help you control your intake of fat and cholesterol:  Check food labels carefully to identify foods with trans  fats or high amounts of saturated fat.  Fill one half of your plate with vegetables and green salads.  Fill one fourth of your plate with whole grains. Look for the word "whole" as the first word in the ingredient list.  Fill one fourth of your plate with lean protein foods.  Limit fruit to two servings a day. Choose fruit instead of juice.  Eat more foods that contain soluble fiber. Examples of foods that contain this type of fiber are apples, broccoli, carrots, beans, peas, and barley. Aim to get 20-30 g of fiber per day.  Eat more home-cooked food and less restaurant, buffet, and fast food.  Limit or avoid alcohol.  Limit foods high in starch and sugar.  Limit fried foods.  Cook foods using methods other than frying. Baking, boiling, grilling, and broiling are all great options.  Lose weight if you are overweight. Losing just 5-10% of your initial body weight can  help your overall health and prevent diseases such as diabetes and heart disease. WHAT FOODS CAN I EAT? Grains Whole grains, such as whole wheat or whole grain breads, crackers, cereals, and pasta. Unsweetened oatmeal, bulgur, barley, quinoa, or brown rice. Corn or whole wheat flour tortillas. Vegetables Fresh or frozen vegetables (raw, steamed, roasted, or grilled). Green salads. Fruits All fresh, canned (in natural juice), or frozen fruits. Meat and Other Protein Products Ground beef (85% or leaner), grass-fed beef, or beef trimmed of fat. Skinless chicken or [place]. Ground chicken or [place]. Pork trimmed of fat. All fish and seafood. Eggs. Dried beans, peas, or lentils. Unsalted nuts or seeds. Unsalted canned or dry beans. Dairy Low-fat dairy products, such as skim or 1% milk, 2% or reduced-fat cheeses, low-fat ricotta or cottage cheese, or plain low-fat yogurt. Fats and Oils Tub margarines without trans fats. Light or reduced-fat mayonnaise and salad dressings. Avocado. Olive, canola, sesame, or safflower oils. Natural peanut or almond butter (choose ones without added sugar and oil). The items listed above may not be a complete list of recommended foods or beverages. Contact your dietitian for more options. WHAT FOODS ARE NOT RECOMMENDED? Grains White bread. White pasta. White rice. Cornbread. Bagels, pastries, and croissants. Crackers that contain trans fat. Vegetables White potatoes. Corn. Creamed or fried vegetables. Vegetables in a cheese sauce. Fruits Dried fruits. Canned fruit in light or heavy syrup. Fruit juice. Meat and Other Protein Products Fatty cuts of meat. Ribs, chicken wings, bacon, sausage, bologna, salami, chitterlings, fatback, hot dogs, bratwurst, and packaged luncheon meats. Liver and organ meats. Dairy Whole or 2% milk, cream, half-and-half, and cream cheese. Whole milk cheeses. Whole-fat or sweetened yogurt. Full-fat cheeses. Nondairy creamers and whipped  toppings. Processed cheese, cheese spreads, or cheese curds. Sweets and Desserts Corn syrup, sugars, honey, and molasses. Candy. Jam and jelly. Syrup. Sweetened cereals. Cookies, pies, cakes, donuts, muffins, and ice cream. Fats and Oils Butter, stick margarine, lard, shortening, ghee, or bacon fat. Coconut, palm kernel, or palm oils. Beverages Alcohol. Sweetened drinks (such as sodas, lemonade, and fruit drinks or punches). The items listed above may not be a complete list of foods and beverages to avoid. Contact your dietitian for more information.   This information is not intended to replace advice given to you by your health care provider. Make sure you discuss any questions you have with your health care provider.   Document Released: 04/06/2005 Document Revised: 04/27/2014 Document Reviewed: 07/05/2013 Elsevier Interactive Patient Education 2016 [place] Maintenance, Female Adopting a healthy lifestyle  and getting preventive care can go a long way to promote health and wellness. Talk with your health care provider about what schedule of regular examinations is right for you. This is a good chance for you to check in with your provider about disease prevention and staying healthy. In between checkups, there are plenty of things you can do on your own. Experts have done a lot of research about which lifestyle changes and preventive measures are most likely to keep you healthy. Ask your health care provider for more information. WEIGHT AND DIET  Eat a healthy diet  Be sure to include plenty of vegetables, fruits, low-fat dairy products, and lean protein.  Do not eat a lot of foods high in solid fats, added sugars, or salt.  Get regular exercise. This is one of the most important things you can do for your health.  Most adults should exercise for at least 150 minutes each week. The exercise should increase your heart rate and make you sweat (moderate-intensity  exercise).  Most adults should also do strengthening exercises at least twice a week. This is in addition to the moderate-intensity exercise.  Maintain a healthy weight  Body mass index (BMI) is a measurement that can be used to identify possible weight problems. It estimates body fat based on height and weight. Your health care provider can help determine your BMI and help you achieve or maintain a healthy weight.  For females 20 years of age and older:   A BMI below 18.5 is considered underweight.  A BMI of 18.5 to 24.9 is normal.  A BMI of 25 to 29.9 is considered overweight.  A BMI of 30 and above is considered obese.  Watch levels of cholesterol and blood lipids  You should start having your blood tested for lipids and cholesterol at 68 years of age, then have this test every 5 years.  You may need to have your cholesterol levels checked more often if:  Your lipid or cholesterol levels are high.  You are older than 68 years of age.  You are at high risk for heart disease.  CANCER SCREENING   Lung Cancer  Lung cancer screening is recommended for adults 69-66 years old who are at high risk for lung cancer because of a history of smoking.  A yearly low-dose CT scan of the lungs is recommended for people who:  Currently smoke.  Have quit within the past 15 years.  Have at least a 30-pack-year history of smoking. A pack year is smoking an average of one pack of cigarettes a day for 1 year.  Yearly screening should continue until it has been 15 years since you quit.  Yearly screening should stop if you develop a health problem that would prevent you from having lung cancer treatment.  Breast Cancer  Practice breast self-awareness. This means understanding how your breasts normally appear and feel.  It also means doing regular breast self-exams. Let your health care provider know about any changes, no matter how small.  If you are in your 20s or 30s, you should  have a clinical breast exam (CBE) by a health care provider every 1-3 years as part of a regular health exam.  If you are 61 or older, have a CBE every year. Also consider having a breast X-ray (mammogram) every year.  If you have a family history of breast cancer, talk to your health care provider about genetic screening.  If you are at high risk for breast  cancer, talk to your health care provider about having an MRI and a mammogram every year.  Breast cancer gene (BRCA) assessment is recommended for women who have family members with BRCA-related cancers. BRCA-related cancers include:  Breast.  Ovarian.  Tubal.  Peritoneal cancers.  Results of the assessment will determine the need for genetic counseling and BRCA1 and BRCA2 testing. Cervical Cancer Your health care provider may recommend that you be screened regularly for cancer of the pelvic organs (ovaries, uterus, and vagina). This screening involves a pelvic examination, including checking for microscopic changes to the surface of your cervix (Pap test). You may be encouraged to have this screening done every 3 years, beginning at age 55.  For women ages 63-65, health care providers may recommend pelvic exams and Pap testing every 3 years, or they may recommend the Pap and pelvic exam, combined with testing for human papilloma virus (HPV), every 5 years. Some types of HPV increase your risk of cervical cancer. Testing for HPV may also be done on women of any age with unclear Pap test results.  Other health care providers may not recommend any screening for nonpregnant women who are considered low risk for pelvic cancer and who do not have symptoms. Ask your health care provider if a screening pelvic exam is right for you.  If you have had past treatment for cervical cancer or a condition that could lead to cancer, you need Pap tests and screening for cancer for at least 20 years after your treatment. If Pap tests have been  discontinued, your risk factors (such as having a new sexual partner) need to be reassessed to determine if screening should resume. Some women have medical problems that increase the chance of getting cervical cancer. In these cases, your health care provider may recommend more frequent screening and Pap tests. Colorectal Cancer  This type of cancer can be detected and often prevented.  Routine colorectal cancer screening usually begins at 68 years of age and continues through 68 years of age.  Your health care provider may recommend screening at an earlier age if you have risk factors for colon cancer.  Your health care provider may also recommend using home test kits to check for hidden blood in the stool.  A small camera at the end of a tube can be used to examine your colon directly (sigmoidoscopy or colonoscopy). This is done to check for the earliest forms of colorectal cancer.  Routine screening usually begins at age 61.  Direct examination of the colon should be repeated every 5-10 years through 68 years of age. However, you may need to be screened more often if early forms of precancerous polyps or small growths are found. Skin Cancer  Check your skin from head to toe regularly.  Tell your health care provider about any new moles or changes in moles, especially if there is a change in a mole's shape or color.  Also tell your health care provider if you have a mole that is larger than the size of a pencil eraser.  Always use sunscreen. Apply sunscreen liberally and repeatedly throughout the day.  Protect yourself by wearing long sleeves, pants, a wide-brimmed hat, and sunglasses whenever you are outside. HEART DISEASE, DIABETES, AND HIGH BLOOD PRESSURE   High blood pressure causes heart disease and increases the risk of stroke. High blood pressure is more likely to develop in:  People who have blood pressure in the high end of the normal range (130-139/85-89 mm Hg).  People  who are overweight or obese.  People who are African American.  If you are 97-76 years of age, have your blood pressure checked every 3-5 years. If you are 25 years of age or older, have your blood pressure checked every year. You should have your blood pressure measured twice--once when you are at a hospital or clinic, and once when you are not at a hospital or clinic. Record the average of the two measurements. To check your blood pressure when you are not at a hospital or clinic, you can use:  An automated blood pressure machine at a pharmacy.  A home blood pressure monitor.  If you are between 61 years and 72 years old, ask your health care provider if you should take aspirin to prevent strokes.  Have regular diabetes screenings. This involves taking a blood sample to check your fasting blood sugar level.  If you are at a normal weight and have a low risk for diabetes, have this test once every three years after 68 years of age.  If you are overweight and have a high risk for diabetes, consider being tested at a younger age or more often. PREVENTING INFECTION  Hepatitis B  If you have a higher risk for hepatitis B, you should be screened for this virus. You are considered at high risk for hepatitis B if:  You were born in a country where hepatitis B is common. Ask your health care provider which countries are considered high risk.  Your parents were born in a high-risk country, and you have not been immunized against hepatitis B (hepatitis B vaccine).  You have HIV or AIDS.  You use needles to inject street drugs.  You live with someone who has hepatitis B.  You have had sex with someone who has hepatitis B.  You get hemodialysis treatment.  You take certain medicines for conditions, including cancer, organ transplantation, and autoimmune conditions. Hepatitis C  Blood testing is recommended for:  Everyone born from [birth year] through 1963.  Anyone with known risk factors for  hepatitis C. Sexually transmitted infections (STIs)  You should be screened for sexually transmitted infections (STIs) including gonorrhea and chlamydia if:  You are sexually active and are younger than 68 years of age.  You are older than 68 years of age and your health care provider tells you that you are at risk for this type of infection.  Your sexual activity has changed since you were last screened and you are at an increased risk for chlamydia or gonorrhea. Ask your health care provider if you are at risk.  If you do not have HIV, but are at risk, it may be recommended that you take a prescription medicine daily to prevent HIV infection. This is called pre-exposure prophylaxis (PrEP). You are considered at risk if:  You are sexually active and do not regularly use condoms or know the HIV status of your partner(s).  You take drugs by injection.  You are sexually active with a partner who has HIV. Talk with your health care provider about whether you are at high risk of being infected with HIV. If you choose to begin PrEP, you should first be tested for HIV. You should then be tested every 3 months for as long as you are taking PrEP.  PREGNANCY   If you are premenopausal and you may become pregnant, ask your health care provider about preconception counseling.  If you may become pregnant, take 400 to 800 micrograms (mcg)  of folic acid every day.  If you want to prevent pregnancy, talk to your health care provider about birth control (contraception). OSTEOPOROSIS AND MENOPAUSE   Osteoporosis is a disease in which the bones lose minerals and strength with aging. This can result in serious bone fractures. Your risk for osteoporosis can be identified using a bone density scan.  If you are 81 years of age or older, or if you are at risk for osteoporosis and fractures, ask your health care provider if you should be screened.  Ask your health care provider whether you should take a  calcium or vitamin D supplement to lower your risk for osteoporosis.  Menopause may have certain physical symptoms and risks.  Hormone replacement therapy may reduce some of these symptoms and risks. Talk to your health care provider about whether hormone replacement therapy is right for you.  HOME CARE INSTRUCTIONS   Schedule regular health, dental, and eye exams.  Stay current with your immunizations.   Do not use any tobacco products including cigarettes, chewing tobacco, or electronic cigarettes.  If you are pregnant, do not drink alcohol.  If you are breastfeeding, limit how much and how often you drink alcohol.  Limit alcohol intake to no more than 1 drink per day for nonpregnant women. One drink equals 12 ounces of beer, 5 ounces of wine, or 1 ounces of hard liquor.  Do not use street drugs.  Do not share needles.  Ask your health care provider for help if you need support or information about quitting drugs.  Tell your health care provider if you often feel depressed.  Tell your health care provider if you have ever been abused or do not feel safe at home.   This information is not intended to replace advice given to you by your health care provider. Make sure you discuss any questions you have with your health care provider.   Document Released: 10/20/2010 Document Revised: 04/27/2014 Document Reviewed: 03/08/2013 Elsevier Interactive Patient Education [employer].

## 2015-08-12 ENCOUNTER — Other Ambulatory Visit (INDEPENDENT_AMBULATORY_CARE_PROVIDER_SITE_OTHER): Payer: PPO

## 2015-08-12 DIAGNOSIS — Z0000 Encounter for general adult medical examination without abnormal findings: Secondary | ICD-10-CM | POA: Diagnosis not present

## 2015-08-12 LAB — CBC WITH DIFFERENTIAL/PLATELET
BASOS PCT: 0.6 % (ref 0.0–3.0)
Basophils Absolute: 0 10*3/uL (ref 0.0–0.1)
EOS PCT: 2.8 % (ref 0.0–5.0)
Eosinophils Absolute: 0.2 10*3/uL (ref 0.0–0.7)
HCT: 39.5 % (ref 36.0–46.0)
Hemoglobin: 13.3 g/dL (ref 12.0–15.0)
LYMPHS ABS: 2.7 10*3/uL (ref 0.7–4.0)
Lymphocytes Relative: 34 % (ref 12.0–46.0)
MCHC: 33.7 g/dL (ref 30.0–36.0)
MCV: 89.5 fl (ref 78.0–100.0)
MONO ABS: 0.7 10*3/uL (ref 0.1–1.0)
MONOS PCT: 8.3 % (ref 3.0–12.0)
NEUTROS ABS: 4.3 10*3/uL (ref 1.4–7.7)
NEUTROS PCT: 54.3 % (ref 43.0–77.0)
Platelets: 291 10*3/uL (ref 150.0–400.0)
RBC: 4.41 Mil/uL (ref 3.87–5.11)
RDW: 13.5 % (ref 11.5–15.5)
WBC: 7.9 10*3/uL (ref 4.0–10.5)

## 2015-08-12 LAB — URINALYSIS, ROUTINE W REFLEX MICROSCOPIC
BILIRUBIN URINE: NEGATIVE
HGB URINE DIPSTICK: NEGATIVE
Ketones, ur: NEGATIVE
LEUKOCYTES UA: NEGATIVE
Nitrite: NEGATIVE
RBC / HPF: NONE SEEN (ref 0–?)
Specific Gravity, Urine: 1.01 (ref 1.000–1.030)
TOTAL PROTEIN, URINE-UPE24: NEGATIVE
URINE GLUCOSE: NEGATIVE
Urobilinogen, UA: 0.2 (ref 0.0–1.0)
WBC, UA: NONE SEEN (ref 0–?)
pH: 6 (ref 5.0–8.0)

## 2015-08-12 LAB — LIPID PANEL
CHOL/HDL RATIO: 3
Cholesterol: 161 mg/dL (ref 0–200)
HDL: 60.8 mg/dL (ref >39.00)
LDL CALC: 79 mg/dL (ref 0–99)
NONHDL: 100.64
TRIGLYCERIDES: 108 mg/dL (ref 0.0–149.0)
VLDL: 21.6 mg/dL (ref 0.0–40.0)

## 2015-08-12 LAB — BASIC METABOLIC PANEL
BUN: 14 mg/dL (ref 6–23)
CALCIUM: 9.7 mg/dL (ref 8.4–10.5)
CHLORIDE: 100 meq/L (ref 96–112)
CO2: 31 mEq/L (ref 19–32)
Creatinine, Ser: 1.06 mg/dL (ref 0.40–1.20)
GFR: 66.26 mL/min (ref >60.00)
Glucose, Bld: 94 mg/dL (ref 70–99)
Potassium: 3.5 mEq/L (ref 3.5–5.1)
Sodium: 139 mEq/L (ref 135–145)

## 2015-08-12 LAB — HEPATIC FUNCTION PANEL
ALK PHOS: 68 U/L (ref 39–117)
ALT: 18 U/L (ref 0–35)
AST: 18 U/L (ref 0–37)
Albumin: 4.2 g/dL (ref 3.5–5.2)
BILIRUBIN DIRECT: 0.1 mg/dL (ref 0.0–0.3)
BILIRUBIN TOTAL: 0.6 mg/dL (ref 0.2–1.2)
Total Protein: 7.4 g/dL (ref 6.0–8.3)

## 2015-08-12 LAB — TSH: TSH: 3.02 u[IU]/mL (ref 0.35–4.50)

## 2015-08-14 ENCOUNTER — Ambulatory Visit (INDEPENDENT_AMBULATORY_CARE_PROVIDER_SITE_OTHER): Payer: PPO | Admitting: Internal Medicine

## 2015-08-14 ENCOUNTER — Encounter: Payer: Self-pay | Admitting: Internal Medicine

## 2015-08-14 VITALS — BP 136/80 | HR 71 | Temp 98.4°F | Resp 20 | Wt 173.0 lb

## 2015-08-14 DIAGNOSIS — I10 Essential (primary) hypertension: Secondary | ICD-10-CM

## 2015-08-14 DIAGNOSIS — M21611 Bunion of right foot: Secondary | ICD-10-CM | POA: Diagnosis not present

## 2015-08-14 DIAGNOSIS — Z1159 Encounter for screening for other viral diseases: Secondary | ICD-10-CM | POA: Diagnosis not present

## 2015-08-14 DIAGNOSIS — Z0001 Encounter for general adult medical examination with abnormal findings: Secondary | ICD-10-CM

## 2015-08-14 DIAGNOSIS — R6889 Other general symptoms and signs: Secondary | ICD-10-CM

## 2015-08-14 DIAGNOSIS — E785 Hyperlipidemia, unspecified: Secondary | ICD-10-CM

## 2015-08-14 MED ORDER — PRAVASTATIN SODIUM 40 MG PO TABS: 40.0000 mg | ORAL_TABLET | Freq: Every day | ORAL | Status: DC

## 2015-08-14 NOTE — Progress Notes (Signed)
Pre visit review using our clinic review tool, if applicable. No additional management support is needed unless otherwise documented below in the visit note. 

## 2015-08-14 NOTE — Patient Instructions (Addendum)
You had the Prevnar pneumonia shot today  Please continue all other medications as before, and refills have been done if requested.  Please have the pharmacy call with any other refills you may need.  Please continue your efforts at being more active, low cholesterol diet, and weight control.  You are otherwise up to date with prevention measures today.  Please keep your appointments with your specialists as you may have planned  You will be contacted regarding the referral for: Podiatry  Please return in 1 year for your yearly visit, or sooner if needed, with Lab testing done 3-5 days before

## 2015-08-14 NOTE — Progress Notes (Signed)
Subjective:    Patient ID: Deborah Schultz, female    DOB: [birth year], 70 y.o.   MRN: [id number]  HPI  Here for wellness and f/u;  Overall doing ok;  Pt denies Chest pain, worsening SOB, DOE, wheezing, orthopnea, PND, worsening LE edema, palpitations, dizziness or syncope.  Pt denies neurological change such as new headache, facial or extremity weakness.  Pt denies polydipsia, polyuria, or low sugar symptoms. Pt states overall good compliance with treatment and medications, good tolerability, and has been trying to follow appropriate diet.  Pt denies worsening depressive symptoms, suicidal ideation or panic. No fever, night sweats, wt loss, loss of appetite, or other constitutional symptoms.  Pt states good ability with ADL's, has low fall risk, home safety reviewed and adequate, no other significant changes in hearing or vision, and only occasionally active with exercise. Denies worsening reflux, abd pain, dysphagia, n/v, bowel change or blood, though does a concern about bowel movement which lately have been fine, but has had some intermittent unusual for her soft stools.  Also has worsening right foot first MTP bunion with increased pain, and now seems to walk more directly on the instep as well. Past Medical History  Diagnosis Date  . Diverticulitis   . HLD (hyperlipidemia)   . HTN (hypertension)   . OAB (overactive bladder)   . Degenerative disc disease, cervical   . Right carpal tunnel syndrome   . Anemia     iron deficiency  . Chronic pruritus   . ANEMIA-IRON DEFICIENCY 06/06/2008  . BICEPS TENDINITIS, LEFT 05/06/2010  . BRONCHITIS, ACUTE 06/18/2008  . CALLUSES, FEET, BILATERAL 06/06/2008  . CONSTIPATION 06/06/2008  . ALOPECIA 06/06/2008  . Cough 06/18/2008  . DIVERTICULITIS, HX OF 06/06/2008  . ELBOW PAIN 05/26/2010  . FATIGUE 06/18/2008  . HYPERLIPIDEMIA 06/06/2008  . OVERACTIVE BLADDER 06/06/2008  . PERIPHERAL EDEMA 02/19/2009  . SINUSITIS- ACUTE-NOS 01/08/2009  . Peripheral edema 02/26/2011    Past Surgical History  Procedure Laterality Date  . Abdominal hysterectomy    . Oophorectomy    . Ectopic pregnancy surgery    . Appendectomy    . Breast biopsy    . Bunionectomy    . Right thumb surgery    . Cataract extraction      bilateral 11/2009-no longer wears glasses    reports that she has never smoked. She has never used smokeless tobacco. She reports that she drinks alcohol. She reports that she does not use illicit drugs. family history includes Cancer in her maternal aunt and maternal uncle; Diabetes in her brother; Hypertension in her mother; Ovarian cancer in her mother. There is no history of Colon cancer. Allergies  Allergen Reactions  . Nystatin Rash  . Esomeprazole Magnesium     REACTION: Headache  . Fexofenadine     REACTION: Hives  . Sulfonamide Derivatives     Per pt: unknown reaction   Current Outpatient Prescriptions on File Prior to Visit  Medication Sig Dispense Refill  . amLODipine (NORVASC) 5 MG tablet TAKE ONE TABLET BY MOUTH ONCE DAILY 90 tablet 3  . aspirin 81 MG tablet Take 81 mg by mouth daily.      Marland Kitchen. estradiol (ESTRACE) 1 MG tablet Take 1 tablet (1 mg total) by mouth daily. 90 tablet 3  . furosemide (LASIX) 40 MG tablet TAKE ONE TABLET BY MOUTH TWICE DAILY WITH THE SECOND DOSE ONLY TAKE AS NEEDED FOR PERSISTANT SWELLING 180 tablet 3  . Glucosamine-Chondroitin (MOVE FREE PO) Take 1 capsule by  mouth daily.    . hydrOXYzine (ATARAX/VISTARIL) 25 MG tablet Take 1 tablet (25 mg total) by mouth at bedtime as needed. 30 tablet 5  . irbesartan (AVAPRO) 300 MG tablet TAKE ONE TABLET BY MOUTH AT BEDTIME 90 tablet 0  . Multiple Vitamin (MULTIVITAMIN) tablet Take 1 tablet by mouth daily.      Marland Kitchen oxybutynin (DITROPAN) 5 MG tablet TAKE ONE TABLET BY MOUTH TWICE DAILY 180 tablet 3  . propranolol ER (INDERAL LA) 80 MG 24 hr capsule Take 1 capsule (80 mg total) by mouth daily. 90 capsule 0  . VITAMIN D, CHOLECALCIFEROL, PO Take by mouth daily.       No current  facility-administered medications on file prior to visit.   Review of Systems Constitutional: Negative for increased diaphoresis, or other activity, appetite or siginficant weight change other than noted HENT: Negative for worsening hearing loss, ear pain, facial swelling, mouth sores and neck stiffness.   Eyes: Negative for other worsening pain, redness or visual disturbance.  Respiratory: Negative for choking or stridor Cardiovascular: Negative for other chest pain and palpitations.  Gastrointestinal: Negative for worsening diarrhea, blood in stool, or abdominal distention Genitourinary: Negative for hematuria, flank pain or change in urine volume.  Musculoskeletal: Negative for myalgias or other joint complaints.  Skin: Negative for other color change and wound or drainage.  Neurological: Negative for syncope and numbness. other than noted Hematological: Negative for adenopathy. or other swelling Psychiatric/Behavioral: Negative for hallucinations, SI, self-injury, decreased concentration or other worsening agitation.      Objective:   Physical Exam BP 136/80 mmHg  Pulse 71  Temp(Src) 98.4 F (36.9 C) (Oral)  Resp 20  Wt 173 lb (78.472 kg)  SpO2 98% VS noted,  Constitutional: Pt is oriented to person, place, and time. Appears well-developed and well-nourished, in no significant distress Head: Normocephalic and atraumatic  Eyes: Conjunctivae and EOM are normal. Pupils are equal, round, and reactive to light Right Ear: External ear normal.  Left Ear: External ear normal Nose: Nose normal.  Mouth/Throat: Oropharynx is clear and moist  Neck: Normal range of motion. Neck supple. No JVD present. No tracheal deviation present or significant neck LA or mass Cardiovascular: Normal rate, regular rhythm, normal heart sounds and intact distal pulses.   Pulmonary/Chest: Effort normal and breath sounds without rales or wheezing  Abdominal: Soft. Bowel sounds are normal. NT. No HSM    Musculoskeletal: Normal range of motion. Exhibits no edema Lymphadenopathy: Has no cervical adenopathy.  Neurological: Pt is alert and oriented to person, place, and time. Pt has normal reflexes. No cranial nerve deficit. Motor grossly intact Skin: Skin is warm and dry. No rash noted or new ulcers Psychiatric:  Has normal mood and affect. Behavior is normal.  Right foot bunion mild swelling, tender, erythema       Assessment & Plan:

## 2015-08-14 NOTE — Assessment & Plan Note (Signed)

## 2015-08-15 ENCOUNTER — Telehealth: Payer: Self-pay

## 2015-08-15 MED ORDER — TRIAMCINOLONE ACETONIDE 0.1 % EX CREA: 1.0000 "application " | TOPICAL_CREAM | Freq: Two times a day (BID) | CUTANEOUS | Status: DC

## 2015-08-15 NOTE — Telephone Encounter (Signed)
Please advise 

## 2015-08-15 NOTE — Telephone Encounter (Signed)
triam cr sent erx

## 2015-08-15 NOTE — Telephone Encounter (Signed)
Patient states [name] told her she was going to call in some RX and she does not remember the name it was some kind of cream for a rash. PLease advise or follow up. Thank you

## 2015-08-18 NOTE — Assessment & Plan Note (Signed)
stable overall by history and exam, recent data reviewed with pt, and pt to continue medical treatment as before,  to f/u any worsening symptoms or concerns Lab Results  Component Value Date   LDLCALC 79 08/12/2015

## 2015-08-18 NOTE — Assessment & Plan Note (Addendum)
Mild to mod, for podiatry referral, to f/u any worsening symptoms or concerns  In addition to the time spent performing CPE, I spent an additional 15 minutes face to face,in which greater than 50% of this time was spent in counseling and coordination of care for patient's illness as documented.

## 2015-08-18 NOTE — Assessment & Plan Note (Signed)
stable overall by history and exam, recent data reviewed with pt, and pt to continue medical treatment as before,  to f/u any worsening symptoms or concerns BP Readings from Last 3 Encounters:  08/14/15 136/80  07/19/15 120/70  12/28/14 124/88

## 2015-08-27 ENCOUNTER — Other Ambulatory Visit: Payer: Self-pay | Admitting: Internal Medicine

## 2015-08-27 DIAGNOSIS — R921 Mammographic calcification found on diagnostic imaging of breast: Secondary | ICD-10-CM

## 2015-08-27 DIAGNOSIS — N6002 Solitary cyst of left breast: Secondary | ICD-10-CM

## 2015-08-30 ENCOUNTER — Other Ambulatory Visit: Payer: Self-pay | Admitting: Internal Medicine

## 2015-09-05 ENCOUNTER — Other Ambulatory Visit: Payer: Self-pay

## 2015-09-05 MED ORDER — PROPRANOLOL HCL ER 80 MG PO CP24: ORAL_CAPSULE | ORAL | Status: DC

## 2015-09-06 ENCOUNTER — Other Ambulatory Visit: Payer: Self-pay | Admitting: Internal Medicine

## 2015-09-30 ENCOUNTER — Ambulatory Visit
Admission: RE | Admit: 2015-09-30 | Discharge: 2015-09-30 | Disposition: A | Discharge status: Home / Self Care | Payer: PPO | Source: Ambulatory Visit | Attending: Internal Medicine | Admitting: Internal Medicine

## 2015-09-30 DIAGNOSIS — N6002 Solitary cyst of left breast: Secondary | ICD-10-CM

## 2015-09-30 DIAGNOSIS — R921 Mammographic calcification found on diagnostic imaging of breast: Secondary | ICD-10-CM

## 2015-09-30 DIAGNOSIS — N63 Unspecified lump in breast: Secondary | ICD-10-CM | POA: Diagnosis not present

## 2015-11-12 DIAGNOSIS — Z6833 Body mass index (BMI) 33.0-33.9, adult: Secondary | ICD-10-CM | POA: Diagnosis not present

## 2015-11-12 DIAGNOSIS — Z01419 Encounter for gynecological examination (general) (routine) without abnormal findings: Secondary | ICD-10-CM | POA: Diagnosis not present

## 2015-11-15 ENCOUNTER — Telehealth: Payer: Self-pay | Admitting: Emergency Medicine

## 2015-11-15 NOTE — Telephone Encounter (Signed)
Pt called and stated she came in on 08/14/15 for a CPE. She discussed a problem with her foot at the appointment. She is stating she was charged for 2 different things for the visit. She is wondering why since he asked her how her over all health was. She stated he didn't examine her foot or anything. The 2 codes were 99213- Office out pt and 04599- Preventative care. Please follow up and give her a call back thanks.

## 2015-11-18 ENCOUNTER — Other Ambulatory Visit: Payer: Self-pay | Admitting: Internal Medicine

## 2015-11-20 NOTE — Telephone Encounter (Signed)
Spoke with patient and explained the difference between a wellness visit and office/issue visit. Per notes from coding the OV charge is correct due to discussion of established issues/refills/referral.

## 2015-12-03 ENCOUNTER — Other Ambulatory Visit: Payer: Self-pay | Admitting: Internal Medicine

## 2016-01-27 DIAGNOSIS — H5201 Hypermetropia, right eye: Secondary | ICD-10-CM | POA: Diagnosis not present

## 2016-01-28 ENCOUNTER — Telehealth: Payer: Self-pay | Admitting: Internal Medicine

## 2016-01-28 NOTE — Telephone Encounter (Signed)
[facility] - Client TELEPHONE ADVICE RECORD [facility] Patient Name: Deborah LightningLYNDA Cansler DOB: 1945/12/13 Initial Comment Caller states she's having back pain, started on the 28th of September. Still having pain. She was reaching into her hamper to get some clothes and felt a shooting pain in her lower back. Nurse Assessment Nurse: Lane HackerHarley, RN, Elvin SoWindy Date/Time Lamount Cohen(Eastern Time): 01/28/2016 1:41:34 PM Confirm and document reason for call. If symptomatic, describe symptoms. You must click the next button to save text entered. ---Caller states she was reaching into her hamper to get some clothes and felt a shooting pain in her lower back on 01/16/16. Cont. lower back pain, and rates it now at 8-9/10. Has the patient traveled out of the country within the last 30 days? ---Not Applicable Does the patient have any new or worsening symptoms? ---Yes Will a triage be completed? ---Yes Related visit to physician within the last 2 weeks? ---No Does the PT have any chronic conditions? (i.e. diabetes, asthma, etc.) ---Yes List chronic conditions. ---HTN, High cholesterol Is this a behavioral health or substance abuse call? ---No Guidelines Guideline Title Affirmed Question Affirmed Notes Back Pain [1] MODERATE back pain (e.g., interferes with normal activities) AND [2] present > 3 days Final Disposition User See PCP When Office is Open (within 3 days) Lane HackerHarley, RN, Windy Comments Dr. Hillard DankerElizabeth Crawford at 9:30 am on Thursday, as PCP does not any availability today before vacation. Caller aware it is the same office. Referrals REFERRED TO PCP OFFICE Disagree/Comply: Comply

## 2016-01-30 ENCOUNTER — Ambulatory Visit (INDEPENDENT_AMBULATORY_CARE_PROVIDER_SITE_OTHER): Payer: PPO | Admitting: Internal Medicine

## 2016-01-30 DIAGNOSIS — M545 Low back pain, unspecified: Secondary | ICD-10-CM

## 2016-01-30 MED ORDER — PREDNISONE 20 MG PO TABS: 40.0000 mg | ORAL_TABLET | Freq: Every day | ORAL | 0 refills | Status: DC

## 2016-01-30 NOTE — Progress Notes (Signed)
Pre visit review using our clinic review tool, if applicable. No additional management support is needed unless otherwise documented below in the visit note. 

## 2016-01-30 NOTE — Assessment & Plan Note (Signed)
Rx for prednisone burst for 4 days. She has a muscle relaxer at home and wants to try that before we prescribe anything else. She will call back if it does not work.

## 2016-01-30 NOTE — Progress Notes (Signed)
   Subjective:    Patient ID: Deborah Schultz, female    DOB: July 05, 1945, 70 y.o.   MRN: [id number]  HPI The patient is a 70 YO female coming in for low back pain. Started initially on 01/16/16 with doing some laundry and she was pulling laundry out of a basket. This lasted about 4-5 days and then stopped. She then started having the pain again about 1 week later without a clear cause. She has had this for about 6 days now and wanted to get it checked out. No pain in her legs or arms. No numbness in her arms or legs. No change to bowel or bladder habits. No fevers or chills.   Review of Systems  Constitutional: Positive for activity change. Negative for appetite change, fatigue, fever and unexpected weight change.  Respiratory: Negative.   Cardiovascular: Negative.   Gastrointestinal: Negative.   Musculoskeletal: Positive for back pain and myalgias. Negative for arthralgias, gait problem and joint swelling.  Neurological: Negative.       Objective:   Physical Exam  Constitutional: She is oriented to person, place, and time. She appears well-developed and well-nourished.  HENT:  Head: Normocephalic and atraumatic.  Eyes: EOM are normal.  Neck: Normal range of motion.  Cardiovascular: Normal rate and regular rhythm.   Pulmonary/Chest: Effort normal and breath sounds normal.  Abdominal: Soft.  Musculoskeletal: She exhibits tenderness.  Mild tenderness in the lumbar/sacral region without radiation.   Neurological: She is alert and oriented to person, place, and time. Coordination normal.  Skin: Skin is warm and dry.   Vitals:   01/30/16 0933  BP: 140/80  Pulse: 77  Resp: 20  Temp: 98.7 F (37.1 C)  TempSrc: Oral  SpO2: 97%  Weight: 176 lb (79.8 kg)      Assessment & Plan:

## 2016-01-30 NOTE — Patient Instructions (Signed)
We have sent in prednisone for the back pain. Take 2 pill daily for the next 4 days.   It is okay to use the muscle relaxer that you have at home for the pain in the back.   It is important to continue stretching the back once it starts to improve to help avoid injury in the future.   Back Injury Prevention Back injuries can be very painful. They can also be difficult to heal. After having one back injury, you are more likely to injure your back again. It is important to learn how to avoid injuring or re-injuring your back. The following tips can help you to prevent a back injury. WHAT SHOULD I KNOW ABOUT PHYSICAL FITNESS?  Exercise for 30 minutes per day on most days of the week or as directed by your health care provider. Make sure to:  Do aerobic exercises, such as walking, jogging, biking, or swimming.  Do exercises that increase balance and strength, such as tai chi and yoga. These can decrease your risk of falling and injuring your back.  Do stretching exercises to help with flexibility.  Try to develop strong abdominal muscles. Your abdominal muscles provide a lot of the support that is needed by your back.  Maintain a healthy weight. This helps to decrease your risk of a back injury. WHAT SHOULD I KNOW ABOUT MY DIET?  Talk with your health care provider about your overall diet. Take supplements and vitamins only as directed by your health care provider.  Talk with your health care provider about how much calcium and vitamin D you need each day. These nutrients help to prevent weakening of the bones (osteoporosis). Osteoporosis can cause broken (fractured) bones, which lead to back pain.  Include good sources of calcium in your diet, such as dairy products, green leafy vegetables, and products that have had calcium added to them (fortified).  Include good sources of vitamin D in your diet, such as milk and foods that are fortified with vitamin D. WHAT SHOULD I KNOW ABOUT MY  POSTURE?  Sit up straight and stand up straight. Avoid leaning forward when you sit or hunching over when you stand.  Choose chairs that have good low-back (lumbar) support.  If you work at a desk, sit close to it so you do not need to lean over. Keep your chin tucked in. Keep your neck drawn back, and keep your elbows bent at a right angle. Your arms should look like the letter "L."  Sit high and close to the steering wheel when you drive. Add a lumbar support to your car seat, if needed.  Avoid sitting or standing in one position for very long. Take breaks to get up, stretch, and walk around at least one time every hour. Take breaks every hour if you are driving for long periods of time.  Sleep on your side with your knees slightly bent, or sleep on your back with a pillow under your knees. Do not lie on the front of your body to sleep. WHAT SHOULD I KNOW ABOUT LIFTING, TWISTING, AND REACHING? Lifting and Heavy Lifting  Avoid heavy lifting, especially repetitive heavy lifting. If you must do heavy lifting:  Stretch before lifting.  Work slowly.  Rest between lifts.  Use a tool such as a cart or a dolly to move objects if one is available.  Make several small trips instead of carrying one heavy load.  Ask for help when you need it, especially when moving big  objects.  Follow these steps when lifting:  Stand with your feet shoulder-width apart.  Get as close to the object as you can. Do not try to pick up a heavy object that is far from your body.  Use handles or lifting straps if they are available.  Bend at your knees. Squat down, but keep your heels off the floor.  Keep your shoulders pulled back, your chin tucked in, and your back straight.  Lift the object slowly while you tighten the muscles in your legs, abdomen, and buttocks. Keep the object as close to the center of your body as possible.  Follow these steps when putting down a heavy load:  Stand with your feet  shoulder-width apart.  Lower the object slowly while you tighten the muscles in your legs, abdomen, and buttocks. Keep the object as close to the center of your body as possible.  Keep your shoulders pulled back, your chin tucked in, and your back straight.  Bend at your knees. Squat down, but keep your heels off the floor.  Use handles or lifting straps if they are available. Twisting and Reaching  Avoid lifting heavy objects above your waist.  Do not twist at your waist while you are lifting or carrying a load. If you need to turn, move your feet.  Do not bend over without bending at your knees.  Avoid reaching over your head, across a table, or for an object on a high surface. WHAT ARE SOME OTHER TIPS?  Avoid wet floors and icy ground. Keep sidewalks clear of ice to prevent falls.  Do not sleep on a mattress that is too soft or too hard.  Keep items that are used frequently within easy reach.  Put heavier objects on shelves at waist level, and put lighter objects on lower or higher shelves.  Find ways to decrease your stress, such as exercise, massage, or relaxation techniques. Stress can build up in your muscles. Tense muscles are more vulnerable to injury.  Talk with your health care provider if you feel anxious or depressed. These conditions can make back pain worse.  Wear flat heel shoes with cushioned soles.  Avoid sudden movements.  Use both shoulder straps when carrying a backpack.  Do not use any tobacco products, including cigarettes, chewing tobacco, or electronic cigarettes. If you need help quitting, ask your health care provider.   This information is not intended to replace advice given to you by your health care provider. Make sure you discuss any questions you have with your health care provider.   Document Released: 05/14/2004 Document Revised: 08/21/2014 Document Reviewed: 04/10/2014 Elsevier Interactive Patient Education [employer].

## 2016-03-02 ENCOUNTER — Other Ambulatory Visit: Payer: Self-pay | Admitting: Internal Medicine

## 2016-04-16 DIAGNOSIS — H1033 Unspecified acute conjunctivitis, bilateral: Secondary | ICD-10-CM | POA: Diagnosis not present

## 2016-06-10 ENCOUNTER — Other Ambulatory Visit: Payer: Self-pay | Admitting: Internal Medicine

## 2016-07-08 ENCOUNTER — Encounter: Payer: Self-pay | Admitting: Internal Medicine

## 2016-07-08 ENCOUNTER — Ambulatory Visit (INDEPENDENT_AMBULATORY_CARE_PROVIDER_SITE_OTHER): Payer: PPO | Admitting: Internal Medicine

## 2016-07-08 VITALS — BP 148/100 | HR 66 | Temp 97.5°F | Ht 62.0 in | Wt 178.5 lb

## 2016-07-08 DIAGNOSIS — I10 Essential (primary) hypertension: Secondary | ICD-10-CM | POA: Diagnosis not present

## 2016-07-08 DIAGNOSIS — M25522 Pain in left elbow: Secondary | ICD-10-CM | POA: Diagnosis not present

## 2016-07-08 DIAGNOSIS — M25512 Pain in left shoulder: Secondary | ICD-10-CM | POA: Diagnosis not present

## 2016-07-08 DIAGNOSIS — Z0001 Encounter for general adult medical examination with abnormal findings: Secondary | ICD-10-CM

## 2016-07-08 MED ORDER — IBUPROFEN 600 MG PO TABS: 600.0000 mg | ORAL_TABLET | Freq: Four times a day (QID) | ORAL | 1 refills | Status: DC | PRN

## 2016-07-08 MED ORDER — PREDNISONE 10 MG PO TABS: ORAL_TABLET | ORAL | 0 refills | Status: DC

## 2016-07-08 MED ORDER — TRIAMCINOLONE ACETONIDE 0.1 % EX CREA: 1.0000 "application " | TOPICAL_CREAM | Freq: Two times a day (BID) | CUTANEOUS | 0 refills | Status: DC

## 2016-07-08 NOTE — Assessment & Plan Note (Signed)
Mild uncontrolled,   BP Readings from Last 3 Encounters:  07/08/16 (!) 148/100  01/30/16 140/80  08/14/15 136/80   Likely reactive, o/w stable overall by history and exam, recent data reviewed with pt, and pt to continue medical treatment as before,  to f/u any worsening symptoms or concerns

## 2016-07-08 NOTE — Assessment & Plan Note (Signed)
Etiology unclear, but suggestive of ulnar neuritis, will also refer to sports medicine for this as well, consider NCS/EMG

## 2016-07-08 NOTE — Assessment & Plan Note (Signed)
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

## 2016-07-08 NOTE — Patient Instructions (Addendum)
Please take all new medication as prescribed - the prednisone, and the ibuprofen  You will be contacted regarding the referral for: Dr Katrinka BlazingSmith asap  Please continue all other medications as before, and refills have been done if requested.  Please have the pharmacy call with any other refills you may need.  Please keep your appointments with your specialists as you may have planned  Please return in 3 months, or sooner if needed, with Lab testing done 3-5 days before

## 2016-07-08 NOTE — Progress Notes (Signed)
Pre visit review using our clinic review tool, if applicable. No additional management support is needed unless otherwise documented below in the visit note. 

## 2016-07-08 NOTE — Progress Notes (Signed)
Subjective:    Patient ID: Deborah Schultz, female    DOB: May 09, 1945, 71 y.o.   MRN: [id number]  HPI  Here with acute c/o left shoulder and elbow pain, both ongoing for at least 3 wks; intermittent, mild to mod but occas severe, worse to abduct the shoulder but also tender to palpate the ulnar groove with pain seeming to radiate toward the shoulder, but also more distal as well on occasion, maybe even with mild distal LUE weakness. Pain overall worse to LUE with reaching forward or behind the back, or trying to sleep on the left side.   Denies neck pain.  Pt denies chest pain, increased sob or doe, wheezing, orthopnea, PND, increased LE swelling, palpitations, dizziness or syncope.  No fever or trauma No prior hx of similar Past Medical History:  Diagnosis Date  . ALOPECIA 06/06/2008  . Anemia    iron deficiency  . ANEMIA-IRON DEFICIENCY 06/06/2008  . BICEPS TENDINITIS, LEFT 05/06/2010  . BRONCHITIS, ACUTE 06/18/2008  . CALLUSES, FEET, BILATERAL 06/06/2008  . Chronic pruritus   . CONSTIPATION 06/06/2008  . Cough 06/18/2008  . Degenerative disc disease, cervical   . Diverticulitis   . DIVERTICULITIS, HX OF 06/06/2008  . ELBOW PAIN 05/26/2010  . FATIGUE 06/18/2008  . HLD (hyperlipidemia)   . HTN (hypertension)   . HYPERLIPIDEMIA 06/06/2008  . OAB (overactive bladder)   . OVERACTIVE BLADDER 06/06/2008  . PERIPHERAL EDEMA 02/19/2009  . Peripheral edema 02/26/2011  . Right carpal tunnel syndrome   . SINUSITIS- ACUTE-NOS 01/08/2009   Past Surgical History:  Procedure Laterality Date  . ABDOMINAL HYSTERECTOMY    . APPENDECTOMY    . BREAST BIOPSY    . BUNIONECTOMY    . CATARACT EXTRACTION     bilateral 11/2009-no longer wears glasses  . ECTOPIC PREGNANCY SURGERY    . OOPHORECTOMY    . right thumb surgery      reports that she has never smoked. She has never used smokeless tobacco. She reports that she drinks alcohol. She reports that she does not use drugs. family history includes Cancer in her  maternal aunt and maternal uncle; Diabetes in her brother; Hypertension in her mother; Ovarian cancer in her mother. Allergies  Allergen Reactions  . Nystatin Rash  . Esomeprazole Magnesium     REACTION: Headache  . Fexofenadine     REACTION: Hives  . Sulfonamide Derivatives     Per pt: unknown reaction   Current Outpatient Prescriptions on File Prior to Visit  Medication Sig Dispense Refill  . amLODipine (NORVASC) 5 MG tablet TAKE ONE TABLET BY MOUTH ONCE DAILY 90 tablet 2  . aspirin 81 MG tablet Take 81 mg by mouth daily.      Marland Kitchen. estradiol (ESTRACE) 1 MG tablet Take 1 tablet (1 mg total) by mouth daily. 90 tablet 3  . furosemide (LASIX) 40 MG tablet TAKE ONE TABLET BY MOUTH TWICE DAILY WITH THE SECOND DOSE ONLY TAKE AS NEEDED FOR PERSISTANT SWELLING 180 tablet 3  . Glucosamine-Chondroitin (MOVE FREE PO) Take 1 capsule by mouth daily.    . irbesartan (AVAPRO) 300 MG tablet TAKE ONE TABLET BY MOUTH AT BEDTIME 90 tablet 0  . Multiple Vitamin (MULTIVITAMIN) tablet Take 1 tablet by mouth daily.      Marland Kitchen. oxybutynin (DITROPAN) 5 MG tablet TAKE ONE TABLET BY MOUTH TWICE DAILY 180 tablet 1  . pravastatin (PRAVACHOL) 40 MG tablet Take 1 tablet (40 mg total) by mouth daily. 90 tablet 3  .  propranolol ER (INDERAL LA) 80 MG 24 hr capsule Take 1 capsule (80 mg total) by mouth daily. 90 capsule 0  . VITAMIN D, CHOLECALCIFEROL, PO Take by mouth daily.       No current facility-administered medications on file prior to visit.    Review of Systems  Constitutional: Negative for unusual diaphoresis or night sweats HENT: Negative for ear swelling or discharge Eyes: Negative for worsening visual haziness  Respiratory: Negative for choking and stridor.   Gastrointestinal: Negative for distension or worsening eructation Genitourinary: Negative for retention or change in urine volume.  Musculoskeletal: Negative for other MSK pain or swelling Skin: Negative for color change and worsening wound Neurological:  Negative for tremors and numbness other than noted  Psychiatric/Behavioral: Negative for decreased concentration or agitation other than above   All other system neg per pt    Objective:   Physical Exam BP (!) 148/100 (BP Location: Left Arm, Patient Position: Sitting, Cuff Size: Normal)   Pulse 66   Temp 97.5 F (36.4 C) (Oral)   Ht 5\' 2"  (1.575 m)   Wt 178 lb 8 oz (81 kg)   SpO2 98%   BMI 32.65 kg/m  VS noted,  Constitutional: Pt appears in no apparent distress HENT: Head: NCAT.  Right Ear: External ear normal.  Left Ear: External ear normal.  Eyes: . Pupils are equal, round, and reactive to light. Conjunctivae and EOM are normal Neck: Normal range of motion. Neck supple.  Cardiovascular: Normal rate and regular rhythm.   Pulmonary/Chest: Effort normal and breath sounds without rales or wheezing.  Left upper extremity with tenderness to the post deltoid near the humerous insertion site, also tender over left ulnar groove Neurological: Pt is alert. Not confused , motor 5/5 intact, sens /dtr intact Skin: Skin is warm. No rash, no LE edema Psychiatric: Pt behavior is normal. No agitation.  No other exam findings    Assessment & Plan:

## 2016-07-27 NOTE — Progress Notes (Signed)
[facility] [address] [place], [place] [address] Phone: [phone] Subjective:    I'm seeing this patient by the request  of:  Oliver Barre, MD   CC: Left shoulder and elbow pain  BJY:[id number]  Deborah Schultz is a 71 y.o. female coming in with complaint of left shoulder and elbow pain. Been going on for greater than one month. States that the pain still seems to be intermittent but becoming more constant. Describes pain as moderate but can be severe with certain movements. Worse with lifting the shoulder can radiate all the way down to the inner portion of the elbow. Denies any numbness. States that maybe some mild weakness. Denies any associated neck pain. Rates the severity of pain a 7 out of 10.     Past Medical History:  Diagnosis Date  . ALOPECIA 06/06/2008  . Anemia    iron deficiency  . ANEMIA-IRON DEFICIENCY 06/06/2008  . BICEPS TENDINITIS, LEFT 05/06/2010  . BRONCHITIS, ACUTE 06/18/2008  . CALLUSES, FEET, BILATERAL 06/06/2008  . Chronic pruritus   . CONSTIPATION 06/06/2008  . Cough 06/18/2008  . Degenerative disc disease, cervical   . Diverticulitis   . DIVERTICULITIS, HX OF 06/06/2008  . ELBOW PAIN 05/26/2010  . FATIGUE 06/18/2008  . HLD (hyperlipidemia)   . HTN (hypertension)   . HYPERLIPIDEMIA 06/06/2008  . OAB (overactive bladder)   . OVERACTIVE BLADDER 06/06/2008  . PERIPHERAL EDEMA 02/19/2009  . Peripheral edema 02/26/2011  . Right carpal tunnel syndrome   . SINUSITIS- ACUTE-NOS 01/08/2009   Past Surgical History:  Procedure Laterality Date  . ABDOMINAL HYSTERECTOMY    . APPENDECTOMY    . BREAST BIOPSY    . BUNIONECTOMY    . CATARACT EXTRACTION     bilateral 11/2009-no longer wears glasses  . ECTOPIC PREGNANCY SURGERY    . OOPHORECTOMY    . right thumb surgery     Social History   Social History  . Marital status: Married    Spouse name: N/A  . Number of children: N/A  . Years of education: N/A   Social History Main Topics   . Smoking status: Never Smoker  . Smokeless tobacco: Never Used  . Alcohol use 0.0 oz/week     Comment: rare  . Drug use: No  . Sexual activity: Not on file   Other Topics Concern  . 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    Allergies  Allergen Reactions  . Nystatin Rash  . Esomeprazole Magnesium     REACTION: Headache  . Fexofenadine     REACTION: Hives  . Sulfonamide Derivatives     Per pt: unknown reaction   Family History  Problem Relation Age of Onset  . Hypertension Mother   . Ovarian cancer Mother   . Diabetes Brother   . Cancer Maternal Aunt     unspecif if mat or pat  . Cancer Maternal Uncle     unspecif if mat or pat  . Colon cancer Neg Hx     Past medical history, social, surgical and family history all reviewed in electronic medical record.  No pertanent information unless stated regarding to the chief complaint.   Review of Systems:Review of systems updated and as accurate as of 07/27/16  No headache, visual changes, nausea, vomiting, diarrhea, constipation, dizziness, abdominal pain, skin rash, fevers, chills, night sweats, weight loss, swollen lymph nodes chest pain, shortness of  breath, mood changes.  Positive muscle aches and body aches  Objective  There were no vitals taken for this visit. Systems examined below as of 07/27/16   General: No apparent distress alert and oriented x3 mood and affect normal, dressed appropriately. Significant hair loss HEENT: Pupils equal, extraocular movements intact  Respiratory: Patient's speak in full sentences and does not appear short of breath  Cardiovascular: No lower extremity edema, non tender, no erythema  Skin: Warm dry intact with no signs of infection or rash on extremities or on axial skeleton.  Abdomen: Soft nontender  Neuro: Cranial nerves II through XII are intact, neurovascularly intact in all extremities with 2+ DTRs and 2+  pulses.  Lymph: No lymphadenopathy of posterior or anterior cervical chain or axillae bilaterally.  Gait normal with good balance and coordination.  MSK:  Non tender with full range of motion and good stability and symmetric strength and tone of  elbows, wrist, hip, knee and ankles bilaterally.  Neck: Inspection unremarkable. No palpable stepoffs. Negative Spurling's maneuver. Mild decreased range of motion lacking the last 10 of extension and last 10 of left-sided side bending and rotation. Grip strength and sensation normal in bilateral hands Strength good C4 to T1 distribution No sensory change to C4 to T1 Negative Hoffman sign bilaterally Reflexes normal  Shoulder: left Inspection reveals no abnormalities, atrophy or asymmetry. Palpation is normal with no tenderness over AC joint or bicipital groove. ROM is full in all planes passively. Rotator cuff strength normal throughout. signs of impingement with positive Neer and Hawkin's tests, but negative empty can sign. Speeds and Yergason's tests normal. No labral pathology noted with negative Obrien's, negative clunk and good stability. Normal scapular function observed. No painful arc and no drop arm sign. No apprehension sign  MSK US performed of: left This study was ordered, performed, and interpreted by Terrilee Files D.O.  Shoulder:   Supraspinatus:  Appears normal on long and transverse views, Bursal bulge seen with shoulder abduction on impingement view. Thickening of the capsule noted Infraspinatus:  Appears normal on long and transverse views. Significant increase in Doppler flow Subscapularis:  Appears normal on long and transverse views. Positive bursa thickening of the anterior capsule noted Teres Minor:  Appears normal on long and transverse views. AC joint:  Capsule undistended, no geyser sign. Glenohumeral Joint:  Appears normal without effusion. Glenoid Labrum:  Intact without visualized tears. Biceps Tendon:   Appears normal on long and transverse views, no fraying of tendon, tendon located in intertubercular groove, no subluxation with shoulder internal or external rotation.  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  This included exercises focusing on stretching, strengthening, with significant focus on eccentric aspects.   Shoulder Exercises that included:  Basic scapular stabilization to include adduction and depression of scapula Scaption, focusing on proper movement and good control Internal and External rotation utilizing a theraband, with elbow tucked at side entire time Rows with theraband  Proper technique shown and discussed handout in great detail with ATC.  All questions were discussed and answered.        Impression and Recommendations:     This case required medical decision making of moderate complexity.      Note: This dictation was prepared with Dragon dictation along with smaller phrase technology. Any transcriptional errors that result from this process are unintentional.

## 2016-07-28 ENCOUNTER — Ambulatory Visit: Payer: Self-pay

## 2016-07-28 ENCOUNTER — Ambulatory Visit (INDEPENDENT_AMBULATORY_CARE_PROVIDER_SITE_OTHER): Payer: PPO | Admitting: Family Medicine

## 2016-07-28 ENCOUNTER — Ambulatory Visit (INDEPENDENT_AMBULATORY_CARE_PROVIDER_SITE_OTHER)
Admission: RE | Admit: 2016-07-28 | Discharge: 2016-07-28 | Disposition: A | Discharge status: Home / Self Care | Payer: PPO | Source: Ambulatory Visit | Attending: Family Medicine | Admitting: Family Medicine

## 2016-07-28 ENCOUNTER — Encounter: Payer: Self-pay | Admitting: Family Medicine

## 2016-07-28 VITALS — BP 148/100 | HR 62 | Resp 16 | Ht 61.5 in | Wt 180.0 lb

## 2016-07-28 DIAGNOSIS — M25512 Pain in left shoulder: Secondary | ICD-10-CM | POA: Diagnosis not present

## 2016-07-28 DIAGNOSIS — M7502 Adhesive capsulitis of left shoulder: Secondary | ICD-10-CM | POA: Diagnosis not present

## 2016-07-28 DIAGNOSIS — M19012 Primary osteoarthritis, left shoulder: Secondary | ICD-10-CM | POA: Diagnosis not present

## 2016-07-28 DIAGNOSIS — M47812 Spondylosis without myelopathy or radiculopathy, cervical region: Secondary | ICD-10-CM | POA: Diagnosis not present

## 2016-07-28 DIAGNOSIS — M7500 Adhesive capsulitis of unspecified shoulder: Secondary | ICD-10-CM | POA: Insufficient documentation

## 2016-07-28 MED ORDER — GABAPENTIN 100 MG PO CAPS: 200.0000 mg | ORAL_CAPSULE | Freq: Every day | ORAL | 3 refills | Status: DC

## 2016-07-28 MED ORDER — VITAMIN D (ERGOCALCIFEROL) 1.25 MG (50000 UNIT) PO CAPS: 50000.0000 [IU] | ORAL_CAPSULE | ORAL | 0 refills | Status: DC

## 2016-07-28 NOTE — Assessment & Plan Note (Signed)
Patient does have more of a frozen shoulder.. We discussed icing regimen, home exercises, which activities in which ones to avoid. Encourage patient to increase activity as tolerated. Work with Event organiser in greater detail. Patient given gabapentin in case the differential includes a cervical radiculopathy. Patient will have x-rays today. Follow-up again with me in 4-6 weeks.

## 2016-07-28 NOTE — Patient Instructions (Addendum)
Good to see you.  Ice 20 minutes 2 times daily. Usually after activity and before bed. pennsaid pinkie amount topically 2 times daily as needed.  Once weekly vitamin D for 12 weeks.  Gabapentin  at night Stay active.  Keep hands within peripheral vision with any lifting.  See me again in 4 weeks.

## 2016-07-28 NOTE — Progress Notes (Signed)
Pre-visit discussion using our clinic review tool. No additional management support is needed unless otherwise documented below in the visit note.  

## 2016-08-19 ENCOUNTER — Other Ambulatory Visit: Payer: Self-pay | Admitting: Internal Medicine

## 2016-08-19 DIAGNOSIS — N6320 Unspecified lump in the left breast, unspecified quadrant: Secondary | ICD-10-CM

## 2016-08-19 DIAGNOSIS — R921 Mammographic calcification found on diagnostic imaging of breast: Secondary | ICD-10-CM

## 2016-08-24 NOTE — Progress Notes (Signed)
[facility] [address] [place], [place] [address] Phone: [phone] Subjective:    I'm seeing this patient by the request  of:  Corwin Levins, MD   CC: Left shoulder and elbow pain f/u  BJY:NWGNFAOZHY  Deborah Schultz is a 71 y.o. female coming in with complaint of left shoulder and elbow pain. Been going on for greater than one month. Was found to have mild arthritic changes of the shoulder as well as the neck. Patient was doing fairly well with conservative therapy. Patient states that she continues to do relatively well. Patient still states that certain range of motion gives him difficulty.     Past Medical History:  Diagnosis Date  . ALOPECIA 06/06/2008  . Anemia    iron deficiency  . ANEMIA-IRON DEFICIENCY 06/06/2008  . BICEPS TENDINITIS, LEFT 05/06/2010  . BRONCHITIS, ACUTE 06/18/2008  . CALLUSES, FEET, BILATERAL 06/06/2008  . Chronic pruritus   . CONSTIPATION 06/06/2008  . Cough 06/18/2008  . Degenerative disc disease, cervical   . Diverticulitis   . DIVERTICULITIS, HX OF 06/06/2008  . ELBOW PAIN 05/26/2010  . FATIGUE 06/18/2008  . HLD (hyperlipidemia)   . HTN (hypertension)   . HYPERLIPIDEMIA 06/06/2008  . OAB (overactive bladder)   . OVERACTIVE BLADDER 06/06/2008  . PERIPHERAL EDEMA 02/19/2009  . Peripheral edema 02/26/2011  . Right carpal tunnel syndrome   . SINUSITIS- ACUTE-NOS 01/08/2009   Past Surgical History:  Procedure Laterality Date  . ABDOMINAL HYSTERECTOMY    . APPENDECTOMY    . BREAST BIOPSY    . BUNIONECTOMY    . CATARACT EXTRACTION     bilateral 11/2009-no longer wears glasses  . ECTOPIC PREGNANCY SURGERY    . OOPHORECTOMY    . right thumb surgery     Social History   Social History  . Marital status: Married    Spouse name: N/A  . Number of children: N/A  . Years of education: N/A   Social History Main Topics  . Smoking status: Never Smoker  . Smokeless tobacco: Never Used  . Alcohol use 0.0 oz/week   Comment: rare  . Drug use: No  . Sexual activity: Not on file   Other Topics Concern  . 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    Allergies  Allergen Reactions  . Nystatin Rash  . Esomeprazole Magnesium     REACTION: Headache  . Fexofenadine     REACTION: Hives  . Sulfonamide Derivatives     Per pt: unknown reaction   Family History  Problem Relation Age of Onset  . Hypertension Mother   . Ovarian cancer Mother   . Diabetes Brother   . Cancer Maternal Aunt     unspecif if mat or pat  . Cancer Maternal Uncle     unspecif if mat or pat  . Colon cancer Neg Hx     Past medical history, social, surgical and family history all reviewed in electronic medical record.  No pertanent information unless stated regarding to the chief complaint.   Review of Systems: No headache, visual changes, nausea, vomiting, diarrhea, constipation, dizziness, abdominal pain, skin rash, fevers, chills, night sweats, weight loss, swollen lymph nodes, body aches, joint swelling, muscle aches, chest pain, shortness of breath, mood changes.    Objective  Blood pressure 128/80, pulse 73, resp. rate 16, weight 178 lb (80.7 kg), SpO2 98 %.   Systems examined  below as of 08/25/16 General: NAD A&O x3 mood, affect normal  HEENT: Pupils equal, extraocular movements intact no nystagmus Respiratory: not short of breath at rest or with speaking Cardiovascular: No lower extremity edema, non tender Skin: Warm dry intact with no signs of infection or rash on extremities or on axial skeleton. Abdomen: Soft nontender, no masses Neuro: Cranial nerves  intact, neurovascularly intact in all extremities with 2+ DTRs and 2+ pulses. Lymph: No lymphadenopathy appreciated today  Gait normal with good balance and coordination.  MSK: Non tender with full range of motion and good stability and symmetric strength and tone of shoulders, elbows, wrist,   knee hips and ankles bilaterally.  Mild arthritic changes of multiple joints Neck: Inspection unremarkable. No palpable stepoffs. Negative Spurling's maneuver. Minimal Limited range of motion and rotation Grip strength and sensation normal in bilateral hands Strength good C4 to T1 distribution No sensory change to C4 to T1 Negative Hoffman sign bilaterally Reflexes normal  Shoulder: Left Inspection reveals no abnormalities, atrophy or asymmetry. Palpation is normal with no tenderness over AC joint or bicipital groove. ROM is full in all planes. Rotator cuff strength normal throughout. Mild impingement Speeds and Yergason's tests normal. No labral pathology noted with negative Obrien's, negative clunk and good stability. Normal scapular function observed. No painful arc and no drop arm sign. No apprehension sign    Impression and Recommendations:     This case required medical decision making of moderate complexity.      Note: This dictation was prepared with Dragon dictation along with smaller phrase technology. Any transcriptional errors that result from this process are unintentional.

## 2016-08-25 ENCOUNTER — Ambulatory Visit (INDEPENDENT_AMBULATORY_CARE_PROVIDER_SITE_OTHER): Payer: PPO | Admitting: Family Medicine

## 2016-08-25 ENCOUNTER — Encounter: Payer: Self-pay | Admitting: Family Medicine

## 2016-08-25 DIAGNOSIS — M7502 Adhesive capsulitis of left shoulder: Secondary | ICD-10-CM | POA: Diagnosis not present

## 2016-08-25 NOTE — Patient Instructions (Signed)
Good to see yo u Ice is your friend Take a break from my exercises for 2 days then start again 3 times a week.  Keep hands within peripheral vision.  Continue the same medicines Iron 65mg  with 500mg  of vitamin C  See me again in 4 weeks and if not better we will look at neck.

## 2016-08-25 NOTE — Assessment & Plan Note (Signed)
Patient seems to be doing relatively well at this point. No change in management. We discussed icing regimen and home exercises. Patient will follow-up with me on an as-needed basis.

## 2016-08-28 ENCOUNTER — Other Ambulatory Visit: Payer: Self-pay | Admitting: Internal Medicine

## 2016-09-10 ENCOUNTER — Other Ambulatory Visit: Payer: Self-pay | Admitting: Internal Medicine

## 2016-09-27 ENCOUNTER — Encounter: Payer: Self-pay | Admitting: Internal Medicine

## 2016-09-28 ENCOUNTER — Ambulatory Visit: Payer: PPO | Admitting: Family Medicine

## 2016-09-30 ENCOUNTER — Ambulatory Visit
Admission: RE | Admit: 2016-09-30 | Discharge: 2016-09-30 | Disposition: A | Discharge status: Home / Self Care | Payer: PPO | Source: Ambulatory Visit | Attending: Internal Medicine | Admitting: Internal Medicine

## 2016-09-30 DIAGNOSIS — N6320 Unspecified lump in the left breast, unspecified quadrant: Secondary | ICD-10-CM

## 2016-09-30 DIAGNOSIS — N6002 Solitary cyst of left breast: Secondary | ICD-10-CM | POA: Diagnosis not present

## 2016-09-30 DIAGNOSIS — R921 Mammographic calcification found on diagnostic imaging of breast: Secondary | ICD-10-CM

## 2016-09-30 DIAGNOSIS — R928 Other abnormal and inconclusive findings on diagnostic imaging of breast: Secondary | ICD-10-CM | POA: Diagnosis not present

## 2016-10-20 ENCOUNTER — Other Ambulatory Visit (INDEPENDENT_AMBULATORY_CARE_PROVIDER_SITE_OTHER): Payer: PPO

## 2016-10-20 DIAGNOSIS — Z1159 Encounter for screening for other viral diseases: Secondary | ICD-10-CM | POA: Diagnosis not present

## 2016-10-20 DIAGNOSIS — Z0001 Encounter for general adult medical examination with abnormal findings: Secondary | ICD-10-CM | POA: Diagnosis not present

## 2016-10-20 LAB — HEPATIC FUNCTION PANEL
ALBUMIN: 4.1 g/dL (ref 3.5–5.2)
ALT: 22 U/L (ref 0–35)
AST: 20 U/L (ref 0–37)
Alkaline Phosphatase: 60 U/L (ref 39–117)
BILIRUBIN TOTAL: 0.6 mg/dL (ref 0.2–1.2)
Bilirubin, Direct: 0.1 mg/dL (ref 0.0–0.3)
Total Protein: 7.2 g/dL (ref 6.0–8.3)

## 2016-10-20 LAB — CBC WITH DIFFERENTIAL/PLATELET
BASOS PCT: 1 % (ref 0.0–3.0)
Basophils Absolute: 0.1 10*3/uL (ref 0.0–0.1)
EOS PCT: 2.7 % (ref 0.0–5.0)
Eosinophils Absolute: 0.2 10*3/uL (ref 0.0–0.7)
HEMATOCRIT: 39.3 % (ref 36.0–46.0)
HEMOGLOBIN: 13.1 g/dL (ref 12.0–15.0)
LYMPHS PCT: 41.4 % (ref 12.0–46.0)
Lymphs Abs: 3.2 10*3/uL (ref 0.7–4.0)
MCHC: 33.3 g/dL (ref 30.0–36.0)
MCV: 90.1 fl (ref 78.0–100.0)
MONO ABS: 0.6 10*3/uL (ref 0.1–1.0)
MONOS PCT: 8 % (ref 3.0–12.0)
Neutro Abs: 3.6 10*3/uL (ref 1.4–7.7)
Neutrophils Relative %: 46.9 % (ref 43.0–77.0)
Platelets: 258 10*3/uL (ref 150.0–400.0)
RBC: 4.36 Mil/uL (ref 3.87–5.11)
RDW: 13.6 % (ref 11.5–15.5)
WBC: 7.7 10*3/uL (ref 4.0–10.5)

## 2016-10-20 LAB — BASIC METABOLIC PANEL
BUN: 13 mg/dL (ref 6–23)
CALCIUM: 9.4 mg/dL (ref 8.4–10.5)
CO2: 29 mEq/L (ref 19–32)
Chloride: 102 mEq/L (ref 96–112)
Creatinine, Ser: 0.82 mg/dL (ref 0.40–1.20)
GFR: 88.8 mL/min (ref >60.00)
Glucose, Bld: 91 mg/dL (ref 70–99)
POTASSIUM: 3.7 meq/L (ref 3.5–5.1)
Sodium: 138 mEq/L (ref 135–145)

## 2016-10-20 LAB — LIPID PANEL
CHOL/HDL RATIO: 3
Cholesterol: 170 mg/dL (ref 0–200)
HDL: 63.3 mg/dL (ref >39.00)
LDL CALC: 86 mg/dL (ref 0–99)
NonHDL: 106.47
TRIGLYCERIDES: 103 mg/dL (ref 0.0–149.0)
VLDL: 20.6 mg/dL (ref 0.0–40.0)

## 2016-10-20 LAB — URINALYSIS, ROUTINE W REFLEX MICROSCOPIC
BILIRUBIN URINE: NEGATIVE
Ketones, ur: NEGATIVE
LEUKOCYTES UA: NEGATIVE
NITRITE: NEGATIVE
RBC / HPF: NONE SEEN (ref 0–?)
Specific Gravity, Urine: <=1.005 — AB (ref 1.000–1.030)
TOTAL PROTEIN, URINE-UPE24: NEGATIVE
Urine Glucose: NEGATIVE
Urobilinogen, UA: 0.2 (ref 0.0–1.0)
WBC UA: NONE SEEN (ref 0–?)
pH: 6.5 (ref 5.0–8.0)

## 2016-10-20 LAB — TSH: TSH: 2.32 u[IU]/mL (ref 0.35–4.50)

## 2016-10-20 LAB — HEPATITIS C ANTIBODY: HCV Ab: NEGATIVE

## 2016-10-22 ENCOUNTER — Ambulatory Visit (INDEPENDENT_AMBULATORY_CARE_PROVIDER_SITE_OTHER): Payer: PPO | Admitting: Internal Medicine

## 2016-10-22 ENCOUNTER — Encounter: Payer: Self-pay | Admitting: Internal Medicine

## 2016-10-22 VITALS — BP 144/86 | HR 69 | Ht 61.5 in | Wt 179.0 lb

## 2016-10-22 DIAGNOSIS — Z0000 Encounter for general adult medical examination without abnormal findings: Secondary | ICD-10-CM

## 2016-10-22 DIAGNOSIS — Z23 Encounter for immunization: Secondary | ICD-10-CM | POA: Diagnosis not present

## 2016-10-22 NOTE — Progress Notes (Signed)
Subjective:    Patient ID: Deborah Schultz, female    DOB: 04-01-1946, 71 y.o.   MRN: [id number]  HPI  Here for wellness and f/u;  Overall doing ok;  Pt denies Chest pain, worsening SOB, DOE, wheezing, orthopnea, PND, worsening LE edema, palpitations, dizziness or syncope.  Pt denies neurological change such as new headache, facial or extremity weakness.  Pt denies polydipsia, polyuria, or low sugar symptoms. Pt states overall good compliance with treatment and medications, good tolerability, and has been trying to follow appropriate diet.  Pt denies worsening depressive symptoms, suicidal ideation or panic. No fever, night sweats, wt loss, loss of appetite, or other constitutional symptoms.  Pt states good ability with ADL's, has low fall risk, home safety reviewed and adequate, no other significant changes in hearing or vision, and only occasionally active with exercise. No new complaints today Past Medical History:  Diagnosis Date  . ALOPECIA 06/06/2008  . Anemia    iron deficiency  . ANEMIA-IRON DEFICIENCY 06/06/2008  . BICEPS TENDINITIS, LEFT 05/06/2010  . BRONCHITIS, ACUTE 06/18/2008  . CALLUSES, FEET, BILATERAL 06/06/2008  . Chronic pruritus   . CONSTIPATION 06/06/2008  . Cough 06/18/2008  . Degenerative disc disease, cervical   . Diverticulitis   . DIVERTICULITIS, HX OF 06/06/2008  . ELBOW PAIN 05/26/2010  . FATIGUE 06/18/2008  . HLD (hyperlipidemia)   . HTN (hypertension)   . HYPERLIPIDEMIA 06/06/2008  . OAB (overactive bladder)   . OVERACTIVE BLADDER 06/06/2008  . PERIPHERAL EDEMA 02/19/2009  . Peripheral edema 02/26/2011  . Right carpal tunnel syndrome   . SINUSITIS- ACUTE-NOS 01/08/2009   Past Surgical History:  Procedure Laterality Date  . ABDOMINAL HYSTERECTOMY    . APPENDECTOMY    . BREAST BIOPSY    . BUNIONECTOMY    . CATARACT EXTRACTION     bilateral 11/2009-no longer wears glasses  . ECTOPIC PREGNANCY SURGERY    . OOPHORECTOMY    . right thumb surgery      reports that  she has never smoked. She has never used smokeless tobacco. She reports that she drinks alcohol. She reports that she does not use drugs. family history includes Cancer in her maternal aunt and maternal uncle; Diabetes in her brother; Hypertension in her mother; Ovarian cancer in her mother. Allergies  Allergen Reactions  . Nystatin Rash  . Esomeprazole Magnesium     REACTION: Headache  . Fexofenadine     REACTION: Hives  . Sulfonamide Derivatives     Per pt: unknown reaction   Current Outpatient Prescriptions on File Prior to Visit  Medication Sig Dispense Refill  . amLODipine (NORVASC) 5 MG tablet TAKE ONE TABLET BY MOUTH ONCE DAILY 90 tablet 2  . aspirin 81 MG tablet Take 81 mg by mouth daily.      Marland Kitchen estradiol (ESTRACE) 1 MG tablet Take 1 tablet (1 mg total) by mouth daily. 90 tablet 3  . furosemide (LASIX) 40 MG tablet TAKE ONE TABLET BY MOUTH TWICE DAILY WITH THE SECOND DOSE ONLY TAKE AS NEEDED FOR PERSISTANT SWELLING 180 tablet 3  . gabapentin (NEURONTIN) 100 MG capsule Take 2 capsules (200 mg total) by mouth at bedtime. 60 capsule 3  . Glucosamine-Chondroitin (MOVE FREE PO) Take 1 capsule by mouth daily.    Marland Kitchen ibuprofen (ADVIL,MOTRIN) 600 MG tablet Take 1 tablet (600 mg total) by mouth every 6 (six) hours as needed. 40 tablet 1  . irbesartan (AVAPRO) 300 MG tablet TAKE 1 TABLET BY MOUTH AT BEDTIME 90  tablet 2  . Multiple Vitamin (MULTIVITAMIN) tablet Take 1 tablet by mouth daily.      Marland Kitchen. oxybutynin (DITROPAN) 5 MG tablet TAKE ONE TABLET BY MOUTH TWICE DAILY 180 tablet 1  . pravastatin (PRAVACHOL) 40 MG tablet TAKE ONE TABLET BY MOUTH ONCE DAILY 90 tablet 2  . propranolol ER (INDERAL LA) 80 MG 24 hr capsule Take 1 capsule (80 mg total) by mouth daily. 90 capsule 0  . triamcinolone cream (KENALOG) 0.1 % Apply 1 application topically 2 (two) times daily. 30 g 0  . VITAMIN D, CHOLECALCIFEROL, PO Take by mouth daily.      . Vitamin D, Ergocalciferol, (DRISDOL) 50000 units CAPS capsule  Take 1 capsule (50,000 Units total) by mouth every 7 (seven) days. 12 capsule 0   No current facility-administered medications on file prior to visit.    Review of Systems Constitutional: Negative for other unusual diaphoresis, sweats, appetite or weight changes HENT: Negative for other worsening hearing loss, ear pain, facial swelling, mouth sores or neck stiffness.   Eyes: Negative for other worsening pain, redness or other visual disturbance.  Respiratory: Negative for other stridor or swelling Cardiovascular: Negative for other palpitations or other chest pain  Gastrointestinal: Negative for worsening diarrhea or loose stools, blood in stool, distention or other pain Genitourinary: Negative for hematuria, flank pain or other change in urine volume.  Musculoskeletal: Negative for myalgias or other joint swelling.  Skin: Negative for other color change, or other wound or worsening drainage.  Neurological: Negative for other syncope or numbness. Hematological: Negative for other adenopathy or swelling Psychiatric/Behavioral: Negative for hallucinations, other worsening agitation, SI, self-injury, or new decreased concentration All other system neg per pt    Objective:   Physical Exam BP (!) 144/86   Pulse 69   Ht 5' 1.5" (1.562 m)   Wt 179 lb (81.2 kg)   SpO2 98%   BMI 33.27 kg/m  VS noted, not ill appearing, obese Constitutional: Pt is oriented to person, place, and time. Appears well-developed and well-nourished, in no significant distress and comfortable Head: Normocephalic and atraumatic  Eyes: Conjunctivae and EOM are normal. Pupils are equal, round, and reactive to light Right Ear: External ear normal without discharge Left Ear: External ear normal without discharge Nose: Nose without discharge or deformity Mouth/Throat: Oropharynx is without other ulcerations and moist  Neck: Normal range of motion. Neck supple. No JVD present. No tracheal deviation present or significant  neck LA or mass Cardiovascular: Normal rate, regular rhythm, normal heart sounds and intact distal pulses.   Pulmonary/Chest: WOB normal and breath sounds without rales or wheezing  Abdominal: Soft. Bowel sounds are normal. NT. No HSM  Musculoskeletal: Normal range of motion. Exhibits no edema Lymphadenopathy: Has no other cervical adenopathy.  Neurological: Pt is alert and oriented to person, place, and time. Pt has normal reflexes. No cranial nerve deficit. Motor grossly intact, Gait intact Skin: Skin is warm and dry. No rash noted or new ulcerations Psychiatric:  Has normal mood and affect. Behavior is normal without agitation No other exam findings Lab Results  Component Value Date   WBC 7.7 10/20/2016   HGB 13.1 10/20/2016   HCT 39.3 10/20/2016   PLT 258.0 10/20/2016   GLUCOSE 91 10/20/2016   CHOL 170 10/20/2016   TRIG 103.0 10/20/2016   HDL 63.30 10/20/2016   LDLCALC 86 10/20/2016   ALT 22 10/20/2016   AST 20 10/20/2016   NA 138 10/20/2016   K 3.7 10/20/2016   CL  102 10/20/2016   CREATININE 0.82 10/20/2016   BUN 13 10/20/2016   CO2 29 10/20/2016   TSH 2.32 10/20/2016       Assessment & Plan:

## 2016-10-22 NOTE — Patient Instructions (Signed)
You had the Prevnar 13 pneumonia shot  Please schedule the bone density test before leaving today at the scheduling desk (where you check out)  Please continue all other medications as before, and refills have been done if requested.  Please have the pharmacy call with any other refills you may need.  Please continue your efforts at being more active, low cholesterol diet, and weight control.  You are otherwise up to date with prevention measures today.  Please keep your appointments with your specialists as you may have planned  Please return in 1 year for your yearly visit, or sooner if needed, with Lab testing done 3-5 days before

## 2016-10-22 NOTE — Assessment & Plan Note (Signed)

## 2016-10-28 ENCOUNTER — Ambulatory Visit (INDEPENDENT_AMBULATORY_CARE_PROVIDER_SITE_OTHER)
Admission: RE | Admit: 2016-10-28 | Discharge: 2016-10-28 | Disposition: A | Discharge status: Home / Self Care | Payer: PPO | Source: Ambulatory Visit | Attending: Internal Medicine | Admitting: Internal Medicine

## 2016-10-28 ENCOUNTER — Telehealth: Payer: Self-pay

## 2016-10-28 DIAGNOSIS — Z13820 Encounter for screening for osteoporosis: Secondary | ICD-10-CM | POA: Diagnosis not present

## 2016-10-28 DIAGNOSIS — Z0000 Encounter for general adult medical examination without abnormal findings: Secondary | ICD-10-CM

## 2016-10-28 NOTE — Telephone Encounter (Signed)
Lab needed DEXA orders put in.

## 2016-11-12 DIAGNOSIS — Z124 Encounter for screening for malignant neoplasm of cervix: Secondary | ICD-10-CM | POA: Diagnosis not present

## 2016-11-12 DIAGNOSIS — Z6834 Body mass index (BMI) 34.0-34.9, adult: Secondary | ICD-10-CM | POA: Diagnosis not present

## 2016-12-09 ENCOUNTER — Other Ambulatory Visit: Payer: Self-pay | Admitting: Internal Medicine

## 2017-02-02 ENCOUNTER — Ambulatory Visit (INDEPENDENT_AMBULATORY_CARE_PROVIDER_SITE_OTHER): Payer: PPO | Admitting: General Practice

## 2017-02-02 DIAGNOSIS — Z23 Encounter for immunization: Secondary | ICD-10-CM

## 2017-02-26 ENCOUNTER — Other Ambulatory Visit: Payer: Self-pay | Admitting: Internal Medicine

## 2017-03-04 DIAGNOSIS — Z961 Presence of intraocular lens: Secondary | ICD-10-CM | POA: Diagnosis not present

## 2017-03-19 ENCOUNTER — Encounter: Payer: Self-pay | Admitting: Internal Medicine

## 2017-03-19 ENCOUNTER — Ambulatory Visit: Payer: PPO | Admitting: Internal Medicine

## 2017-03-19 VITALS — BP 118/64 | HR 73 | Temp 98.9°F | Ht 61.5 in | Wt 181.4 lb

## 2017-03-19 DIAGNOSIS — I10 Essential (primary) hypertension: Secondary | ICD-10-CM

## 2017-03-19 DIAGNOSIS — J029 Acute pharyngitis, unspecified: Secondary | ICD-10-CM | POA: Insufficient documentation

## 2017-03-19 MED ORDER — AZITHROMYCIN 250 MG PO TABS: ORAL_TABLET | ORAL | 1 refills | Status: DC

## 2017-03-19 NOTE — Progress Notes (Signed)
Subjective:    Patient ID: Deborah Schultz, female    DOB: 06/11/1945, 71 y.o.   MRN: [id number]  HPI   Here with 2-3 days acute onset fever, facial pain, pressure, headache, general weakness and malaise, and greenish d/c, with severe ST and non prod cough, but pt denies chest pain, wheezing, increased sob or doe, orthopnea, PND, increased LE swelling, palpitations, dizziness or syncope.   Past Medical History:  Diagnosis Date  . ALOPECIA 06/06/2008  . Anemia    iron deficiency  . ANEMIA-IRON DEFICIENCY 06/06/2008  . BICEPS TENDINITIS, LEFT 05/06/2010  . BRONCHITIS, ACUTE 06/18/2008  . CALLUSES, FEET, BILATERAL 06/06/2008  . Chronic pruritus   . CONSTIPATION 06/06/2008  . Cough 06/18/2008  . Degenerative disc disease, cervical   . Diverticulitis   . DIVERTICULITIS, HX OF 06/06/2008  . ELBOW PAIN 05/26/2010  . FATIGUE 06/18/2008  . HLD (hyperlipidemia)   . HTN (hypertension)   . HYPERLIPIDEMIA 06/06/2008  . OAB (overactive bladder)   . OVERACTIVE BLADDER 06/06/2008  . PERIPHERAL EDEMA 02/19/2009  . Peripheral edema 02/26/2011  . Right carpal tunnel syndrome   . SINUSITIS- ACUTE-NOS 01/08/2009   Past Surgical History:  Procedure Laterality Date  . ABDOMINAL HYSTERECTOMY    . APPENDECTOMY    . BREAST BIOPSY    . BUNIONECTOMY    . CATARACT EXTRACTION     bilateral 11/2009-no longer wears glasses  . ECTOPIC PREGNANCY SURGERY    . OOPHORECTOMY    . right thumb surgery      reports that  has never smoked. she has never used smokeless tobacco. She reports that she drinks alcohol. She reports that she does not use drugs. family history includes Cancer in her maternal aunt and maternal uncle; Diabetes in her brother; Hypertension in her mother; Ovarian cancer in her mother. Allergies  Allergen Reactions  . Nystatin Rash  . Esomeprazole Magnesium     REACTION: Headache  . Fexofenadine     REACTION: Hives  . Sulfonamide Derivatives     Per pt: unknown reaction   Current Outpatient  Medications on File Prior to Visit  Medication Sig Dispense Refill  . amLODipine (NORVASC) 5 MG tablet TAKE ONE TABLET BY MOUTH ONCE DAILY 90 tablet 2  . aspirin 81 MG tablet Take 81 mg by mouth daily.      Marland Kitchen. estradiol (ESTRACE) 1 MG tablet Take 1 tablet (1 mg total) by mouth daily. 90 tablet 3  . furosemide (LASIX) 40 MG tablet TAKE ONE TABLET BY MOUTH TWICE DAILY WITH THE SECOND DOSE ONLY TAKE AS NEEDED FOR PERSISTANT SWELLING 180 tablet 3  . gabapentin (NEURONTIN) 100 MG capsule Take 2 capsules (200 mg total) by mouth at bedtime. 60 capsule 3  . Glucosamine-Chondroitin (MOVE FREE PO) Take 1 capsule by mouth daily.    Marland Kitchen. ibuprofen (ADVIL,MOTRIN) 600 MG tablet Take 1 tablet (600 mg total) by mouth every 6 (six) hours as needed. 40 tablet 1  . irbesartan (AVAPRO) 300 MG tablet TAKE 1 TABLET BY MOUTH AT BEDTIME 90 tablet 2  . Multiple Vitamin (MULTIVITAMIN) tablet Take 1 tablet by mouth daily.      Marland Kitchen. oxybutynin (DITROPAN) 5 MG tablet TAKE ONE TABLET BY MOUTH TWICE DAILY 180 tablet 2  . pravastatin (PRAVACHOL) 40 MG tablet TAKE ONE TABLET BY MOUTH ONCE DAILY 90 tablet 2  . propranolol ER (INDERAL LA) 80 MG 24 hr capsule Take 1 capsule (80 mg total) by mouth daily. 90 capsule 0  . propranolol  ER (INDERAL LA) 80 MG 24 hr capsule TAKE ONE CAPSULE BY MOUTH ONCE DAILY 90 capsule 3  . triamcinolone cream (KENALOG) 0.1 % Apply 1 application topically 2 (two) times daily. 30 g 0  . VITAMIN D, CHOLECALCIFEROL, PO Take by mouth daily.      . Vitamin D, Ergocalciferol, (DRISDOL) 50000 units CAPS capsule Take 1 capsule (50,000 Units total) by mouth every 7 (seven) days. 12 capsule 0   No current facility-administered medications on file prior to visit.    Review of Systems All other system neg per pt    Objective:   Physical Exam BP 118/64 (BP Location: Left Arm, Patient Position: Sitting, Cuff Size: Large)   Pulse 73   Temp 98.9 F (37.2 C) (Oral)   Ht 5' 1.5" (1.562 m)   Wt 181 lb 6.4 oz (82.3 kg)    SpO2 96%   BMI 33.72 kg/m  VS noted, mild ill Constitutional: Pt appears in NAD HENT: Head: NCAT.  Right Ear: External ear normal.  Left Ear: External ear normal.  Eyes: . Pupils are equal, round, and reactive to light. Conjunctivae and EOM are normal Nose: without d/c or deformity Bilat tm's with mild erythema.  Max sinus areas non tender.  Pharynx with severe erythema, no exudate Neck: Neck supple. Gross normal ROM and with moderate tender bilat submandibular LA Cardiovascular: Normal rate and regular rhythm.   Pulmonary/Chest: Effort normal and breath sounds without rales or wheezing.  Neurological: Pt is alert. At baseline orientation, motor grossly intact Skin: Skin is warm. No rashes, other new lesions, no LE edema Psychiatric: Pt behavior is normal without agitation  No other exam findings    Assessment & Plan:

## 2017-03-19 NOTE — Patient Instructions (Addendum)
Please take all new medication as prescribed - the antibiotic  Please continue all other medications as before, and refills have been done if requested.  Please have the pharmacy call with any other refills you may need.  Please keep your appointments with your specialists as you may have planned   

## 2017-03-20 ENCOUNTER — Encounter: Payer: Self-pay | Admitting: Internal Medicine

## 2017-03-20 NOTE — Assessment & Plan Note (Signed)
stable overall by history and exam, recent data reviewed with pt, and pt to continue medical treatment as before,  to f/u any worsening symptoms or concerns BP Readings from Last 3 Encounters:  03/19/17 118/64  10/22/16 (!) 144/86  08/25/16 128/80

## 2017-03-20 NOTE — Assessment & Plan Note (Signed)
Mild to mod, for antibx course,  to f/u any worsening symptoms or concerns 

## 2017-05-20 ENCOUNTER — Other Ambulatory Visit: Payer: Self-pay | Admitting: Internal Medicine

## 2017-06-08 ENCOUNTER — Telehealth: Payer: Self-pay | Admitting: Internal Medicine

## 2017-06-08 ENCOUNTER — Other Ambulatory Visit: Payer: Self-pay | Admitting: Internal Medicine

## 2017-06-08 MED ORDER — TELMISARTAN 40 MG PO TABS: 40.0000 mg | ORAL_TABLET | Freq: Every day | ORAL | 2 refills | Status: DC

## 2017-06-08 NOTE — Telephone Encounter (Signed)
Ok to change the irbesartan 300 to micardis 40 mg daily, due to the recall  Ok to let pt know

## 2017-08-16 NOTE — Progress Notes (Signed)
[facility] [address] [place], [place] [address] Phone: [phone] Subjective:      CC: Right hand pain  [id number]  Deborah Schultz is a 72 y.o. female coming in with complaint of right hand pain since November 2018. Pain is over the thumb and 2nd finger. Patient has pain most of the time she states. Also no pattern to her pain. She has use topical analgesics to reduce pain. Patient notes that she had swelling in her hand in November but is unsure as to the cause.  Patient states that it can be severe.  Can have intermittent swelling.  Patient rates the severity is 9 out of 10.     Past Medical History:  Diagnosis Date  . ALOPECIA 06/06/2008  . Anemia    iron deficiency  . ANEMIA-IRON DEFICIENCY 06/06/2008  . BICEPS TENDINITIS, LEFT 05/06/2010  . BRONCHITIS, ACUTE 06/18/2008  . CALLUSES, FEET, BILATERAL 06/06/2008  . Chronic pruritus   . CONSTIPATION 06/06/2008  . Cough 06/18/2008  . Degenerative disc disease, cervical   . Diverticulitis   . DIVERTICULITIS, HX OF 06/06/2008  . ELBOW PAIN 05/26/2010  . FATIGUE 06/18/2008  . HLD (hyperlipidemia)   . HTN (hypertension)   . HYPERLIPIDEMIA 06/06/2008  . OAB (overactive bladder)   . OVERACTIVE BLADDER 06/06/2008  . PERIPHERAL EDEMA 02/19/2009  . Peripheral edema 02/26/2011  . Right carpal tunnel syndrome   . SINUSITIS- ACUTE-NOS 01/08/2009   Past Surgical History:  Procedure Laterality Date  . ABDOMINAL HYSTERECTOMY    . APPENDECTOMY    . BREAST BIOPSY    . BUNIONECTOMY    . CATARACT EXTRACTION     bilateral 11/2009-no longer wears glasses  . ECTOPIC PREGNANCY SURGERY    . OOPHORECTOMY    . right thumb surgery     Social History   Socioeconomic History  . Marital status: Married    Spouse name: Not on file  . Number of children: Not on file  . Years of education: Not on file  . Highest education level: Not on file  Occupational History  . Not on file  Social Needs  . Financial resource  strain: Not on file  . Food insecurity:    Worry: Not on file    Inability: Not on file  . Transportation needs:    Medical: Not on file    Non-medical: Not on file  Tobacco Use  . Smoking status: Never Smoker  . Smokeless tobacco: Never Used  Substance and Sexual Activity  . Alcohol use: Yes    Alcohol/week: 0.0 oz    Comment: rare  . Drug use: No  . Sexual activity: Not on file  Lifestyle  . Physical activity:    Days per week: Not on file    Minutes per session: Not on file  . Stress: Not on file  Relationships  . Social connections:    Talks on phone: Not on file    Gets together: Not on file    Attends religious service: Not on file    Active member of club or organization: Not on file    Attends meetings of clubs or organizations: Not on file    Relationship status: Not on file  Other Topics Concern  . 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    Allergies  Allergen Reactions  . Nystatin Rash  . Esomeprazole Magnesium     REACTION: Headache  .  Fexofenadine     REACTION: Hives  . Sulfonamide Derivatives     Per pt: unknown reaction   Family History  Problem Relation Age of Onset  . Hypertension Mother   . Ovarian cancer Mother   . Diabetes Brother   . Cancer Maternal Aunt        unspecif if mat or pat  . Cancer Maternal Uncle        unspecif if mat or pat  . Colon cancer Neg Hx      Past medical history, social, surgical and family history all reviewed in electronic medical record.  No pertanent information unless stated regarding to the chief complaint.   Review of Systems:Review of systems updated and as accurate as of 08/17/17  No headache, visual changes, nausea, vomiting, diarrhea, constipation, dizziness, abdominal pain, skin rash, fevers, chills, night sweats, weight loss, swollen lymph nodes, body aches,  chest pain, shortness of breath, mood changes.  Positive muscle aches and  joint swelling  Objective  Blood pressure (!) 148/80, pulse 64, height 5' 1.5" (1.562 m), weight 179 lb (81.2 kg), SpO2 98 %. Systems examined below as of 08/17/17   General: No apparent distress alert and oriented x3 mood and affect normal, dressed appropriately.  HEENT: Pupils equal, extraocular movements intact  Respiratory: Patient's speak in full sentences and does not appear short of breath  Cardiovascular: No lower extremity edema, non tender, no erythema  Skin: Warm dry intact with no signs of infection or rash on extremities or on axial skeleton.  Abdomen: Soft nontender  Neuro: Cranial nerves II through XII are intact, neurovascularly intact in all extremities with 2+ DTRs and 2+ pulses.  Lymph: No lymphadenopathy of posterior or anterior cervical chain or axillae bilaterally.  Gait normal with good balance and coordination.  MSK:  Non tender with full range of motion and good stability and symmetric strength and tone of shoulders, elbows, hip, knee and ankles bilaterally.  Mild arthritic changes of multiple joints Wrist: right  Inspection shows the patient does have significant swelling noted.  Does seem to be over the first metatarsal.  Possible mass noted.  Severely tender to palpation.  Positive grind test.  Mild loss of the thenar eminence compared to the contralateral side.  Patient does have some dorsal swelling of the hand noted as well compared to the contralateral side.  Limited musculoskeletal ultrasound was performed and interpreted by Judi Saa  Limited ultrasound of the [facility] joint shows arthritic changes with possible synovitis.  Patient does have a large possible tophi versus possible soft tissue mass noted.  Increased vascularity in the area noted.  Does seem to arrive from the abductor pollicis longus tendon sheath. 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      Note: This dictation was prepared with Dragon dictation along with smaller phrase technology. Any transcriptional errors that result from this process are unintentional.

## 2017-08-17 ENCOUNTER — Encounter: Payer: Self-pay | Admitting: Family Medicine

## 2017-08-17 ENCOUNTER — Other Ambulatory Visit (INDEPENDENT_AMBULATORY_CARE_PROVIDER_SITE_OTHER): Payer: PPO

## 2017-08-17 ENCOUNTER — Ambulatory Visit: Payer: PPO | Admitting: Family Medicine

## 2017-08-17 ENCOUNTER — Ambulatory Visit (INDEPENDENT_AMBULATORY_CARE_PROVIDER_SITE_OTHER)
Admission: RE | Admit: 2017-08-17 | Discharge: 2017-08-17 | Disposition: A | Discharge status: Home / Self Care | Payer: PPO | Source: Ambulatory Visit | Attending: Family Medicine | Admitting: Family Medicine

## 2017-08-17 ENCOUNTER — Ambulatory Visit: Payer: Self-pay

## 2017-08-17 VITALS — BP 148/80 | HR 64 | Ht 61.5 in | Wt 179.0 lb

## 2017-08-17 DIAGNOSIS — M79641 Pain in right hand: Secondary | ICD-10-CM

## 2017-08-17 DIAGNOSIS — M1811 Unilateral primary osteoarthritis of first carpometacarpal joint, right hand: Secondary | ICD-10-CM | POA: Diagnosis not present

## 2017-08-17 DIAGNOSIS — M2550 Pain in unspecified joint: Secondary | ICD-10-CM

## 2017-08-17 DIAGNOSIS — M180 Bilateral primary osteoarthritis of first carpometacarpal joints: Secondary | ICD-10-CM | POA: Diagnosis not present

## 2017-08-17 LAB — COMPREHENSIVE METABOLIC PANEL
ALBUMIN: 4.3 g/dL (ref 3.5–5.2)
ALT: 15 U/L (ref 0–35)
AST: 17 U/L (ref 0–37)
Alkaline Phosphatase: 65 U/L (ref 39–117)
BUN: 19 mg/dL (ref 6–23)
CALCIUM: 9.9 mg/dL (ref 8.4–10.5)
CHLORIDE: 102 meq/L (ref 96–112)
CO2: 31 meq/L (ref 19–32)
CREATININE: 0.87 mg/dL (ref 0.40–1.20)
GFR: 82.73 mL/min (ref >60.00)
Glucose, Bld: 89 mg/dL (ref 70–99)
POTASSIUM: 4.1 meq/L (ref 3.5–5.1)
Sodium: 139 mEq/L (ref 135–145)
Total Bilirubin: 0.3 mg/dL (ref 0.2–1.2)
Total Protein: 7.5 g/dL (ref 6.0–8.3)

## 2017-08-17 LAB — URIC ACID: Uric Acid, Serum: 3.4 mg/dL (ref 2.4–7.0)

## 2017-08-17 LAB — C-REACTIVE PROTEIN: CRP: 1.2 mg/dL (ref 0.5–20.0)

## 2017-08-17 LAB — SEDIMENTATION RATE: SED RATE: 26 mm/h (ref 0–30)

## 2017-08-17 NOTE — Patient Instructions (Signed)
Good to see you  Ice can help  Labs and xray downstairs today  Try to wear the brace day and night for 1 week then nightly for 2 weeks Try the medicine I am giving you 2 times daily for 3 days.  pennsaid pinkie amount topically 2 times daily as needed.  If not better on Friday can call and we can consider a MRI of the hand.

## 2017-08-17 NOTE — Assessment & Plan Note (Signed)
[facility] arthritis.  Discussed icing regimen and home exercise. Patient does have a soft tissue mass sounds noted.  Somewhat concerning to secondary to the vascularity.  Differential includes a possible gouty deposit.  Patient given 3 days of colchicine, topical anti-inflammatories, bracing.  I would like patient to follow-up again in 1 week.  X-rays pending.  If worsening symptoms advanced imaging would be warranted.

## 2017-08-18 LAB — CALCIUM, IONIZED: CALCIUM ION: 5.49 mg/dL (ref 4.8–5.6)

## 2017-08-18 LAB — LACTATE DEHYDROGENASE: LDH: 155 U/L (ref 120–250)

## 2017-08-18 LAB — PTH, INTACT AND CALCIUM
Calcium: 10.1 mg/dL (ref 8.6–10.4)
PTH: 51 pg/mL (ref 14–64)

## 2017-08-20 ENCOUNTER — Other Ambulatory Visit: Payer: Self-pay | Admitting: Internal Medicine

## 2017-08-20 DIAGNOSIS — Z1231 Encounter for screening mammogram for malignant neoplasm of breast: Secondary | ICD-10-CM

## 2017-09-08 ENCOUNTER — Other Ambulatory Visit: Payer: Self-pay | Admitting: *Deleted

## 2017-09-08 DIAGNOSIS — M79641 Pain in right hand: Secondary | ICD-10-CM

## 2017-09-09 ENCOUNTER — Ambulatory Visit
Admission: RE | Admit: 2017-09-09 | Discharge: 2017-09-09 | Disposition: A | Discharge status: Home / Self Care | Payer: PPO | Source: Ambulatory Visit | Attending: Family Medicine | Admitting: Family Medicine

## 2017-09-09 ENCOUNTER — Other Ambulatory Visit: Payer: Self-pay | Admitting: Family Medicine

## 2017-09-09 DIAGNOSIS — M79641 Pain in right hand: Secondary | ICD-10-CM

## 2017-09-09 DIAGNOSIS — M19041 Primary osteoarthritis, right hand: Secondary | ICD-10-CM | POA: Diagnosis not present

## 2017-09-16 NOTE — Progress Notes (Signed)
[facility] [address] [place], [place] [address] Phone: [phone] Subjective:     CC: Thumb pain follow-up  [id number]  Deborah Schultz is a 72 y.o. female coming in with complaint of pain.  Patient was seen previously and there was concern for significant amount of swelling.  Laboratory work-up was unremarkable.  Sent for an MRI secondary to soft tissue inflammation and enlargement.  MRI independently visualized by me showed severe first CMC arthritis and a 9 mm subchondral cyst in the base of the first metacarpal.     Past Medical History:  Diagnosis Date  . ALOPECIA 06/06/2008  . Anemia    iron deficiency  . ANEMIA-IRON DEFICIENCY 06/06/2008  . BICEPS TENDINITIS, LEFT 05/06/2010  . BRONCHITIS, ACUTE 06/18/2008  . CALLUSES, FEET, BILATERAL 06/06/2008  . Chronic pruritus   . CONSTIPATION 06/06/2008  . Cough 06/18/2008  . Degenerative disc disease, cervical   . Diverticulitis   . DIVERTICULITIS, HX OF 06/06/2008  . ELBOW PAIN 05/26/2010  . FATIGUE 06/18/2008  . HLD (hyperlipidemia)   . HTN (hypertension)   . HYPERLIPIDEMIA 06/06/2008  . OAB (overactive bladder)   . OVERACTIVE BLADDER 06/06/2008  . PERIPHERAL EDEMA 02/19/2009  . Peripheral edema 02/26/2011  . Right carpal tunnel syndrome   . SINUSITIS- ACUTE-NOS 01/08/2009   Past Surgical History:  Procedure Laterality Date  . ABDOMINAL HYSTERECTOMY    . APPENDECTOMY    . BREAST BIOPSY    . BUNIONECTOMY    . CATARACT EXTRACTION     bilateral 11/2009-no longer wears glasses  . ECTOPIC PREGNANCY SURGERY    . OOPHORECTOMY    . right thumb surgery     Social History   Socioeconomic History  . Marital status: Married    Spouse name: Not on file  . Number of children: Not on file  . Years of education: Not on file  . Highest education level: Not on file  Occupational History  . Not on file  Social Needs  . Financial resource strain: Not on file  . Food insecurity:    Worry: Not on file      Inability: Not on file  . Transportation needs:    Medical: Not on file    Non-medical: Not on file  Tobacco Use  . Smoking status: Never Smoker  . Smokeless tobacco: Never Used  Substance and Sexual Activity  . Alcohol use: Yes    Alcohol/week: 0.0 oz    Comment: rare  . Drug use: No  . Sexual activity: Not on file  Lifestyle  . Physical activity:    Days per week: Not on file    Minutes per session: Not on file  . Stress: Not on file  Relationships  . Social connections:    Talks on phone: Not on file    Gets together: Not on file    Attends religious service: Not on file    Active member of club or organization: Not on file    Attends meetings of clubs or organizations: Not on file    Relationship status: Not on file  Other Topics Concern  . 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    Allergies  Allergen Reactions  . Nystatin Rash  . Esomeprazole Magnesium     REACTION: Headache  . Fexofenadine     REACTION: Hives  . Sulfonamide Derivatives     Per pt: unknown reaction  Family History  Problem Relation Age of Onset  . Hypertension Mother   . Ovarian cancer Mother   . Diabetes Brother   . Cancer Maternal Aunt        unspecif if mat or pat  . Cancer Maternal Uncle        unspecif if mat or pat  . Colon cancer Neg Hx      Past medical history, social, surgical and family history all reviewed in electronic medical record.  No pertanent information unless stated regarding to the chief complaint.   Review of Systems:Review of systems updated and as accurate as of 09/20/17  No headache, visual changes, nausea, vomiting, diarrhea, constipation, dizziness, abdominal pain, skin rash, fevers, chills, night sweats, weight loss, swollen lymph nodes, body aches, joint swelling, muscle aches, chest pain, shortness of breath, mood changes.   Objective  Blood pressure 130/84, pulse 70, height   (1.549 m), SpO2 98 %. Systems examined below as of 09/20/17   General: No apparent distress alert and oriented x3 mood and affect normal, dressed appropriately.  HEENT: Pupils equal, extraocular movements intact  Respiratory: Patient's speak in full sentences and does not appear short of breath  Cardiovascular: No lower extremity edema, non tender, no erythema  Skin: Warm dry intact with no signs of infection or rash on extremities or on axial skeleton.  Abdomen: Soft nontender  Neuro: Cranial nerves II through XII are intact, neurovascularly intact in all extremities with 2+ DTRs and 2+ pulses.  Lymph: No lymphadenopathy of posterior or anterior cervical chain or axillae bilaterally.  Gait normal with good balance and coordination.  MSK:  Non tender with full range of motion and good stability and symmetric strength and tone of shoulders, elbows,  hip, knee and ankles bilaterally.  Moderate arthritic changes of multiple joints. Right hand exam shows arthritic changes.  Does have some atrophy of the musculature.  Patient soft tissue masses appreciated previously is decreased in size.  Positive grind test noted.  Strength 4 out of 5 but does have some atrophy of the contralateral thenar eminence as well.   Procedure: Real-time Ultrasound Guided Injection of right CMC joint Device: GE Logiq Q7 Ultrasound guided injection is preferred based studies that show increased duration, increased effect, greater accuracy, decreased procedural pain, increased response rate, and decreased cost with ultrasound guided versus blind injection.  Verbal informed consent obtained.  Time-out conducted.  Noted no overlying erythema, induration, or other signs of local infection.  Skin prepped in a sterile fashion.  Local anesthesia: Topical Ethyl chloride.  With sterile technique and under real time ultrasound guidance: With a 25-gauge half inch needle injected with 0.5 cc of 0.5% Marcaine and 0.5 cc of Kenalog 40  mg/mL Completed without difficulty  Pain immediately resolved suggesting accurate placement of the medication.  Advised to call if fevers/chills, erythema, induration, drainage, or persistent bleeding.  Images permanently stored and available for review in the ultrasound unit.  Impression: Technically successful ultrasound guided injection.    Impression and Recommendations:     This case required medical decision making of moderate complexity.      Note: This dictation was prepared with Dragon dictation along with smaller phrase technology. Any transcriptional errors that result from this process are unintentional.

## 2017-09-20 ENCOUNTER — Ambulatory Visit: Payer: PPO | Admitting: Family Medicine

## 2017-09-20 ENCOUNTER — Ambulatory Visit: Payer: Self-pay

## 2017-09-20 ENCOUNTER — Encounter: Payer: Self-pay | Admitting: Family Medicine

## 2017-09-20 VITALS — BP 130/84 | HR 70 | Ht 61.0 in

## 2017-09-20 DIAGNOSIS — M1811 Unilateral primary osteoarthritis of first carpometacarpal joint, right hand: Secondary | ICD-10-CM

## 2017-09-20 DIAGNOSIS — M79641 Pain in right hand: Secondary | ICD-10-CM | POA: Diagnosis not present

## 2017-09-20 NOTE — Assessment & Plan Note (Signed)
Injected today.  Tolerated the procedure well.  Discussed bracing at night.  Discussed the other possibilities would include surgical intervention and likely joint replacement.  Discussed vitamin D supplementation and topical anti-inflammatories.  Follow-up again in 4 weeks

## 2017-09-20 NOTE — Patient Instructions (Signed)
Good to see you  Deborah Schultz is your friend.  Ace wrap with a popsicle stick at night if needed .zpens Can repeat injection in 10 weeks if needed See me again in in 4 weeks if you want to discuss otherwise see me when you need me

## 2017-10-01 ENCOUNTER — Ambulatory Visit
Admission: RE | Admit: 2017-10-01 | Discharge: 2017-10-01 | Disposition: A | Discharge status: Home / Self Care | Payer: PPO | Source: Ambulatory Visit | Attending: Internal Medicine | Admitting: Internal Medicine

## 2017-10-01 DIAGNOSIS — Z1231 Encounter for screening mammogram for malignant neoplasm of breast: Secondary | ICD-10-CM

## 2017-10-04 ENCOUNTER — Other Ambulatory Visit: Payer: Self-pay | Admitting: Internal Medicine

## 2017-10-04 DIAGNOSIS — R928 Other abnormal and inconclusive findings on diagnostic imaging of breast: Secondary | ICD-10-CM

## 2017-10-11 ENCOUNTER — Ambulatory Visit
Admission: RE | Admit: 2017-10-11 | Discharge: 2017-10-11 | Disposition: A | Discharge status: Home / Self Care | Payer: PPO | Source: Ambulatory Visit | Attending: Internal Medicine | Admitting: Internal Medicine

## 2017-10-11 DIAGNOSIS — R928 Other abnormal and inconclusive findings on diagnostic imaging of breast: Secondary | ICD-10-CM | POA: Diagnosis not present

## 2017-10-11 DIAGNOSIS — N6012 Diffuse cystic mastopathy of left breast: Secondary | ICD-10-CM | POA: Diagnosis not present

## 2017-10-18 ENCOUNTER — Ambulatory Visit: Payer: PPO | Admitting: Family Medicine

## 2017-10-22 ENCOUNTER — Other Ambulatory Visit (INDEPENDENT_AMBULATORY_CARE_PROVIDER_SITE_OTHER): Payer: PPO

## 2017-10-22 DIAGNOSIS — Z0000 Encounter for general adult medical examination without abnormal findings: Secondary | ICD-10-CM

## 2017-10-22 LAB — URINALYSIS, ROUTINE W REFLEX MICROSCOPIC
Bilirubin Urine: NEGATIVE
Hgb urine dipstick: NEGATIVE
KETONES UR: NEGATIVE
LEUKOCYTES UA: NEGATIVE
Nitrite: NEGATIVE
SPECIFIC GRAVITY, URINE: 1.01 (ref 1.000–1.030)
Total Protein, Urine: NEGATIVE
URINE GLUCOSE: NEGATIVE
UROBILINOGEN UA: 0.2 (ref 0.0–1.0)
pH: 7 (ref 5.0–8.0)

## 2017-10-22 LAB — LIPID PANEL
CHOL/HDL RATIO: 3
Cholesterol: 170 mg/dL (ref 0–200)
HDL: 64.9 mg/dL (ref >39.00)
LDL CALC: 85 mg/dL (ref 0–99)
NonHDL: 105.34
Triglycerides: 104 mg/dL (ref 0.0–149.0)
VLDL: 20.8 mg/dL (ref 0.0–40.0)

## 2017-10-22 LAB — CBC WITH DIFFERENTIAL/PLATELET
BASOS PCT: 0.7 % (ref 0.0–3.0)
Basophils Absolute: 0 10*3/uL (ref 0.0–0.1)
EOS PCT: 3.3 % (ref 0.0–5.0)
Eosinophils Absolute: 0.2 10*3/uL (ref 0.0–0.7)
HCT: 38.6 % (ref 36.0–46.0)
Hemoglobin: 13 g/dL (ref 12.0–15.0)
LYMPHS ABS: 2.6 10*3/uL (ref 0.7–4.0)
Lymphocytes Relative: 39.2 % (ref 12.0–46.0)
MCHC: 33.6 g/dL (ref 30.0–36.0)
MCV: 91.1 fl (ref 78.0–100.0)
MONO ABS: 0.6 10*3/uL (ref 0.1–1.0)
Monocytes Relative: 8.8 % (ref 3.0–12.0)
NEUTROS PCT: 48 % (ref 43.0–77.0)
Neutro Abs: 3.1 10*3/uL (ref 1.4–7.7)
Platelets: 267 10*3/uL (ref 150.0–400.0)
RBC: 4.24 Mil/uL (ref 3.87–5.11)
RDW: 14.1 % (ref 11.5–15.5)
WBC: 6.5 10*3/uL (ref 4.0–10.5)

## 2017-10-22 LAB — BASIC METABOLIC PANEL
BUN: 13 mg/dL (ref 6–23)
CHLORIDE: 102 meq/L (ref 96–112)
CO2: 29 meq/L (ref 19–32)
Calcium: 9.7 mg/dL (ref 8.4–10.5)
Creatinine, Ser: 0.95 mg/dL (ref 0.40–1.20)
GFR: 74.71 mL/min (ref >60.00)
GLUCOSE: 93 mg/dL (ref 70–99)
POTASSIUM: 4.2 meq/L (ref 3.5–5.1)
SODIUM: 140 meq/L (ref 135–145)

## 2017-10-22 LAB — HEPATIC FUNCTION PANEL
ALT: 20 U/L (ref 0–35)
AST: 18 U/L (ref 0–37)
Albumin: 4 g/dL (ref 3.5–5.2)
Alkaline Phosphatase: 61 U/L (ref 39–117)
BILIRUBIN TOTAL: 0.4 mg/dL (ref 0.2–1.2)
Bilirubin, Direct: 0.1 mg/dL (ref 0.0–0.3)
Total Protein: 7 g/dL (ref 6.0–8.3)

## 2017-10-22 LAB — TSH: TSH: 3.47 u[IU]/mL (ref 0.35–4.50)

## 2017-10-26 ENCOUNTER — Ambulatory Visit (INDEPENDENT_AMBULATORY_CARE_PROVIDER_SITE_OTHER): Payer: PPO | Admitting: Internal Medicine

## 2017-10-26 ENCOUNTER — Encounter: Payer: Self-pay | Admitting: Internal Medicine

## 2017-10-26 VITALS — BP 132/82 | HR 77 | Temp 98.0°F | Ht 61.0 in | Wt 178.0 lb

## 2017-10-26 DIAGNOSIS — Z23 Encounter for immunization: Secondary | ICD-10-CM

## 2017-10-26 DIAGNOSIS — Z0000 Encounter for general adult medical examination without abnormal findings: Secondary | ICD-10-CM

## 2017-10-26 NOTE — Assessment & Plan Note (Signed)

## 2017-10-26 NOTE — Patient Instructions (Addendum)
You had the Pneumovax pneumonia shot today  Please continue all other medications as before, and refills have been done if requested.  Please have the pharmacy call with any other refills you may need.  Please continue your efforts at being more active, low cholesterol diet, and weight control.  You are otherwise up to date with prevention measures today.  Please keep your appointments with your specialists as you may have planned  Please return in 1 year for your yearly visit, or sooner if needed, with Lab testing done 3-5 days before  

## 2017-10-26 NOTE — Progress Notes (Signed)
Subjective:    Patient ID: Deborah FallenLynda M Schultz, female    DOB: 12-12-1945, 72 y.o.   MRN: [id number]  HPI    Here for wellness and f/u;  Overall doing ok;  Pt denies Chest pain, worsening SOB, DOE, wheezing, orthopnea, PND, worsening LE edema, palpitations, dizziness or syncope.  Pt denies neurological change such as new headache, facial or extremity weakness.  Pt denies polydipsia, polyuria, or low sugar symptoms. Pt states overall good compliance with treatment and medications, good tolerability, and has been trying to follow appropriate diet.  Pt denies worsening depressive symptoms, suicidal ideation or panic. No fever, night sweats, wt loss, loss of appetite, or other constitutional symptoms.  Pt states good ability with ADL's, has low fall risk, home safety reviewed and adequate, no other significant changes in hearing or vision, and only occasionally active with exercise. No new complaints Past Medical History:  Diagnosis Date  . ALOPECIA 06/06/2008  . Anemia    iron deficiency  . ANEMIA-IRON DEFICIENCY 06/06/2008  . BICEPS TENDINITIS, LEFT 05/06/2010  . BRONCHITIS, ACUTE 06/18/2008  . CALLUSES, FEET, BILATERAL 06/06/2008  . Chronic pruritus   . CONSTIPATION 06/06/2008  . Cough 06/18/2008  . Degenerative disc disease, cervical   . Diverticulitis   . DIVERTICULITIS, HX OF 06/06/2008  . ELBOW PAIN 05/26/2010  . FATIGUE 06/18/2008  . HLD (hyperlipidemia)   . HTN (hypertension)   . HYPERLIPIDEMIA 06/06/2008  . OAB (overactive bladder)   . OVERACTIVE BLADDER 06/06/2008  . PERIPHERAL EDEMA 02/19/2009  . Peripheral edema 02/26/2011  . Right carpal tunnel syndrome   . SINUSITIS- ACUTE-NOS 01/08/2009   Past Surgical History:  Procedure Laterality Date  . ABDOMINAL HYSTERECTOMY    . APPENDECTOMY    . BREAST BIOPSY    . BUNIONECTOMY    . CATARACT EXTRACTION     bilateral 11/2009-no longer wears glasses  . ECTOPIC PREGNANCY SURGERY    . OOPHORECTOMY    . right thumb surgery      reports that she  has never smoked. She has never used smokeless tobacco. She reports that she drinks alcohol. She reports that she does not use drugs. family history includes Cancer in her maternal aunt and maternal uncle; Diabetes in her brother; Hypertension in her mother; Ovarian cancer in her mother. Allergies  Allergen Reactions  . Nystatin Rash  . Esomeprazole Magnesium     REACTION: Headache  . Fexofenadine     REACTION: Hives  . Sulfonamide Derivatives     Per pt: unknown reaction   Current Outpatient Medications on File Prior to Visit  Medication Sig Dispense Refill  . amLODipine (NORVASC) 5 MG tablet TAKE 1 TABLET BY MOUTH ONCE DAILY 90 tablet 1  . aspirin 81 MG tablet Take 81 mg by mouth daily.      Marland Kitchen. estradiol (ESTRACE) 1 MG tablet Take 1 tablet (1 mg total) by mouth daily. 90 tablet 3  . furosemide (LASIX) 40 MG tablet TAKE ONE TABLET BY MOUTH TWICE DAILY WITH THE SECOND DOSE ONLY AS NEEDED FOR PERSISTANT SWELLING 180 tablet 1  . gabapentin (NEURONTIN) 100 MG capsule Take 2 capsules (200 mg total) by mouth at bedtime. 60 capsule 3  . Glucosamine-Chondroitin (MOVE FREE PO) Take 1 capsule by mouth daily.    Marland Kitchen. ibuprofen (ADVIL,MOTRIN) 600 MG tablet Take 1 tablet (600 mg total) by mouth every 6 (six) hours as needed. 40 tablet 1  . Multiple Vitamin (MULTIVITAMIN) tablet Take 1 tablet by mouth daily.      .Marland Kitchen  oxybutynin (DITROPAN) 5 MG tablet TAKE ONE TABLET BY MOUTH TWICE DAILY 180 tablet 2  . pravastatin (PRAVACHOL) 40 MG tablet TAKE 1 TABLET BY MOUTH ONCE DAILY 90 tablet 2  . propranolol ER (INDERAL LA) 80 MG 24 hr capsule TAKE ONE CAPSULE BY MOUTH ONCE DAILY 90 capsule 3  . telmisartan (MICARDIS) 40 MG tablet Take 1 tablet (40 mg total) by mouth daily. 90 tablet 2  . triamcinolone cream (KENALOG) 0.1 % Apply 1 application topically 2 (two) times daily. 30 g 0  . VITAMIN D, CHOLECALCIFEROL, PO Take by mouth daily.       No current facility-administered medications on file prior to visit.     Review of Systems Constitutional: Negative for other unusual diaphoresis, sweats, appetite or weight changes HENT: Negative for other worsening hearing loss, ear pain, facial swelling, mouth sores or neck stiffness.   Eyes: Negative for other worsening pain, redness or other visual disturbance.  Respiratory: Negative for other stridor or swelling Cardiovascular: Negative for other palpitations or other chest pain  Gastrointestinal: Negative for worsening diarrhea or loose stools, blood in stool, distention or other pain Genitourinary: Negative for hematuria, flank pain or other change in urine volume.  Musculoskeletal: Negative for myalgias or other joint swelling.  Skin: Negative for other color change, or other wound or worsening drainage.  Neurological: Negative for other syncope or numbness. Hematological: Negative for other adenopathy or swelling Psychiatric/Behavioral: Negative for hallucinations, other worsening agitation, SI, self-injury, or new decreased concentration All other system neg per pt    Objective:   Physical Exam BP 132/82   Pulse 77   Temp 98 F (36.7 C) (Oral)   Ht 5\' 1"  (1.549 m)   Wt 178 lb (80.7 kg)   SpO2 97%   BMI 33.63 kg/m  VS noted,  Constitutional: Pt is oriented to person, place, and time. Appears well-developed and well-nourished, in no significant distress and comfortable Head: Normocephalic and atraumatic  Eyes: Conjunctivae and EOM are normal. Pupils are equal, round, and reactive to light Right Ear: External ear normal without discharge Left Ear: External ear normal without discharge Nose: Nose without discharge or deformity Mouth/Throat: Oropharynx is without other ulcerations and moist  Neck: Normal range of motion. Neck supple. No JVD present. No tracheal deviation present or significant neck LA or mass Cardiovascular: Normal rate, regular rhythm, normal heart sounds and intact distal pulses.   Pulmonary/Chest: WOB normal and breath  sounds without rales or wheezing  Abdominal: Soft. Bowel sounds are normal. NT. No HSM  Musculoskeletal: Normal range of motion. Exhibits trace bilat ankle edema Lymphadenopathy: Has no other cervical adenopathy.  Neurological: Pt is alert and oriented to person, place, and time. Pt has normal reflexes. No cranial nerve deficit. Motor grossly intact, Gait intact Skin: Skin is warm and dry. No rash noted or new ulcerations but has marked alopecia female pattern baldness Psychiatric:  Has normal mood and affect. Behavior is normal without agitation No other exam findings    Assessment & Plan:

## 2017-10-27 ENCOUNTER — Encounter: Payer: PPO | Admitting: Internal Medicine

## 2017-11-15 DIAGNOSIS — Z01419 Encounter for gynecological examination (general) (routine) without abnormal findings: Secondary | ICD-10-CM | POA: Diagnosis not present

## 2017-11-15 DIAGNOSIS — Z6834 Body mass index (BMI) 34.0-34.9, adult: Secondary | ICD-10-CM | POA: Diagnosis not present

## 2017-11-25 ENCOUNTER — Other Ambulatory Visit: Payer: Self-pay | Admitting: Internal Medicine

## 2017-11-29 ENCOUNTER — Other Ambulatory Visit: Payer: Self-pay | Admitting: Internal Medicine

## 2017-12-16 ENCOUNTER — Ambulatory Visit (INDEPENDENT_AMBULATORY_CARE_PROVIDER_SITE_OTHER): Payer: PPO

## 2017-12-16 DIAGNOSIS — Z23 Encounter for immunization: Secondary | ICD-10-CM | POA: Diagnosis not present

## 2017-12-16 DIAGNOSIS — N6459 Other signs and symptoms in breast: Secondary | ICD-10-CM | POA: Diagnosis not present

## 2018-03-10 ENCOUNTER — Other Ambulatory Visit: Payer: Self-pay | Admitting: Internal Medicine

## 2018-03-21 ENCOUNTER — Other Ambulatory Visit: Payer: Self-pay | Admitting: Internal Medicine

## 2018-05-02 ENCOUNTER — Other Ambulatory Visit: Payer: Self-pay | Admitting: Internal Medicine

## 2018-05-11 DIAGNOSIS — H524 Presbyopia: Secondary | ICD-10-CM | POA: Diagnosis not present

## 2018-05-11 DIAGNOSIS — Z961 Presence of intraocular lens: Secondary | ICD-10-CM | POA: Diagnosis not present

## 2018-05-11 DIAGNOSIS — I10 Essential (primary) hypertension: Secondary | ICD-10-CM | POA: Diagnosis not present

## 2018-06-13 ENCOUNTER — Encounter: Payer: Self-pay | Admitting: Internal Medicine

## 2018-06-13 ENCOUNTER — Ambulatory Visit (INDEPENDENT_AMBULATORY_CARE_PROVIDER_SITE_OTHER): Payer: PPO | Admitting: Internal Medicine

## 2018-06-13 VITALS — BP 124/86 | HR 78 | Temp 98.3°F | Ht 61.0 in | Wt 180.0 lb

## 2018-06-13 DIAGNOSIS — I10 Essential (primary) hypertension: Secondary | ICD-10-CM

## 2018-06-13 DIAGNOSIS — R05 Cough: Secondary | ICD-10-CM

## 2018-06-13 DIAGNOSIS — R059 Cough, unspecified: Secondary | ICD-10-CM

## 2018-06-13 DIAGNOSIS — R609 Edema, unspecified: Secondary | ICD-10-CM | POA: Diagnosis not present

## 2018-06-13 MED ORDER — AZITHROMYCIN 250 MG PO TABS: ORAL_TABLET | ORAL | 1 refills | Status: DC

## 2018-06-13 MED ORDER — HYDROCODONE-HOMATROPINE 5-1.5 MG/5ML PO SYRP: 5.0000 mL | ORAL_SOLUTION | Freq: Four times a day (QID) | ORAL | 0 refills | Status: AC | PRN

## 2018-06-13 NOTE — Assessment & Plan Note (Signed)
Mild to mod, c/w bronchitis vs pna, declines cxr, for antibx course, cough med prn,  to f/u any worsening symptoms or concerns 

## 2018-06-13 NOTE — Patient Instructions (Signed)
Please take all new medication as prescribed - the antibiotic, and cough medicine if needed  Please continue all other medications as before, and refills have been done if requested.  Please have the pharmacy call with any other refills you may need.  Please keep your appointments with your specialists as you may have planned     

## 2018-06-13 NOTE — Assessment & Plan Note (Signed)
stable overall by history and exam, recent data reviewed with pt, and pt to continue medical treatment as before,  to f/u any worsening symptoms or concerns  

## 2018-06-13 NOTE — Progress Notes (Signed)
Subjective:    Patient ID: Deborah Schultz, female    DOB: 30-Oct-1945, 73 y.o.   MRN: [id number]  HPI   Here with acute onset mild to mod 2-3 days ST, HA, general weakness and malaise, with prod cough greenish sputum, but Pt denies chest pain, increased sob or doe, wheezing, orthopnea, PND, increased LE swelling, palpitations, dizziness or syncope.  Pt denies new neurological symptoms such as new headache, or facial or extremity weakness or numbness   Pt denies polydipsia, polyuria.   Past Medical History:  Diagnosis Date  . ALOPECIA 06/06/2008  . Anemia    iron deficiency  . ANEMIA-IRON DEFICIENCY 06/06/2008  . BICEPS TENDINITIS, LEFT 05/06/2010  . BRONCHITIS, ACUTE 06/18/2008  . CALLUSES, FEET, BILATERAL 06/06/2008  . Chronic pruritus   . CONSTIPATION 06/06/2008  . Cough 06/18/2008  . Degenerative disc disease, cervical   . Diverticulitis   . DIVERTICULITIS, HX OF 06/06/2008  . ELBOW PAIN 05/26/2010  . FATIGUE 06/18/2008  . HLD (hyperlipidemia)   . HTN (hypertension)   . HYPERLIPIDEMIA 06/06/2008  . OAB (overactive bladder)   . OVERACTIVE BLADDER 06/06/2008  . PERIPHERAL EDEMA 02/19/2009  . Peripheral edema 02/26/2011  . Right carpal tunnel syndrome   . SINUSITIS- ACUTE-NOS 01/08/2009   Past Surgical History:  Procedure Laterality Date  . ABDOMINAL HYSTERECTOMY    . APPENDECTOMY    . BREAST BIOPSY    . BUNIONECTOMY    . CATARACT EXTRACTION     bilateral 11/2009-no longer wears glasses  . ECTOPIC PREGNANCY SURGERY    . OOPHORECTOMY    . right thumb surgery      reports that she has never smoked. She has never used smokeless tobacco. She reports current alcohol use. She reports that she does not use drugs. family history includes Cancer in her maternal aunt and maternal uncle; Diabetes in her brother; Hypertension in her mother; Ovarian cancer in her mother. Allergies  Allergen Reactions  . Nystatin Rash  . Esomeprazole Magnesium     REACTION: Headache  . Fexofenadine    REACTION: Hives  . Sulfonamide Derivatives     Per pt: unknown reaction   Current Outpatient Medications on File Prior to Visit  Medication Sig Dispense Refill  . amLODipine (NORVASC) 5 MG tablet TAKE 1 TABLET BY MOUTH ONCE DAILY 90 tablet 3  . aspirin 81 MG tablet Take 81 mg by mouth daily.      Marland Kitchen estradiol (ESTRACE) 1 MG tablet Take 1 tablet (1 mg total) by mouth daily. 90 tablet 3  . furosemide (LASIX) 40 MG tablet TAKE 1 TABLET BY MOUTH TWICE DAILY WITH  THE  2ND  DOSE  ONLY  AS  NEEDED  FOR  PERSISTANT  SWELLING 180 tablet 1  . Glucosamine-Chondroitin (MOVE FREE PO) Take 1 capsule by mouth daily.    . Multiple Vitamin (MULTIVITAMIN) tablet Take 1 tablet by mouth daily.      Marland Kitchen oxybutynin (DITROPAN) 5 MG tablet TAKE ONE TABLET BY MOUTH TWICE DAILY 180 tablet 2  . pravastatin (PRAVACHOL) 40 MG tablet TAKE 1 TABLET BY MOUTH ONCE DAILY 90 tablet 2  . propranolol ER (INDERAL LA) 80 MG 24 hr capsule TAKE 1 CAPSULE BY MOUTH ONCE DAILY 90 capsule 3  . telmisartan (MICARDIS) 40 MG tablet TAKE 1 TABLET BY MOUTH ONCE DAILY 90 tablet 1  . triamcinolone cream (KENALOG) 0.1 % Apply 1 application topically 2 (two) times daily. 30 g 0  . VITAMIN D, CHOLECALCIFEROL, PO Take by  mouth daily.       No current facility-administered medications on file prior to visit.    Review of Systems  Constitutional: Negative for other unusual diaphoresis or sweats HENT: Negative for ear discharge or swelling Eyes: Negative for other worsening visual disturbances Respiratory: Negative for stridor or other swelling  Gastrointestinal: Negative for worsening distension or other blood Genitourinary: Negative for retention or other urinary change Musculoskeletal: Negative for other MSK pain or swelling Skin: Negative for color change or other new lesions Neurological: Negative for worsening tremors and other numbness  Psychiatric/Behavioral: Negative for worsening agitation or other fatigue All other system neg per  pt    Objective:   Physical Exam BP 124/86   Pulse 78   Temp 98.3 F (36.8 C) (Oral)   Ht 5\' 1"  (1.549 m)   Wt 180 lb (81.6 kg)   SpO2 96%   BMI 34.01 kg/m  VS noted, mild ill Constitutional: Pt appears in NAD HENT: Head: NCAT.  Right Ear: External ear normal.  Left Ear: External ear normal.  Eyes: . Pupils are equal, round, and reactive to light. Conjunctivae and EOM are normal Bilat tm's with mild erythema.  Max sinus areas non tender.  Pharynx with mild erythema, no exudate   Nose: without d/c or deformity Neck: Neck supple. Gross normal ROM Cardiovascular: Normal rate and regular rhythm.   Pulmonary/Chest: Effort normal and breath sounds decreased bilat but without rales or wheezing.  Neurological: Pt is alert. At baseline orientation, motor grossly intact Skin: Skin is warm. No rashes, other new lesions, no LE edema Psychiatric: Pt behavior is normal without agitation  No other exam findings Lab Results  Component Value Date   WBC 6.5 10/22/2017   HGB 13.0 10/22/2017   HCT 38.6 10/22/2017   PLT 267.0 10/22/2017   GLUCOSE 93 10/22/2017   CHOL 170 10/22/2017   TRIG 104.0 10/22/2017   HDL 64.90 10/22/2017   LDLCALC 85 10/22/2017   ALT 20 10/22/2017   AST 18 10/22/2017   NA 140 10/22/2017   K 4.2 10/22/2017   CL 102 10/22/2017   CREATININE 0.95 10/22/2017   BUN 13 10/22/2017   CO2 29 10/22/2017   TSH 3.47 10/22/2017       Assessment & Plan:

## 2018-08-15 ENCOUNTER — Other Ambulatory Visit: Payer: Self-pay | Admitting: Internal Medicine

## 2018-09-13 ENCOUNTER — Other Ambulatory Visit: Payer: Self-pay | Admitting: Internal Medicine

## 2018-09-13 DIAGNOSIS — Z1231 Encounter for screening mammogram for malignant neoplasm of breast: Secondary | ICD-10-CM

## 2018-09-16 ENCOUNTER — Telehealth: Payer: Self-pay | Admitting: Internal Medicine

## 2018-09-16 NOTE — Telephone Encounter (Signed)
Patient scheduled.

## 2018-09-16 NOTE — Telephone Encounter (Signed)
Please advise if patient can get a tetanus shot.

## 2018-09-19 ENCOUNTER — Ambulatory Visit (INDEPENDENT_AMBULATORY_CARE_PROVIDER_SITE_OTHER): Payer: PPO

## 2018-09-19 DIAGNOSIS — Z299 Encounter for prophylactic measures, unspecified: Secondary | ICD-10-CM

## 2018-09-19 DIAGNOSIS — Z23 Encounter for immunization: Secondary | ICD-10-CM

## 2018-10-15 ENCOUNTER — Other Ambulatory Visit: Payer: Self-pay

## 2018-10-15 ENCOUNTER — Ambulatory Visit
Admission: RE | Admit: 2018-10-15 | Discharge: 2018-10-15 | Disposition: A | Discharge status: Home / Self Care | Payer: PPO | Source: Ambulatory Visit | Attending: Internal Medicine | Admitting: Internal Medicine

## 2018-10-15 DIAGNOSIS — Z1231 Encounter for screening mammogram for malignant neoplasm of breast: Secondary | ICD-10-CM | POA: Diagnosis not present

## 2018-10-24 ENCOUNTER — Other Ambulatory Visit (INDEPENDENT_AMBULATORY_CARE_PROVIDER_SITE_OTHER): Payer: PPO

## 2018-10-24 DIAGNOSIS — Z0000 Encounter for general adult medical examination without abnormal findings: Secondary | ICD-10-CM

## 2018-10-24 LAB — BASIC METABOLIC PANEL
BUN: 15 mg/dL (ref 6–23)
CO2: 25 mEq/L (ref 19–32)
Calcium: 9.3 mg/dL (ref 8.4–10.5)
Chloride: 104 mEq/L (ref 96–112)
Creatinine, Ser: 0.94 mg/dL (ref 0.40–1.20)
GFR: 70.95 mL/min (ref >60.00)
Glucose, Bld: 87 mg/dL (ref 70–99)
Potassium: 3.9 mEq/L (ref 3.5–5.1)
Sodium: 139 mEq/L (ref 135–145)

## 2018-10-24 LAB — URINALYSIS, ROUTINE W REFLEX MICROSCOPIC
Bilirubin Urine: NEGATIVE
Ketones, ur: NEGATIVE
Leukocytes,Ua: NEGATIVE
Nitrite: NEGATIVE
Specific Gravity, Urine: 1.02 (ref 1.000–1.030)
Total Protein, Urine: NEGATIVE
Urine Glucose: NEGATIVE
Urobilinogen, UA: 0.2 (ref 0.0–1.0)
WBC, UA: NONE SEEN (ref 0–?)
pH: 6 (ref 5.0–8.0)

## 2018-10-24 LAB — HEPATIC FUNCTION PANEL
ALT: 19 U/L (ref 0–35)
AST: 18 U/L (ref 0–37)
Albumin: 4.2 g/dL (ref 3.5–5.2)
Alkaline Phosphatase: 69 U/L (ref 39–117)
Bilirubin, Direct: 0.1 mg/dL (ref 0.0–0.3)
Total Bilirubin: 0.4 mg/dL (ref 0.2–1.2)
Total Protein: 7.2 g/dL (ref 6.0–8.3)

## 2018-10-24 LAB — CBC WITH DIFFERENTIAL/PLATELET
Basophils Absolute: 0.1 10*3/uL (ref 0.0–0.1)
Basophils Relative: 1.7 % (ref 0.0–3.0)
Eosinophils Absolute: 0.2 10*3/uL (ref 0.0–0.7)
Eosinophils Relative: 3.1 % (ref 0.0–5.0)
HCT: 39.1 % (ref 36.0–46.0)
Hemoglobin: 13.1 g/dL (ref 12.0–15.0)
Lymphocytes Relative: 38.1 % (ref 12.0–46.0)
Lymphs Abs: 2.7 10*3/uL (ref 0.7–4.0)
MCHC: 33.5 g/dL (ref 30.0–36.0)
MCV: 90.8 fl (ref 78.0–100.0)
Monocytes Absolute: 0.6 10*3/uL (ref 0.1–1.0)
Monocytes Relative: 9.1 % (ref 3.0–12.0)
Neutro Abs: 3.4 10*3/uL (ref 1.4–7.7)
Neutrophils Relative %: 48 % (ref 43.0–77.0)
Platelets: 257 10*3/uL (ref 150.0–400.0)
RBC: 4.3 Mil/uL (ref 3.87–5.11)
RDW: 13.6 % (ref 11.5–15.5)
WBC: 7.1 10*3/uL (ref 4.0–10.5)

## 2018-10-24 LAB — LIPID PANEL
Cholesterol: 165 mg/dL (ref 0–200)
HDL: 59.9 mg/dL (ref >39.00)
LDL Cholesterol: 84 mg/dL (ref 0–99)
NonHDL: 105.25
Total CHOL/HDL Ratio: 3
Triglycerides: 104 mg/dL (ref 0.0–149.0)
VLDL: 20.8 mg/dL (ref 0.0–40.0)

## 2018-10-24 LAB — TSH: TSH: 3.96 u[IU]/mL (ref 0.35–4.50)

## 2018-10-28 ENCOUNTER — Other Ambulatory Visit: Payer: Self-pay

## 2018-10-28 ENCOUNTER — Ambulatory Visit (INDEPENDENT_AMBULATORY_CARE_PROVIDER_SITE_OTHER): Payer: PPO | Admitting: Internal Medicine

## 2018-10-28 ENCOUNTER — Encounter: Payer: Self-pay | Admitting: Internal Medicine

## 2018-10-28 VITALS — BP 146/98 | HR 74 | Temp 98.0°F | Ht 61.0 in | Wt 179.0 lb

## 2018-10-28 DIAGNOSIS — E538 Deficiency of other specified B group vitamins: Secondary | ICD-10-CM | POA: Diagnosis not present

## 2018-10-28 DIAGNOSIS — E611 Iron deficiency: Secondary | ICD-10-CM

## 2018-10-28 DIAGNOSIS — E559 Vitamin D deficiency, unspecified: Secondary | ICD-10-CM | POA: Diagnosis not present

## 2018-10-28 DIAGNOSIS — Z0000 Encounter for general adult medical examination without abnormal findings: Secondary | ICD-10-CM | POA: Diagnosis not present

## 2018-10-28 MED ORDER — PROPRANOLOL HCL ER 80 MG PO CP24: 80.0000 mg | ORAL_CAPSULE | Freq: Every day | ORAL | 3 refills | Status: DC

## 2018-10-28 MED ORDER — FUROSEMIDE 40 MG PO TABS: ORAL_TABLET | ORAL | 3 refills | Status: DC

## 2018-10-28 MED ORDER — TELMISARTAN 40 MG PO TABS: 40.0000 mg | ORAL_TABLET | Freq: Every day | ORAL | 3 refills | Status: DC

## 2018-10-28 MED ORDER — AMLODIPINE BESYLATE 5 MG PO TABS: 5.0000 mg | ORAL_TABLET | Freq: Every day | ORAL | 3 refills | Status: DC

## 2018-10-28 MED ORDER — OXYBUTYNIN CHLORIDE 5 MG PO TABS: 5.0000 mg | ORAL_TABLET | Freq: Two times a day (BID) | ORAL | 3 refills | Status: DC

## 2018-10-28 MED ORDER — PRAVASTATIN SODIUM 40 MG PO TABS: 40.0000 mg | ORAL_TABLET | Freq: Every day | ORAL | 3 refills | Status: DC

## 2018-10-28 NOTE — Progress Notes (Signed)
Subjective:    Patient ID: Deborah Schultz, female    DOB: 05-Apr-1946, 73 y.o.   MRN: [id number]  HPI  Here for wellness and f/u;  Overall doing ok;  Pt denies Chest pain, worsening SOB, DOE, wheezing, orthopnea, PND, worsening LE edema, palpitations, dizziness or syncope.  Pt denies neurological change such as new headache, facial or extremity weakness.  Pt denies polydipsia, polyuria, or low sugar symptoms. Pt states overall good compliance with treatment and medications, good tolerability, and has been trying to follow appropriate diet.  Pt denies worsening depressive symptoms, suicidal ideation or panic. No fever, night sweats, wt loss, loss of appetite, or other constitutional symptoms.  Pt states good ability with ADL's, has low fall risk, home safety reviewed and adequate, no other significant changes in hearing or vision, and only occasionally active with exercise.  BP at home < 140/90  No new complaints Past Medical History:  Diagnosis Date  . ALOPECIA 06/06/2008  . Anemia    iron deficiency  . ANEMIA-IRON DEFICIENCY 06/06/2008  . BICEPS TENDINITIS, LEFT 05/06/2010  . BRONCHITIS, ACUTE 06/18/2008  . CALLUSES, FEET, BILATERAL 06/06/2008  . Chronic pruritus   . CONSTIPATION 06/06/2008  . Cough 06/18/2008  . Degenerative disc disease, cervical   . Diverticulitis   . DIVERTICULITIS, HX OF 06/06/2008  . ELBOW PAIN 05/26/2010  . FATIGUE 06/18/2008  . HLD (hyperlipidemia)   . HTN (hypertension)   . HYPERLIPIDEMIA 06/06/2008  . OAB (overactive bladder)   . OVERACTIVE BLADDER 06/06/2008  . PERIPHERAL EDEMA 02/19/2009  . Peripheral edema 02/26/2011  . Right carpal tunnel syndrome   . SINUSITIS- ACUTE-NOS 01/08/2009   Past Surgical History:  Procedure Laterality Date  . ABDOMINAL HYSTERECTOMY    . APPENDECTOMY    . BREAST BIOPSY    . BUNIONECTOMY    . CATARACT EXTRACTION     bilateral 11/2009-no longer wears glasses  . ECTOPIC PREGNANCY SURGERY    . OOPHORECTOMY    . right thumb surgery       reports that she has never smoked. She has never used smokeless tobacco. She reports current alcohol use. She reports that she does not use drugs. family history includes Cancer in her maternal aunt and maternal uncle; Diabetes in her brother; Hypertension in her mother; Ovarian cancer in her mother. Allergies  Allergen Reactions  . Nystatin Rash  . Esomeprazole Magnesium     REACTION: Headache  . Fexofenadine     REACTION: Hives  . Sulfonamide Derivatives     Per pt: unknown reaction   Current Outpatient Medications on File Prior to Visit  Medication Sig Dispense Refill  . aspirin 81 MG tablet Take 81 mg by mouth daily.      Marland Kitchen estradiol (ESTRACE) 1 MG tablet Take 1 tablet (1 mg total) by mouth daily. 90 tablet 3  . Glucosamine-Chondroitin (MOVE FREE PO) Take 1 capsule by mouth daily.    . Multiple Vitamin (MULTIVITAMIN) tablet Take 1 tablet by mouth daily.      Marland Kitchen triamcinolone cream (KENALOG) 0.1 % Apply 1 application topically 2 (two) times daily. 30 g 0  . VITAMIN D, CHOLECALCIFEROL, PO Take by mouth daily.       No current facility-administered medications on file prior to visit.    Review of Systems Constitutional: Negative for other unusual diaphoresis, sweats, appetite or weight changes HENT: Negative for other worsening hearing loss, ear pain, facial swelling, mouth sores or neck stiffness.   Eyes: Negative for other worsening  pain, redness or other visual disturbance.  Respiratory: Negative for other stridor or swelling Cardiovascular: Negative for other palpitations or other chest pain  Gastrointestinal: Negative for worsening diarrhea or loose stools, blood in stool, distention or other pain Genitourinary: Negative for hematuria, flank pain or other change in urine volume.  Musculoskeletal: Negative for myalgias or other joint swelling.  Skin: Negative for other color change, or other wound or worsening drainage.  Neurological: Negative for other syncope or numbness.  Hematological: Negative for other adenopathy or swelling Psychiatric/Behavioral: Negative for hallucinations, other worsening agitation, SI, self-injury, or new decreased concentration All other system neg per pt    Objective:   Physical Exam BP (!) 146/98   Pulse 74   Temp 98 F (36.7 C) (Oral)   Ht 5\' 1"  (1.549 m)   Wt 179 lb (81.2 kg)   SpO2 97%   BMI 33.82 kg/m  VS noted,  Constitutional: Pt is oriented to person, place, and time. Appears well-developed and well-nourished, in no significant distress and comfortable Head: Normocephalic and atraumatic  Eyes: Conjunctivae and EOM are normal. Pupils are equal, round, and reactive to light Right Ear: External ear normal without discharge Left Ear: External ear normal without discharge Nose: Nose without discharge or deformity Mouth/Throat: Oropharynx is without other ulcerations and moist  Neck: Normal range of motion. Neck supple. No JVD present. No tracheal deviation present or significant neck LA or mass Cardiovascular: Normal rate, regular rhythm, normal heart sounds and intact distal pulses.   Pulmonary/Chest: WOB normal and breath sounds without rales or wheezing  Abdominal: Soft. Bowel sounds are normal. NT. No HSM  Musculoskeletal: Normal range of motion. Exhibits no edema Lymphadenopathy: Has no other cervical adenopathy.  Neurological: Pt is alert and oriented to person, place, and time. Pt has normal reflexes. No cranial nerve deficit. Motor grossly intact, Gait intact Skin: Skin is warm and dry. No rash noted or new ulcerations Psychiatric:  Has normal mood and affect. Behavior is normal without agitation No other exam findings Lab Results  Component Value Date   WBC 7.1 10/24/2018   HGB 13.1 10/24/2018   HCT 39.1 10/24/2018   PLT 257.0 10/24/2018   GLUCOSE 87 10/24/2018   CHOL 165 10/24/2018   TRIG 104.0 10/24/2018   HDL 59.90 10/24/2018   LDLCALC 84 10/24/2018   ALT 19 10/24/2018   AST 18 10/24/2018   NA 139  10/24/2018   K 3.9 10/24/2018   CL 104 10/24/2018   CREATININE 0.94 10/24/2018   BUN 15 10/24/2018   CO2 25 10/24/2018   TSH 3.96 10/24/2018      Assessment & Plan:

## 2018-10-28 NOTE — Patient Instructions (Signed)
Please continue all other medications as before, and refills have been done if requested.  Please have the pharmacy call with any other refills you may need.  Please continue your efforts at being more active, low cholesterol diet, and weight control.  You are otherwise up to date with prevention measures today.  Please keep your appointments with your specialists as you may have planned  Please return in 1 year for your yearly visit, or sooner if needed, with Lab testing done 3-5 days before  

## 2018-10-29 ENCOUNTER — Encounter: Payer: Self-pay | Admitting: Internal Medicine

## 2018-10-29 NOTE — Assessment & Plan Note (Signed)

## 2018-11-24 DIAGNOSIS — Z124 Encounter for screening for malignant neoplasm of cervix: Secondary | ICD-10-CM | POA: Diagnosis not present

## 2018-11-24 DIAGNOSIS — Z90710 Acquired absence of both cervix and uterus: Secondary | ICD-10-CM | POA: Diagnosis not present

## 2018-11-24 DIAGNOSIS — Z1272 Encounter for screening for malignant neoplasm of vagina: Secondary | ICD-10-CM | POA: Diagnosis not present

## 2018-11-24 DIAGNOSIS — Z6834 Body mass index (BMI) 34.0-34.9, adult: Secondary | ICD-10-CM | POA: Diagnosis not present

## 2018-12-23 ENCOUNTER — Ambulatory Visit (INDEPENDENT_AMBULATORY_CARE_PROVIDER_SITE_OTHER): Payer: PPO

## 2018-12-23 ENCOUNTER — Other Ambulatory Visit: Payer: Self-pay

## 2018-12-23 DIAGNOSIS — Z23 Encounter for immunization: Secondary | ICD-10-CM | POA: Diagnosis not present

## 2019-03-14 ENCOUNTER — Other Ambulatory Visit: Payer: Self-pay

## 2019-05-30 DIAGNOSIS — Z961 Presence of intraocular lens: Secondary | ICD-10-CM | POA: Diagnosis not present

## 2019-05-30 DIAGNOSIS — I10 Essential (primary) hypertension: Secondary | ICD-10-CM | POA: Diagnosis not present

## 2019-05-30 DIAGNOSIS — H524 Presbyopia: Secondary | ICD-10-CM | POA: Diagnosis not present

## 2019-07-24 DIAGNOSIS — L209 Atopic dermatitis, unspecified: Secondary | ICD-10-CM | POA: Diagnosis not present

## 2019-07-24 DIAGNOSIS — L853 Xerosis cutis: Secondary | ICD-10-CM | POA: Diagnosis not present

## 2019-09-04 ENCOUNTER — Other Ambulatory Visit: Payer: Self-pay | Admitting: Internal Medicine

## 2019-09-04 DIAGNOSIS — Z1231 Encounter for screening mammogram for malignant neoplasm of breast: Secondary | ICD-10-CM

## 2019-09-06 ENCOUNTER — Encounter: Payer: Self-pay | Admitting: Internal Medicine

## 2019-10-16 ENCOUNTER — Other Ambulatory Visit: Payer: Self-pay

## 2019-10-16 ENCOUNTER — Ambulatory Visit
Admission: RE | Admit: 2019-10-16 | Discharge: 2019-10-16 | Disposition: A | Discharge status: Home / Self Care | Payer: PPO | Source: Ambulatory Visit | Attending: Internal Medicine | Admitting: Internal Medicine

## 2019-10-16 DIAGNOSIS — Z1231 Encounter for screening mammogram for malignant neoplasm of breast: Secondary | ICD-10-CM | POA: Diagnosis not present

## 2019-10-20 ENCOUNTER — Encounter: Payer: Self-pay | Admitting: Internal Medicine

## 2019-10-30 ENCOUNTER — Other Ambulatory Visit (INDEPENDENT_AMBULATORY_CARE_PROVIDER_SITE_OTHER): Payer: PPO

## 2019-10-30 DIAGNOSIS — Z0000 Encounter for general adult medical examination without abnormal findings: Secondary | ICD-10-CM

## 2019-10-30 DIAGNOSIS — E559 Vitamin D deficiency, unspecified: Secondary | ICD-10-CM | POA: Diagnosis not present

## 2019-10-30 DIAGNOSIS — E538 Deficiency of other specified B group vitamins: Secondary | ICD-10-CM | POA: Diagnosis not present

## 2019-10-30 LAB — HEPATIC FUNCTION PANEL
ALT: 16 U/L (ref 0–35)
AST: 15 U/L (ref 0–37)
Albumin: 4.1 g/dL (ref 3.5–5.2)
Alkaline Phosphatase: 63 U/L (ref 39–117)
Bilirubin, Direct: 0.1 mg/dL (ref 0.0–0.3)
Total Bilirubin: 0.5 mg/dL (ref 0.2–1.2)
Total Protein: 6.8 g/dL (ref 6.0–8.3)

## 2019-10-30 LAB — URINALYSIS, ROUTINE W REFLEX MICROSCOPIC
Bilirubin Urine: NEGATIVE
Hgb urine dipstick: NEGATIVE
Ketones, ur: NEGATIVE
Leukocytes,Ua: NEGATIVE
Nitrite: NEGATIVE
RBC / HPF: NONE SEEN (ref 0–?)
Specific Gravity, Urine: 1.015 (ref 1.000–1.030)
Total Protein, Urine: NEGATIVE
Urine Glucose: NEGATIVE
Urobilinogen, UA: 0.2 (ref 0.0–1.0)
pH: 6.5 (ref 5.0–8.0)

## 2019-10-30 LAB — CBC WITH DIFFERENTIAL/PLATELET
Basophils Absolute: 0 10*3/uL (ref 0.0–0.1)
Basophils Relative: 0.6 % (ref 0.0–3.0)
Eosinophils Absolute: 0.2 10*3/uL (ref 0.0–0.7)
Eosinophils Relative: 2.9 % (ref 0.0–5.0)
HCT: 38.1 % (ref 36.0–46.0)
Hemoglobin: 13.1 g/dL (ref 12.0–15.0)
Lymphocytes Relative: 36.5 % (ref 12.0–46.0)
Lymphs Abs: 2.7 10*3/uL (ref 0.7–4.0)
MCHC: 34.4 g/dL (ref 30.0–36.0)
MCV: 89.7 fl (ref 78.0–100.0)
Monocytes Absolute: 0.6 10*3/uL (ref 0.1–1.0)
Monocytes Relative: 8.5 % (ref 3.0–12.0)
Neutro Abs: 3.8 10*3/uL (ref 1.4–7.7)
Neutrophils Relative %: 51.5 % (ref 43.0–77.0)
Platelets: 256 10*3/uL (ref 150.0–400.0)
RBC: 4.24 Mil/uL (ref 3.87–5.11)
RDW: 14 % (ref 11.5–15.5)
WBC: 7.4 10*3/uL (ref 4.0–10.5)

## 2019-10-30 LAB — TSH: TSH: 3.79 u[IU]/mL (ref 0.35–4.50)

## 2019-10-30 LAB — BASIC METABOLIC PANEL
BUN: 16 mg/dL (ref 6–23)
CO2: 25 mEq/L (ref 19–32)
Calcium: 9.3 mg/dL (ref 8.4–10.5)
Chloride: 104 mEq/L (ref 96–112)
Creatinine, Ser: 0.94 mg/dL (ref 0.40–1.20)
GFR: 70.75 mL/min (ref >60.00)
Glucose, Bld: 91 mg/dL (ref 70–99)
Potassium: 3.7 mEq/L (ref 3.5–5.1)
Sodium: 138 mEq/L (ref 135–145)

## 2019-10-30 LAB — VITAMIN B12: Vitamin B-12: 1088 pg/mL — ABNORMAL HIGH (ref 211–911)

## 2019-10-30 LAB — LIPID PANEL
Cholesterol: 168 mg/dL (ref 0–200)
HDL: 59.2 mg/dL (ref >39.00)
LDL Cholesterol: 82 mg/dL (ref 0–99)
NonHDL: 108.7
Total CHOL/HDL Ratio: 3
Triglycerides: 133 mg/dL (ref 0.0–149.0)
VLDL: 26.6 mg/dL (ref 0.0–40.0)

## 2019-10-30 LAB — VITAMIN D 25 HYDROXY (VIT D DEFICIENCY, FRACTURES): VITD: 57.25 ng/mL (ref 30.00–100.00)

## 2019-10-31 ENCOUNTER — Encounter: Payer: Self-pay | Admitting: Internal Medicine

## 2019-10-31 ENCOUNTER — Ambulatory Visit (INDEPENDENT_AMBULATORY_CARE_PROVIDER_SITE_OTHER): Payer: PPO | Admitting: Internal Medicine

## 2019-10-31 ENCOUNTER — Other Ambulatory Visit: Payer: Self-pay

## 2019-10-31 VITALS — BP 130/80 | HR 62 | Temp 98.8°F | Ht 61.0 in | Wt 178.0 lb

## 2019-10-31 DIAGNOSIS — Z0000 Encounter for general adult medical examination without abnormal findings: Secondary | ICD-10-CM

## 2019-10-31 MED ORDER — AMLODIPINE BESYLATE 5 MG PO TABS: 5.0000 mg | ORAL_TABLET | Freq: Every day | ORAL | 3 refills | Status: DC

## 2019-10-31 MED ORDER — PRAVASTATIN SODIUM 40 MG PO TABS: 40.0000 mg | ORAL_TABLET | Freq: Every day | ORAL | 3 refills | Status: DC

## 2019-10-31 MED ORDER — PROPRANOLOL HCL ER 80 MG PO CP24: 80.0000 mg | ORAL_CAPSULE | Freq: Every day | ORAL | 3 refills | Status: DC

## 2019-10-31 MED ORDER — ESTRADIOL 1 MG PO TABS: 1.0000 mg | ORAL_TABLET | Freq: Every day | ORAL | 3 refills | Status: DC

## 2019-10-31 MED ORDER — FUROSEMIDE 40 MG PO TABS: ORAL_TABLET | ORAL | 3 refills | Status: DC

## 2019-10-31 MED ORDER — TELMISARTAN 40 MG PO TABS: 40.0000 mg | ORAL_TABLET | Freq: Every day | ORAL | 3 refills | Status: DC

## 2019-10-31 MED ORDER — OXYBUTYNIN CHLORIDE 5 MG PO TABS: 5.0000 mg | ORAL_TABLET | Freq: Two times a day (BID) | ORAL | 3 refills | Status: DC

## 2019-10-31 NOTE — Progress Notes (Signed)
Subjective:    Patient ID: Deborah Schultz, female    DOB: 03-Sep-1945, 74 y.o.   MRN: [id number]  HPI  Here for wellness and f/u;  Overall doing ok;  Pt denies Chest pain, worsening SOB, DOE, wheezing, orthopnea, PND, worsening LE edema, palpitations, dizziness or syncope.  Pt denies neurological change such as new headache, facial or extremity weakness.  Pt denies polydipsia, polyuria, or low sugar symptoms. Pt states overall good compliance with treatment and medications, good tolerability, and has been trying to follow appropriate diet.  Pt denies worsening depressive symptoms, suicidal ideation or panic. No fever, night sweats, wt loss, loss of appetite, or other constitutional symptoms.  Pt states good ability with ADL's, has low fall risk, home safety reviewed and adequate, no other significant changes in hearing or vision, and only occasionally active with exercise. No new complaints Past Medical History:  Diagnosis Date  . ALOPECIA 06/06/2008  . Anemia    iron deficiency  . ANEMIA-IRON DEFICIENCY 06/06/2008  . BICEPS TENDINITIS, LEFT 05/06/2010  . BRONCHITIS, ACUTE 06/18/2008  . CALLUSES, FEET, BILATERAL 06/06/2008  . Chronic pruritus   . CONSTIPATION 06/06/2008  . Cough 06/18/2008  . Degenerative disc disease, cervical   . Diverticulitis   . DIVERTICULITIS, HX OF 06/06/2008  . ELBOW PAIN 05/26/2010  . FATIGUE 06/18/2008  . HLD (hyperlipidemia)   . HTN (hypertension)   . HYPERLIPIDEMIA 06/06/2008  . OAB (overactive bladder)   . OVERACTIVE BLADDER 06/06/2008  . PERIPHERAL EDEMA 02/19/2009  . Peripheral edema 02/26/2011  . Right carpal tunnel syndrome   . SINUSITIS- ACUTE-NOS 01/08/2009   Past Surgical History:  Procedure Laterality Date  . ABDOMINAL HYSTERECTOMY    . APPENDECTOMY    . BREAST BIOPSY    . BUNIONECTOMY    . CATARACT EXTRACTION     bilateral 11/2009-no longer wears glasses  . ECTOPIC PREGNANCY SURGERY    . OOPHORECTOMY    . right thumb surgery      reports that  she has never smoked. She has never used smokeless tobacco. She reports current alcohol use. She reports that she does not use drugs. family history includes Cancer in her maternal aunt and maternal uncle; Diabetes in her brother; Hypertension in her mother; Ovarian cancer in her mother. Allergies  Allergen Reactions  . Nystatin Rash  . Sulfa Antibiotics Anaphylaxis    Per pt: unknown reaction  . Esomeprazole Magnesium     REACTION: Headache  . Fexofenadine     REACTION: Hives  . Sulfonamide Derivatives     Per pt: unknown reaction   Current Outpatient Medications on File Prior to Visit  Medication Sig Dispense Refill  . aspirin 81 MG tablet Take 81 mg by mouth daily.      . Multiple Vitamin (MULTIVITAMIN) tablet Take 1 tablet by mouth daily.      Marland Kitchen triamcinolone cream (KENALOG) 0.1 % Apply 1 application topically 2 (two) times daily. 30 g 0  . VITAMIN D, CHOLECALCIFEROL, PO Take by mouth daily.      . Cholecalciferol (VITAMIN D3) 100000 UNIT/GM POWD Take by mouth.     No current facility-administered medications on file prior to visit.   Review of Systems All otherwise neg per pt     Objective:   Physical Exam BP 130/80 (BP Location: Left Arm, Patient Position: Sitting, Cuff Size: Large)   Pulse 62   Temp 98.8 F (37.1 C) (Oral)   Ht 5\' 1"  (1.549 m)   Wt 178 lb (  80.7 kg)   SpO2 97%   BMI 33.63 kg/m  VS noted,  Constitutional: Pt appears in NAD HENT: Head: NCAT.  Right Ear: External ear normal.  Left Ear: External ear normal.  Eyes: . Pupils are equal, round, and reactive to light. Conjunctivae and EOM are normal Nose: without d/c or deformity Neck: Neck supple. Gross normal ROM Cardiovascular: Normal rate and regular rhythm.   Pulmonary/Chest: Effort normal and breath sounds without rales or wheezing.  Abd:  Soft, NT, ND, + BS, no organomegaly Neurological: Pt is alert. At baseline orientation, motor grossly intact Skin: Skin is warm. No rashes, other new lesions,  no LE edema Psychiatric: Pt behavior is normal without agitation  Lab Results  Component Value Date   WBC 7.4 10/30/2019   HGB 13.1 10/30/2019   HCT 38.1 10/30/2019   PLT 256.0 10/30/2019   GLUCOSE 91 10/30/2019   CHOL 168 10/30/2019   TRIG 133.0 10/30/2019   HDL 59.20 10/30/2019   LDLCALC 82 10/30/2019   ALT 16 10/30/2019   AST 15 10/30/2019   NA 138 10/30/2019   K 3.7 10/30/2019   CL 104 10/30/2019   CREATININE 0.94 10/30/2019   BUN 16 10/30/2019   CO2 25 10/30/2019   TSH 3.79 10/30/2019          Assessment & Plan:

## 2019-10-31 NOTE — Assessment & Plan Note (Signed)

## 2019-10-31 NOTE — Patient Instructions (Signed)

## 2019-11-27 DIAGNOSIS — Z01419 Encounter for gynecological examination (general) (routine) without abnormal findings: Secondary | ICD-10-CM | POA: Diagnosis not present

## 2019-11-27 DIAGNOSIS — Z6834 Body mass index (BMI) 34.0-34.9, adult: Secondary | ICD-10-CM | POA: Diagnosis not present

## 2020-01-10 DIAGNOSIS — M21611 Bunion of right foot: Secondary | ICD-10-CM | POA: Diagnosis not present

## 2020-01-10 DIAGNOSIS — L602 Onychogryphosis: Secondary | ICD-10-CM | POA: Diagnosis not present

## 2020-01-10 DIAGNOSIS — B351 Tinea unguium: Secondary | ICD-10-CM | POA: Diagnosis not present

## 2020-01-10 DIAGNOSIS — B353 Tinea pedis: Secondary | ICD-10-CM | POA: Diagnosis not present

## 2020-01-12 DIAGNOSIS — L720 Epidermal cyst: Secondary | ICD-10-CM | POA: Diagnosis not present

## 2020-01-12 DIAGNOSIS — L819 Disorder of pigmentation, unspecified: Secondary | ICD-10-CM | POA: Diagnosis not present

## 2020-01-22 DIAGNOSIS — B351 Tinea unguium: Secondary | ICD-10-CM | POA: Diagnosis not present

## 2020-01-23 ENCOUNTER — Ambulatory Visit: Payer: PPO

## 2020-01-31 DIAGNOSIS — L602 Onychogryphosis: Secondary | ICD-10-CM | POA: Diagnosis not present

## 2020-01-31 DIAGNOSIS — M21611 Bunion of right foot: Secondary | ICD-10-CM | POA: Diagnosis not present

## 2020-01-31 DIAGNOSIS — B353 Tinea pedis: Secondary | ICD-10-CM | POA: Diagnosis not present

## 2020-03-02 ENCOUNTER — Ambulatory Visit (INDEPENDENT_AMBULATORY_CARE_PROVIDER_SITE_OTHER): Payer: PPO

## 2020-03-02 DIAGNOSIS — Z23 Encounter for immunization: Secondary | ICD-10-CM | POA: Diagnosis not present

## 2020-04-22 ENCOUNTER — Encounter: Payer: Self-pay | Admitting: Internal Medicine

## 2020-07-23 ENCOUNTER — Ambulatory Visit: Payer: PPO | Admitting: Family Medicine

## 2020-07-23 ENCOUNTER — Telehealth: Payer: Self-pay | Admitting: Internal Medicine

## 2020-07-23 NOTE — Telephone Encounter (Signed)
LVM for pt to rtn my call to schedule AWV with [facility]. Please schedule AWV if pt calls the office  

## 2020-07-25 DIAGNOSIS — M25561 Pain in right knee: Secondary | ICD-10-CM | POA: Diagnosis not present

## 2020-09-03 ENCOUNTER — Other Ambulatory Visit: Payer: Self-pay | Admitting: Internal Medicine

## 2020-09-03 DIAGNOSIS — Z1231 Encounter for screening mammogram for malignant neoplasm of breast: Secondary | ICD-10-CM

## 2020-10-25 ENCOUNTER — Other Ambulatory Visit (INDEPENDENT_AMBULATORY_CARE_PROVIDER_SITE_OTHER): Payer: PPO

## 2020-10-25 DIAGNOSIS — Z0000 Encounter for general adult medical examination without abnormal findings: Secondary | ICD-10-CM

## 2020-10-25 LAB — CBC WITH DIFFERENTIAL/PLATELET
Basophils Absolute: 0 10*3/uL (ref 0.0–0.1)
Basophils Relative: 0.6 % (ref 0.0–3.0)
Eosinophils Absolute: 0.2 10*3/uL (ref 0.0–0.7)
Eosinophils Relative: 2.9 % (ref 0.0–5.0)
HCT: 39.2 % (ref 36.0–46.0)
Hemoglobin: 13.1 g/dL (ref 12.0–15.0)
Lymphocytes Relative: 36.8 % (ref 12.0–46.0)
Lymphs Abs: 2.7 10*3/uL (ref 0.7–4.0)
MCHC: 33.3 g/dL (ref 30.0–36.0)
MCV: 90.9 fl (ref 78.0–100.0)
Monocytes Absolute: 0.9 10*3/uL (ref 0.1–1.0)
Monocytes Relative: 11.9 % (ref 3.0–12.0)
Neutro Abs: 3.5 10*3/uL (ref 1.4–7.7)
Neutrophils Relative %: 47.8 % (ref 43.0–77.0)
Platelets: 250 10*3/uL (ref 150.0–400.0)
RBC: 4.31 Mil/uL (ref 3.87–5.11)
RDW: 14.1 % (ref 11.5–15.5)
WBC: 7.3 10*3/uL (ref 4.0–10.5)

## 2020-10-25 LAB — LIPID PANEL
Cholesterol: 192 mg/dL (ref 0–200)
HDL: 67.9 mg/dL (ref >39.00)
LDL Cholesterol: 95 mg/dL (ref 0–99)
NonHDL: 123.87
Total CHOL/HDL Ratio: 3
Triglycerides: 146 mg/dL (ref 0.0–149.0)
VLDL: 29.2 mg/dL (ref 0.0–40.0)

## 2020-10-25 LAB — HEPATIC FUNCTION PANEL
ALT: 26 U/L (ref 0–35)
AST: 26 U/L (ref 0–37)
Albumin: 4.3 g/dL (ref 3.5–5.2)
Alkaline Phosphatase: 72 U/L (ref 39–117)
Bilirubin, Direct: 0.1 mg/dL (ref 0.0–0.3)
Total Bilirubin: 0.4 mg/dL (ref 0.2–1.2)
Total Protein: 7.3 g/dL (ref 6.0–8.3)

## 2020-10-25 LAB — URINALYSIS, ROUTINE W REFLEX MICROSCOPIC
Bilirubin Urine: NEGATIVE
Hgb urine dipstick: NEGATIVE
Ketones, ur: NEGATIVE
Leukocytes,Ua: NEGATIVE
Nitrite: NEGATIVE
Specific Gravity, Urine: <=1.005 — AB (ref 1.000–1.030)
Total Protein, Urine: NEGATIVE
Urine Glucose: NEGATIVE
Urobilinogen, UA: 0.2 (ref 0.0–1.0)
pH: 6 (ref 5.0–8.0)

## 2020-10-25 LAB — TSH: TSH: 3.07 u[IU]/mL (ref 0.35–5.50)

## 2020-10-25 LAB — BASIC METABOLIC PANEL
BUN: 12 mg/dL (ref 6–23)
CO2: 29 mEq/L (ref 19–32)
Calcium: 9.7 mg/dL (ref 8.4–10.5)
Chloride: 100 mEq/L (ref 96–112)
Creatinine, Ser: 0.98 mg/dL (ref 0.40–1.20)
GFR: 57.31 mL/min — ABNORMAL LOW (ref >60.00)
Glucose, Bld: 74 mg/dL (ref 70–99)
Potassium: 4.5 mEq/L (ref 3.5–5.1)
Sodium: 137 mEq/L (ref 135–145)

## 2020-10-25 NOTE — Addendum Note (Signed)
Addended by: Miguel Aschoff on: 10/25/2020 12:46 PM   Modules accepted: Orders

## 2020-10-30 ENCOUNTER — Other Ambulatory Visit: Payer: Self-pay

## 2020-10-31 ENCOUNTER — Ambulatory Visit: Payer: PPO | Admitting: Internal Medicine

## 2020-10-31 ENCOUNTER — Other Ambulatory Visit: Payer: Self-pay

## 2020-10-31 VITALS — BP 146/80 | HR 76 | Temp 100.3°F | Ht 61.0 in | Wt 176.0 lb

## 2020-11-01 ENCOUNTER — Telehealth (INDEPENDENT_AMBULATORY_CARE_PROVIDER_SITE_OTHER): Payer: PPO | Admitting: Family Medicine

## 2020-11-01 ENCOUNTER — Inpatient Hospital Stay: Admission: RE | Admit: 2020-11-01 | Payer: PPO | Source: Ambulatory Visit

## 2020-11-01 DIAGNOSIS — U071 COVID-19: Secondary | ICD-10-CM

## 2020-11-01 MED ORDER — MOLNUPIRAVIR EUA 200MG CAPSULE: 4.0000 | ORAL_CAPSULE | Freq: Two times a day (BID) | ORAL | 0 refills | Status: AC

## 2020-11-01 NOTE — Progress Notes (Signed)
Patient ID: Deborah Schultz, female   DOB: Mar 16, 1946, 75 y.o.   MRN: [id number]  This visit type was conducted due to national recommendations for restrictions regarding the COVID-19 pandemic in an effort to limit this patient's exposure and mitigate transmission in our community.   Virtual Visit via Video Note  I connected with Deborah Schultz on 11/01/20 at 10:15 AM EDT by a video enabled telemedicine application and verified that I am speaking with the correct person using two identifiers.  Location patient: home Location provider:work or home office Persons participating in the virtual visit: patient, provider  I discussed the limitations of evaluation and management by telemedicine and the availability of in person appointments. The patient expressed understanding and agreed to proceed.   HPI:  Deborah Schultz developed fever up to 103 yesterday along with some cough and nasal congestion.  By last night her temperature did come down to 100 even.  She still has some nasal congestion and cough today and has not taken her temperature.  She denies any nausea or vomiting.  No dyspnea.  Mild malaise.  She has history of hypertension.  No history of diabetes or any chronic heart or lung problems.  She took rapid COVID home test yesterday which came back positive.  Husband is asymptomatic thus far.  She has been vaccinated with [employer] vaccine.   ROS: See pertinent positives and negatives per HPI.  Past Medical History:  Diagnosis Date   ALOPECIA 06/06/2008   Anemia    iron deficiency   ANEMIA-IRON DEFICIENCY 06/06/2008   BICEPS TENDINITIS, LEFT 05/06/2010   BRONCHITIS, ACUTE 06/18/2008   CALLUSES, FEET, BILATERAL 06/06/2008   Chronic pruritus    CONSTIPATION 06/06/2008   Cough 06/18/2008   Degenerative disc disease, cervical    Diverticulitis    DIVERTICULITIS, HX OF 06/06/2008   ELBOW PAIN 05/26/2010   FATIGUE 06/18/2008   HLD (hyperlipidemia)    HTN (hypertension)    HYPERLIPIDEMIA  06/06/2008   OAB (overactive bladder)    OVERACTIVE BLADDER 06/06/2008   PERIPHERAL EDEMA 02/19/2009   Peripheral edema 02/26/2011   Right carpal tunnel syndrome    SINUSITIS- ACUTE-NOS 01/08/2009    Past Surgical History:  Procedure Laterality Date   ABDOMINAL HYSTERECTOMY     APPENDECTOMY     BREAST BIOPSY     BUNIONECTOMY     CATARACT EXTRACTION     bilateral 11/2009-no longer wears glasses   ECTOPIC PREGNANCY SURGERY     OOPHORECTOMY     right thumb surgery      Family History  Problem Relation Age of Onset   Hypertension Mother    Ovarian cancer Mother    Diabetes Brother    Cancer Maternal Aunt        unspecif if mat or pat   Cancer Maternal Uncle        unspecif if mat or pat   Colon cancer Neg Hx     SOCIAL HX: Non-smoker lives at home with husband.   Current Outpatient Medications:    molnupiravir EUA 200 mg CAPS, Take 4 capsules (800 mg total) by mouth 2 (two) times daily for 5 days., Disp: 40 capsule, Rfl: 0   amLODipine (NORVASC) 5 MG tablet, Take 1 tablet (5 mg total) by mouth daily., Disp: 90 tablet, Rfl: 3   aspirin 81 MG tablet, Take 81 mg by mouth daily.  , Disp: , Rfl:    Cholecalciferol (VITAMIN D3) 100000 UNIT/GM POWD, Take by mouth., Disp: , Rfl:  estradiol (ESTRACE) 1 MG tablet, Take 1 tablet (1 mg total) by mouth daily., Disp: 90 tablet, Rfl: 3   furosemide (LASIX) 40 MG tablet, TAKE 1 TABLET BY MOUTH TWICE DAILY WITH  THE  2ND  DOSE  ONLY  AS  NEEDED  FOR  PERSISTANT  SWELLING, Disp: 180 tablet, Rfl: 3   Multiple Vitamin (MULTIVITAMIN) tablet, Take 1 tablet by mouth daily.  , Disp: , Rfl:    oxybutynin (DITROPAN) 5 MG tablet, Take 1 tablet (5 mg total) by mouth 2 (two) times daily., Disp: 180 tablet, Rfl: 3   pravastatin (PRAVACHOL) 40 MG tablet, Take 1 tablet (40 mg total) by mouth daily., Disp: 90 tablet, Rfl: 3   propranolol ER (INDERAL LA) 80 MG 24 hr capsule, Take 1 capsule (80 mg total) by mouth daily., Disp: 90 capsule, Rfl: 3   telmisartan  (MICARDIS) 40 MG tablet, Take 1 tablet (40 mg total) by mouth daily., Disp: 90 tablet, Rfl: 3   triamcinolone cream (KENALOG) 0.1 %, Apply 1 application topically 2 (two) times daily., Disp: 30 g, Rfl: 0   VITAMIN D, CHOLECALCIFEROL, PO, Take by mouth daily.  , Disp: , Rfl:   EXAM:  VITALS per patient if applicable:  GENERAL: alert, oriented, appears well and in no acute distress  HEENT: atraumatic, conjunttiva clear, no obvious abnormalities on inspection of external nose and ears  NECK: normal movements of the head and neck  LUNGS: on inspection no signs of respiratory distress, breathing rate appears normal, no obvious gross SOB, gasping or wheezing  CV: no obvious cyanosis  MS: moves all visible extremities without noticeable abnormality  PSYCH/NEURO: pleasant and cooperative, no obvious depression or anxiety, speech and thought processing grossly intact  ASSESSMENT AND PLAN:  Discussed the following assessment and plan:  COVID-19 infection.  Patient has underlying hypertension and is 75 years of age.  -We discussed antiviral therapy especially with her age and underlying risk factors even though she is doing reasonly well at this time. -We discussed starting Molnupiravir 4 capsules by mouth twice daily for 5 days -Continue plenty of fluids and rest -She is aware of isolation recommendations -Follow-up immediately for increased shortness of breath or other concerns     I discussed the assessment and treatment plan with the patient. The patient was provided an opportunity to ask questions and all were answered. The patient agreed with the plan and demonstrated an understanding of the instructions.   The patient was advised to call back or seek an in-person evaluation if the symptoms worsen or if the condition fails to improve as anticipated.     Evelena Peat, MD

## 2020-11-03 ENCOUNTER — Encounter: Payer: Self-pay | Admitting: Internal Medicine

## 2020-11-03 NOTE — Progress Notes (Signed)
Pt not seen - error 

## 2020-11-03 NOTE — Patient Instructions (Signed)
error 

## 2020-11-08 ENCOUNTER — Encounter: Payer: Self-pay | Admitting: Family Medicine

## 2020-11-15 ENCOUNTER — Encounter: Payer: Self-pay | Admitting: Family Medicine

## 2020-11-18 ENCOUNTER — Encounter: Payer: Self-pay | Admitting: Internal Medicine

## 2020-11-18 NOTE — Telephone Encounter (Signed)
Ok to let pt know  - ok for rov if she is at least 6 days after positive testing and no further symptoms, thanks

## 2020-11-19 ENCOUNTER — Other Ambulatory Visit: Payer: Self-pay | Admitting: Internal Medicine

## 2020-11-19 NOTE — Telephone Encounter (Signed)
Please refill as per office routine med refill policy (all routine meds refilled for 3 mo or monthly per pt preference up to one year from last visit, then month to month grace period for 3 mo, then further med refills will have to be denied)  

## 2020-11-23 ENCOUNTER — Encounter (HOSPITAL_COMMUNITY): Payer: Self-pay

## 2020-11-23 ENCOUNTER — Emergency Department (HOSPITAL_COMMUNITY)
Admission: EM | Admit: 2020-11-23 | Discharge: 2020-11-23 | Disposition: A | Discharge status: Home / Self Care | Payer: PPO | Attending: Emergency Medicine | Admitting: Emergency Medicine

## 2020-11-23 ENCOUNTER — Emergency Department (HOSPITAL_COMMUNITY): Payer: PPO

## 2020-11-23 ENCOUNTER — Other Ambulatory Visit: Payer: Self-pay

## 2020-11-23 DIAGNOSIS — R0689 Other abnormalities of breathing: Secondary | ICD-10-CM | POA: Diagnosis not present

## 2020-11-23 DIAGNOSIS — S60222A Contusion of left hand, initial encounter: Secondary | ICD-10-CM

## 2020-11-23 DIAGNOSIS — M79661 Pain in right lower leg: Secondary | ICD-10-CM | POA: Diagnosis not present

## 2020-11-23 DIAGNOSIS — Z7982 Long term (current) use of aspirin: Secondary | ICD-10-CM | POA: Insufficient documentation

## 2020-11-23 DIAGNOSIS — Y92410 Unspecified street and highway as the place of occurrence of the external cause: Secondary | ICD-10-CM | POA: Insufficient documentation

## 2020-11-23 DIAGNOSIS — I10 Essential (primary) hypertension: Secondary | ICD-10-CM | POA: Diagnosis not present

## 2020-11-23 DIAGNOSIS — R519 Headache, unspecified: Secondary | ICD-10-CM | POA: Diagnosis not present

## 2020-11-23 DIAGNOSIS — S8992XA Unspecified injury of left lower leg, initial encounter: Secondary | ICD-10-CM | POA: Diagnosis present

## 2020-11-23 DIAGNOSIS — M542 Cervicalgia: Secondary | ICD-10-CM | POA: Diagnosis not present

## 2020-11-23 DIAGNOSIS — S0990XA Unspecified injury of head, initial encounter: Secondary | ICD-10-CM | POA: Diagnosis not present

## 2020-11-23 DIAGNOSIS — Z79899 Other long term (current) drug therapy: Secondary | ICD-10-CM | POA: Insufficient documentation

## 2020-11-23 DIAGNOSIS — R609 Edema, unspecified: Secondary | ICD-10-CM | POA: Diagnosis not present

## 2020-11-23 DIAGNOSIS — M79662 Pain in left lower leg: Secondary | ICD-10-CM | POA: Diagnosis not present

## 2020-11-23 DIAGNOSIS — M79641 Pain in right hand: Secondary | ICD-10-CM | POA: Diagnosis not present

## 2020-11-23 DIAGNOSIS — M19042 Primary osteoarthritis, left hand: Secondary | ICD-10-CM | POA: Diagnosis not present

## 2020-11-23 DIAGNOSIS — T148XXA Other injury of unspecified body region, initial encounter: Secondary | ICD-10-CM

## 2020-11-23 MED ORDER — ACETAMINOPHEN 325 MG PO TABS
650.0000 mg | ORAL_TABLET | Freq: Once | ORAL | Status: AC
  Administered 2020-11-23: 650 mg via ORAL
  Filled 2020-11-23: qty 2
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
## 2020-11-23 NOTE — Discharge Instructions (Addendum)
X-rays and CT imaging showed no injuries today.  Recommend ice, Tylenol.  Overall suspect you have bone bruises that will improve over time.

## 2020-11-23 NOTE — ED Notes (Signed)
Ice applied to bilat hands
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
## 2020-11-23 NOTE — ED Notes (Signed)
ED Provider at bedside. 

## 2020-11-23 NOTE — ED Notes (Signed)
Patient transported to CT then x-ray 

## 2020-11-28 ENCOUNTER — Other Ambulatory Visit: Payer: Self-pay | Admitting: Internal Medicine

## 2020-12-02 ENCOUNTER — Encounter: Payer: Self-pay | Admitting: Internal Medicine

## 2020-12-02 ENCOUNTER — Other Ambulatory Visit: Payer: Self-pay

## 2020-12-02 ENCOUNTER — Ambulatory Visit (INDEPENDENT_AMBULATORY_CARE_PROVIDER_SITE_OTHER): Payer: PPO | Admitting: Internal Medicine

## 2020-12-02 DIAGNOSIS — M79642 Pain in left hand: Secondary | ICD-10-CM | POA: Insufficient documentation

## 2020-12-02 DIAGNOSIS — M79604 Pain in right leg: Secondary | ICD-10-CM | POA: Diagnosis not present

## 2020-12-02 DIAGNOSIS — M79605 Pain in left leg: Secondary | ICD-10-CM | POA: Diagnosis not present

## 2020-12-02 DIAGNOSIS — G44319 Acute post-traumatic headache, not intractable: Secondary | ICD-10-CM

## 2020-12-02 DIAGNOSIS — G44309 Post-traumatic headache, unspecified, not intractable: Secondary | ICD-10-CM | POA: Insufficient documentation

## 2020-12-02 MED ORDER — CYCLOBENZAPRINE HCL 5 MG PO TABS: 5.0000 mg | ORAL_TABLET | Freq: Three times a day (TID) | ORAL | 1 refills | Status: DC | PRN

## 2020-12-02 MED ORDER — MELOXICAM 15 MG PO TABS
15.0000 mg | ORAL_TABLET | Freq: Every day | ORAL | 0 refills | Status: DC
Start: 2020-12-02 — End: 2021-11-11

## 2020-12-02 NOTE — Patient Instructions (Addendum)
We have sent in meloxicam to take daily for the next 1-2 weeks which is anti-inflammatory.   We have also sent in flexeril which is a muscle relaxer which you are can as needed up to 3 times a day.

## 2020-12-02 NOTE — Assessment & Plan Note (Signed)
Does still have swelling in the MCP joints 2-5th hands. X-ray from ER reviewed without fracture. Rx meloxicam for pain and inflammation and can use heat or ice also.
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
## 2020-12-02 NOTE — Assessment & Plan Note (Signed)
Rx meloxicam and flexeril. Reviewed imaging from ER and no indication for repeat. Not consistent with DVT and no indication for US of the legs. Can use heat or ice also.
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
## 2020-12-03 ENCOUNTER — Other Ambulatory Visit: Payer: Self-pay | Admitting: Internal Medicine

## 2020-12-03 NOTE — Telephone Encounter (Signed)
Please refill as per office routine med refill policy (all routine meds refilled for 3 mo or monthly per pt preference up to one year from last visit, then month to month grace period for 3 mo, then further med refills will have to be denied)  

## 2020-12-06 ENCOUNTER — Ambulatory Visit: Payer: PPO | Admitting: Internal Medicine

## 2020-12-11 ENCOUNTER — Other Ambulatory Visit: Payer: Self-pay

## 2020-12-11 ENCOUNTER — Encounter: Payer: Self-pay | Admitting: Internal Medicine

## 2020-12-11 ENCOUNTER — Ambulatory Visit (INDEPENDENT_AMBULATORY_CARE_PROVIDER_SITE_OTHER): Payer: PPO | Admitting: Internal Medicine

## 2020-12-11 VITALS — BP 152/82 | HR 87 | Temp 97.9°F | Ht 61.0 in | Wt 176.0 lb

## 2020-12-11 DIAGNOSIS — G44319 Acute post-traumatic headache, not intractable: Secondary | ICD-10-CM

## 2020-12-11 DIAGNOSIS — N289 Disorder of kidney and ureter, unspecified: Secondary | ICD-10-CM | POA: Diagnosis not present

## 2020-12-11 DIAGNOSIS — I10 Essential (primary) hypertension: Secondary | ICD-10-CM | POA: Diagnosis not present

## 2020-12-11 DIAGNOSIS — M7989 Other specified soft tissue disorders: Secondary | ICD-10-CM | POA: Diagnosis not present

## 2020-12-11 DIAGNOSIS — M79662 Pain in left lower leg: Secondary | ICD-10-CM

## 2020-12-11 MED ORDER — BUTALBITAL-APAP-CAFFEINE 50-325-40 MG PO TABS: 1.0000 | ORAL_TABLET | Freq: Four times a day (QID) | ORAL | 0 refills | Status: DC | PRN
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
## 2020-12-11 NOTE — Patient Instructions (Signed)
Please take all new medication as prescribed - the fioricet for HA pain as needed  You will be contacted regarding the referral for: Left leg vein study to see about blood clots  Please continue all other medications as before, and refills have been done if requested.  Please have the pharmacy call with any other refills you may need.  Please keep your appointments with your specialists as you may have planned  Please go to the [facility] sometime later this week or next to re-check the kidney function  Please make an Appointment to return in 1 months, or sooner if needed for your physical

## 2020-12-13 ENCOUNTER — Ambulatory Visit (HOSPITAL_COMMUNITY)
Admission: RE | Admit: 2020-12-13 | Discharge: 2020-12-13 | Disposition: A | Discharge status: Home / Self Care | Payer: PPO | Source: Ambulatory Visit | Attending: Internal Medicine | Admitting: Internal Medicine

## 2020-12-13 ENCOUNTER — Other Ambulatory Visit: Payer: Self-pay

## 2020-12-13 DIAGNOSIS — M79662 Pain in left lower leg: Secondary | ICD-10-CM | POA: Diagnosis not present

## 2020-12-13 DIAGNOSIS — M7989 Other specified soft tissue disorders: Secondary | ICD-10-CM | POA: Diagnosis not present

## 2020-12-14 ENCOUNTER — Encounter: Payer: Self-pay | Admitting: Internal Medicine

## 2020-12-14 DIAGNOSIS — M7989 Other specified soft tissue disorders: Secondary | ICD-10-CM | POA: Insufficient documentation

## 2020-12-14 DIAGNOSIS — M79662 Pain in left lower leg: Secondary | ICD-10-CM | POA: Insufficient documentation

## 2020-12-14 DIAGNOSIS — N289 Disorder of kidney and ureter, unspecified: Secondary | ICD-10-CM | POA: Insufficient documentation

## 2020-12-14 DIAGNOSIS — N183 Chronic kidney disease, stage 3 unspecified: Secondary | ICD-10-CM | POA: Insufficient documentation

## 2020-12-14 NOTE — Assessment & Plan Note (Signed)
BP Readings from Last 3 Encounters:  12/11/20 (!) 152/82  12/02/20 122/90  11/23/20 (!) 168/74   Mild uncontrolled today but has been stable at home per pt, pt to continue medical treatment inderal, norvasc - no chagne today

## 2020-12-14 NOTE — Assessment & Plan Note (Signed)
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

## 2020-12-14 NOTE — Assessment & Plan Note (Signed)
Mild recent, for f/u lab, may need to stop the mobic prn,  to f/u any worsening symptoms or concerns

## 2020-12-14 NOTE — Assessment & Plan Note (Signed)
uncortunately cant r/o dvt - for venous dopplers

## 2020-12-16 ENCOUNTER — Other Ambulatory Visit (INDEPENDENT_AMBULATORY_CARE_PROVIDER_SITE_OTHER): Payer: PPO

## 2020-12-16 ENCOUNTER — Encounter: Payer: Self-pay | Admitting: Internal Medicine

## 2020-12-16 DIAGNOSIS — N289 Disorder of kidney and ureter, unspecified: Secondary | ICD-10-CM | POA: Diagnosis not present

## 2020-12-16 LAB — BASIC METABOLIC PANEL
BUN: 17 mg/dL (ref 6–23)
CO2: 24 mEq/L (ref 19–32)
Calcium: 9.5 mg/dL (ref 8.4–10.5)
Chloride: 103 mEq/L (ref 96–112)
Creatinine, Ser: 0.97 mg/dL (ref 0.40–1.20)
GFR: 57.96 mL/min — ABNORMAL LOW (ref >60.00)
Glucose, Bld: 81 mg/dL (ref 70–99)
Potassium: 3.9 mEq/L (ref 3.5–5.1)
Sodium: 136 mEq/L (ref 135–145)

## 2020-12-26 ENCOUNTER — Other Ambulatory Visit: Payer: Self-pay

## 2020-12-26 ENCOUNTER — Encounter: Payer: Self-pay | Admitting: Internal Medicine

## 2020-12-26 ENCOUNTER — Ambulatory Visit
Admission: RE | Admit: 2020-12-26 | Discharge: 2020-12-26 | Disposition: A | Discharge status: Home / Self Care | Payer: PPO | Source: Ambulatory Visit | Attending: Internal Medicine | Admitting: Internal Medicine

## 2020-12-26 DIAGNOSIS — Z1231 Encounter for screening mammogram for malignant neoplasm of breast: Secondary | ICD-10-CM | POA: Diagnosis not present

## 2020-12-26 NOTE — Telephone Encounter (Signed)
As I had not recommended any time away from work, and pt was never restricted specifically from work or any aspect of work, I have no opinion on whether she is "released"

## 2020-12-30 ENCOUNTER — Encounter: Payer: Self-pay | Admitting: Internal Medicine

## 2020-12-30 ENCOUNTER — Telehealth: Payer: Self-pay | Admitting: Internal Medicine

## 2020-12-30 DIAGNOSIS — M79643 Pain in unspecified hand: Secondary | ICD-10-CM

## 2020-12-30 NOTE — Telephone Encounter (Signed)
Patient calling in to check status of previous MyChart message she sent to provider... please see below  Please contact patient when available [phone]

## 2020-12-30 NOTE — Telephone Encounter (Signed)
Mychart message routed to Dr. Jonny Ruiz

## 2020-12-31 ENCOUNTER — Encounter: Payer: Self-pay | Admitting: Internal Medicine

## 2021-01-01 NOTE — Addendum Note (Signed)
Addended by: Corwin Levins on: 01/01/2021 12:56 PM   Modules accepted: Orders

## 2021-01-06 ENCOUNTER — Ambulatory Visit: Payer: PPO | Admitting: Orthopedic Surgery

## 2021-01-09 ENCOUNTER — Other Ambulatory Visit: Payer: Self-pay

## 2021-01-09 ENCOUNTER — Ambulatory Visit: Payer: PPO | Admitting: Orthopedic Surgery

## 2021-01-09 ENCOUNTER — Encounter: Payer: Self-pay | Admitting: Orthopedic Surgery

## 2021-01-09 DIAGNOSIS — S63659A Sprain of metacarpophalangeal joint of unspecified finger, initial encounter: Secondary | ICD-10-CM | POA: Diagnosis not present

## 2021-01-09 DIAGNOSIS — Z6833 Body mass index (BMI) 33.0-33.9, adult: Secondary | ICD-10-CM | POA: Diagnosis not present

## 2021-01-09 DIAGNOSIS — Z124 Encounter for screening for malignant neoplasm of cervix: Secondary | ICD-10-CM | POA: Diagnosis not present

## 2021-01-09 DIAGNOSIS — Z90710 Acquired absence of both cervix and uterus: Secondary | ICD-10-CM | POA: Diagnosis not present

## 2021-01-09 DIAGNOSIS — Z1272 Encounter for screening for malignant neoplasm of vagina: Secondary | ICD-10-CM | POA: Diagnosis not present
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
## 2021-01-13 ENCOUNTER — Ambulatory Visit (INDEPENDENT_AMBULATORY_CARE_PROVIDER_SITE_OTHER): Payer: PPO | Admitting: Internal Medicine

## 2021-01-13 ENCOUNTER — Encounter: Payer: Self-pay | Admitting: Internal Medicine

## 2021-01-13 ENCOUNTER — Other Ambulatory Visit: Payer: Self-pay

## 2021-01-13 VITALS — BP 136/76 | HR 67 | Resp 18 | Ht 61.0 in | Wt 171.8 lb

## 2021-01-13 DIAGNOSIS — Z0001 Encounter for general adult medical examination with abnormal findings: Secondary | ICD-10-CM | POA: Diagnosis not present

## 2021-01-13 DIAGNOSIS — N1831 Chronic kidney disease, stage 3a: Secondary | ICD-10-CM

## 2021-01-13 DIAGNOSIS — M79642 Pain in left hand: Secondary | ICD-10-CM | POA: Diagnosis not present

## 2021-01-13 DIAGNOSIS — E7800 Pure hypercholesterolemia, unspecified: Secondary | ICD-10-CM

## 2021-01-13 DIAGNOSIS — I10 Essential (primary) hypertension: Secondary | ICD-10-CM | POA: Diagnosis not present

## 2021-01-13 MED ORDER — AMLODIPINE BESYLATE 5 MG PO TABS: 5.0000 mg | ORAL_TABLET | Freq: Every day | ORAL | 0 refills | Status: DC

## 2021-01-13 MED ORDER — PRAVASTATIN SODIUM 40 MG PO TABS: 40.0000 mg | ORAL_TABLET | Freq: Every day | ORAL | 3 refills | Status: DC

## 2021-01-13 MED ORDER — FUROSEMIDE 40 MG PO TABS: ORAL_TABLET | ORAL | 3 refills | Status: DC

## 2021-01-13 MED ORDER — TELMISARTAN 40 MG PO TABS: 40.0000 mg | ORAL_TABLET | Freq: Every day | ORAL | 3 refills | Status: DC

## 2021-01-13 MED ORDER — PROPRANOLOL HCL ER 80 MG PO CP24: 80.0000 mg | ORAL_CAPSULE | Freq: Every day | ORAL | 3 refills | Status: DC

## 2021-01-13 MED ORDER — ESTRADIOL 1 MG PO TABS: 1.0000 mg | ORAL_TABLET | Freq: Every day | ORAL | 3 refills | Status: DC

## 2021-01-13 MED ORDER — OXYBUTYNIN CHLORIDE 5 MG PO TABS: 5.0000 mg | ORAL_TABLET | Freq: Every day | ORAL | 3 refills | Status: DC

## 2021-01-13 NOTE — Patient Instructions (Signed)
You are released for all activies as you need at this time  Please continue all other medications as before, and refills have been done if requested.  Please have the pharmacy call with any other refills you may need.  Please continue your efforts at being more active, low cholesterol diet, and weight control.  You are otherwise up to date with prevention measures today.  Please keep your appointments with your specialists as you may have planned  We can hold on further lab testing today  Please make an Appointment to return in 6 months, or sooner if needed
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
## 2021-01-14 ENCOUNTER — Encounter: Payer: Self-pay | Admitting: Internal Medicine

## 2021-01-14 NOTE — Assessment & Plan Note (Addendum)
Age and sex appropriate education and counseling updated with regular exercise and diet Referrals for preventative services - none needed Immunizations addressed - declines flu shot, shignrix, covid booster  Smoking counseling  - none needed Evidence for depression or other mood disorder - none significant Most recent labs reviewed. I have personally reviewed and have noted: 1) the patient's medical and social history 2) The patient's current medications and supplements 3) The patient's height, weight, and BMI have been recorded in the chart

## 2021-01-14 NOTE — Assessment & Plan Note (Signed)
Lab Results  Component Value Date   LDLCALC 95 10/25/2020   Uncontrolled, goal ldl < 70, pt to continue current statin prvachol 40 as delcines change for now

## 2021-01-14 NOTE — Assessment & Plan Note (Signed)
Lab Results  Component Value Date   CREATININE 0.97 12/16/2020   Stable overall, cont to avoid nephrotoxins 

## 2021-01-14 NOTE — Assessment & Plan Note (Signed)
BP Readings from Last 3 Encounters:  01/13/21 136/76  01/09/21 (!) 189/85  12/11/20 (!) 152/82   Stable, pt to continue medical treatment norvasc

## 2021-01-14 NOTE — Assessment & Plan Note (Signed)
With mild pain residual only, for mobic prn, ok to return to work,  to f/u any worsening symptoms or concerns

## 2021-02-10 ENCOUNTER — Encounter: Payer: Self-pay | Admitting: Internal Medicine

## 2021-02-10 DIAGNOSIS — M7989 Other specified soft tissue disorders: Secondary | ICD-10-CM

## 2021-02-10 DIAGNOSIS — M79662 Pain in left lower leg: Secondary | ICD-10-CM

## 2021-02-11 ENCOUNTER — Ambulatory Visit: Payer: PPO | Admitting: Orthopedic Surgery

## 2021-02-11 ENCOUNTER — Other Ambulatory Visit: Payer: Self-pay

## 2021-02-11 DIAGNOSIS — M79642 Pain in left hand: Secondary | ICD-10-CM
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
## 2021-02-14 ENCOUNTER — Other Ambulatory Visit: Payer: Self-pay

## 2021-02-14 ENCOUNTER — Ambulatory Visit: Payer: PPO | Admitting: Family Medicine

## 2021-02-14 ENCOUNTER — Ambulatory Visit: Payer: Self-pay

## 2021-02-14 ENCOUNTER — Encounter: Payer: Self-pay | Admitting: Family Medicine

## 2021-02-14 ENCOUNTER — Ambulatory Visit (INDEPENDENT_AMBULATORY_CARE_PROVIDER_SITE_OTHER): Payer: PPO

## 2021-02-14 VITALS — BP 144/82 | HR 63 | Ht 61.0 in | Wt 175.2 lb

## 2021-02-14 DIAGNOSIS — M79662 Pain in left lower leg: Secondary | ICD-10-CM

## 2021-02-14 DIAGNOSIS — M7989 Other specified soft tissue disorders: Secondary | ICD-10-CM | POA: Diagnosis not present

## 2021-02-14 NOTE — Patient Instructions (Addendum)
Nice to meet you today.  Please get an Xray today before you leave   Follow-up: 6 weeks.   I recommend you obtained a compression sleeve to help with your joint problems. There are many options on the market however I recommend obtaining a full calf Body Helix compression sleeve.  You can find information (including how to appropriate measure yourself for sizing) can be found at www.[email or web address].ch.  Many of these products are health savings account (HSA) eligible.   You can use the compression sleeve at any time throughout the day but is most important to use while being active as well as for 2 hours post-activity.   It is appropriate to ice following activity with the compression sleeve in place.   Please use Voltaren gel (Generic Diclofenac Gel) up to 4x daily for pain as needed.  This is available over-the-counter as both the name brand Voltaren gel and the generic diclofenac gel.   Posterior Tibial Tendinitis Rehab Ask your health care provider which exercises are safe for you. Do exercises exactly as told by your health care provider and adjust them as directed. It is normal to feel mild stretching, pulling, tightness, or discomfort as you do these exercises. Stop right away if you feel sudden pain or your pain gets worse. Do not begin these exercises until told by your health care provider. Stretching and range-of-motion exercises These exercises warm up your muscles and joints and improve the movement and flexibility in your ankle and foot. These exercises may also help to relieve pain. Standing wall calf stretch, knee straight  Stand with your hands against a wall. Extend your left / right leg behind you, and bend your front knee slightly. If directed, place a folded washcloth under the arch of your foot for support. Point the toes of your back foot slightly inward. Keeping your heels on the floor and your back knee straight, shift your weight toward the wall. Do not allow your back to  arch. You should feel a gentle stretch in your upper left / right calf. Hold this position for __________ seconds. Repeat __________ times. Complete this exercise __________ times a day. Standing wall calf stretch, knee bent Stand with your hands against a wall. Extend your left / right leg behind you, and bend your front knee slightly. If directed, place a folded washcloth under the arch of your foot for support. Point the toes of your back foot slightly inward. Unlock your back knee so it is bent. Keep your heels on the floor. You should feel a gentle stretch deep in your lower left / right calf. Hold this position for __________ seconds. Repeat __________ times. Complete this exercise __________ times a day. Strengthening exercises These exercises build strength and endurance in your ankle and foot. Endurance is the ability to use your muscles for a long time, even after they get tired. Ankle inversion with band Secure one end of a rubber exercise band or tubing to a fixed object, such as a table leg or a pole, that will stay still when the band is pulled. Loop the other end of the band around the middle of your left / right foot. Sit on the floor facing the object with your left / right leg extended. The band or tube should be slightly tense when your foot is relaxed. Leading with your big toe, slowly bring your left / right foot and ankle inward, toward your other foot (inversion). Hold this position for __________ seconds. Slowly return  your foot to the starting position. Repeat __________ times. Complete this exercise __________ times a day. Towel curls  Sit in a chair on a non-carpeted surface, and put your feet on the floor. Place a towel in front of your feet. If told by your health care provider, add a __________ weight to the end of the towel. Keeping your heel on the floor, put your left / right foot on the towel. Pull the towel toward you by grabbing the towel with your toes  and curling them under. Keep your heel on the floor while you do this. Let your toes relax. Grab the towel with your toes again. Keep going until the towel is completely underneath your foot. Repeat __________ times. Complete this exercise __________ times a day. Balance exercise This exercise improves or maintains your balance. Balance is important in preventing falls. Single leg stand Without wearing shoes, stand near a railing or in a doorway. You may hold on to the railing or door frame as needed for balance. Stand on your left / right foot. Keep your big toe down on the floor and try to keep your arch lifted. If balancing in this position is too easy, try the exercise with your eyes closed or while standing on a pillow. Hold this position for __________ seconds. Repeat __________ times. Complete this exercise __________ times a day. This information is not intended to replace advice given to you by your health care provider. Make sure you discuss any questions you have with your health care provider. Document Revised: 08/02/2018 Document Reviewed: 06/08/2018 Elsevier Patient Education  2022 [employer].

## 2021-02-17 ENCOUNTER — Other Ambulatory Visit: Payer: PPO

## 2021-02-17 ENCOUNTER — Telehealth: Payer: Self-pay | Admitting: Family Medicine

## 2021-02-17 DIAGNOSIS — M899 Disorder of bone, unspecified: Secondary | ICD-10-CM | POA: Diagnosis not present

## 2021-02-17 DIAGNOSIS — M79662 Pain in left lower leg: Secondary | ICD-10-CM

## 2021-02-17 DIAGNOSIS — M7989 Other specified soft tissue disorders: Secondary | ICD-10-CM | POA: Diagnosis not present

## 2021-02-17 NOTE — Telephone Encounter (Signed)
Called pt and reviewed results and ordered SPEP

## 2021-02-17 NOTE — Progress Notes (Signed)
X-ray left tib-fib does not show any abnormality where you hurt but we incidentally see a small lytic lesion (small area where the bone is being removed by something).  This is concerning for a few things 1 of which is some cancers. Obviously we are going to look into this.  I am going to do a blood test to look into something called multiple myeloma right now.  If this is negative we are going to do some more tests as well.

## 2021-02-19 LAB — PROTEIN ELECTROPHORESIS, SERUM
Albumin ELP: 3.9 g/dL (ref 3.8–4.8)
Alpha 1: 0.3 g/dL (ref 0.2–0.3)
Alpha 2: 0.7 g/dL (ref 0.5–0.9)
Beta 2: 0.4 g/dL (ref 0.2–0.5)
Beta Globulin: 0.4 g/dL (ref 0.4–0.6)
Gamma Globulin: 0.9 g/dL (ref 0.8–1.7)
Total Protein: 6.7 g/dL (ref 6.1–8.1)

## 2021-02-19 NOTE — Progress Notes (Signed)
The initial lab work-up was negative for multiple myeloma.  Next step is a CT scan of your torso to evaluate for potential cancer like we discussed.  I have already ordered this to the [facility] location and you should hear soon about scheduling.

## 2021-02-26 ENCOUNTER — Other Ambulatory Visit: Payer: Self-pay

## 2021-02-26 ENCOUNTER — Ambulatory Visit (INDEPENDENT_AMBULATORY_CARE_PROVIDER_SITE_OTHER): Payer: PPO

## 2021-02-26 ENCOUNTER — Ambulatory Visit: Payer: PPO | Admitting: Internal Medicine

## 2021-02-26 DIAGNOSIS — Z23 Encounter for immunization: Secondary | ICD-10-CM

## 2021-03-10 ENCOUNTER — Ambulatory Visit
Admission: RE | Admit: 2021-03-10 | Discharge: 2021-03-10 | Disposition: A | Discharge status: Home / Self Care | Payer: PPO | Source: Ambulatory Visit | Attending: Family Medicine | Admitting: Family Medicine

## 2021-03-10 ENCOUNTER — Ambulatory Visit
Admission: RE | Admit: 2021-03-10 | Discharge: 2021-03-10 | Disposition: A | Discharge status: Home / Self Care | Payer: PPO | Source: Ambulatory Visit | Attending: Orthopedic Surgery | Admitting: Orthopedic Surgery

## 2021-03-10 DIAGNOSIS — N3289 Other specified disorders of bladder: Secondary | ICD-10-CM | POA: Diagnosis not present

## 2021-03-10 DIAGNOSIS — M79662 Pain in left lower leg: Secondary | ICD-10-CM

## 2021-03-10 DIAGNOSIS — Z90710 Acquired absence of both cervix and uterus: Secondary | ICD-10-CM | POA: Diagnosis not present

## 2021-03-10 DIAGNOSIS — K5730 Diverticulosis of large intestine without perforation or abscess without bleeding: Secondary | ICD-10-CM | POA: Diagnosis not present

## 2021-03-10 DIAGNOSIS — I700 Atherosclerosis of aorta: Secondary | ICD-10-CM | POA: Diagnosis not present

## 2021-03-10 DIAGNOSIS — M79642 Pain in left hand: Secondary | ICD-10-CM

## 2021-03-10 DIAGNOSIS — M899 Disorder of bone, unspecified: Secondary | ICD-10-CM

## 2021-03-10 DIAGNOSIS — M7989 Other specified soft tissue disorders: Secondary | ICD-10-CM | POA: Diagnosis not present

## 2021-03-10 MED ORDER — GADOBENATE DIMEGLUMINE 529 MG/ML IV SOLN
16.0000 mL | Freq: Once | INTRAVENOUS | Status: AC | PRN
  Administered 2021-03-10: 16 mL via INTRAVENOUS

## 2021-03-10 MED ORDER — IOPAMIDOL (ISOVUE-300) INJECTION 61%
100.0000 mL | Freq: Once | INTRAVENOUS | Status: AC | PRN
  Administered 2021-03-10: 100 mL via INTRAVENOUS

## 2021-03-10 MED ORDER — IOPAMIDOL (ISOVUE-300) INJECTION 61%: 100.0000 mL | Freq: Once | INTRAVENOUS | Status: DC | PRN

## 2021-03-12 ENCOUNTER — Telehealth: Payer: Self-pay | Admitting: Family Medicine

## 2021-03-12 DIAGNOSIS — M899 Disorder of bone, unspecified: Secondary | ICD-10-CM

## 2021-03-12 DIAGNOSIS — M898X9 Other specified disorders of bone, unspecified site: Secondary | ICD-10-CM

## 2021-03-12 DIAGNOSIS — R1900 Intra-abdominal and pelvic swelling, mass and lump, unspecified site: Secondary | ICD-10-CM

## 2021-03-12 NOTE — Telephone Encounter (Signed)
Called and left a message about her CT scan.  Recommend call back.

## 2021-03-12 NOTE — Telephone Encounter (Signed)
Pt states she is returning Dr. Zollie Pee call from today regarding her imaging. Please call her at home [phone]

## 2021-03-17 ENCOUNTER — Other Ambulatory Visit: Payer: Self-pay

## 2021-03-17 ENCOUNTER — Ambulatory Visit: Payer: PPO | Admitting: Orthopedic Surgery

## 2021-03-17 DIAGNOSIS — M79642 Pain in left hand: Secondary | ICD-10-CM
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
## 2021-03-18 NOTE — Telephone Encounter (Signed)
I spoke with Deborah Schultz on 03/17/21 about her CT scan results.  So far her metastatic work-up has been unrevealing.  CT scan chest abdomen pelvis did not show obvious source of metastasis but did show a small benign stick structure in the pelvis that should be evaluated with ultrasound.  Possible pelvic ultrasound ordered.  Additionally after discussion we will evaluate the lytic lesion seen on her tibia with an MRI.  If all else comes back negative may consider a PET scan. She expresses understanding and agreement.

## 2021-03-31 ENCOUNTER — Ambulatory Visit: Payer: PPO | Admitting: Family Medicine

## 2021-04-01 ENCOUNTER — Ambulatory Visit
Admission: RE | Admit: 2021-04-01 | Discharge: 2021-04-01 | Disposition: A | Discharge status: Home / Self Care | Payer: PPO | Source: Ambulatory Visit | Attending: Family Medicine | Admitting: Family Medicine

## 2021-04-01 DIAGNOSIS — R1900 Intra-abdominal and pelvic swelling, mass and lump, unspecified site: Secondary | ICD-10-CM

## 2021-04-01 DIAGNOSIS — Z90710 Acquired absence of both cervix and uterus: Secondary | ICD-10-CM | POA: Diagnosis not present

## 2021-04-01 DIAGNOSIS — N83292 Other ovarian cyst, left side: Secondary | ICD-10-CM | POA: Diagnosis not present

## 2021-04-04 ENCOUNTER — Other Ambulatory Visit: Payer: PPO

## 2021-04-05 ENCOUNTER — Other Ambulatory Visit: Payer: Self-pay

## 2021-04-05 ENCOUNTER — Ambulatory Visit
Admission: RE | Admit: 2021-04-05 | Discharge: 2021-04-05 | Disposition: A | Discharge status: Home / Self Care | Payer: PPO | Source: Ambulatory Visit | Attending: Family Medicine | Admitting: Family Medicine

## 2021-04-05 DIAGNOSIS — M899 Disorder of bone, unspecified: Secondary | ICD-10-CM

## 2021-04-09 ENCOUNTER — Ambulatory Visit
Admission: RE | Admit: 2021-04-09 | Discharge: 2021-04-09 | Disposition: A | Discharge status: Home / Self Care | Payer: PPO | Source: Ambulatory Visit | Attending: Family Medicine | Admitting: Family Medicine

## 2021-04-09 ENCOUNTER — Other Ambulatory Visit: Payer: Self-pay

## 2021-04-09 DIAGNOSIS — M8950 Osteolysis, unspecified site: Secondary | ICD-10-CM | POA: Diagnosis not present

## 2021-04-09 MED ORDER — GADOBENATE DIMEGLUMINE 529 MG/ML IV SOLN
15.0000 mL | Freq: Once | INTRAVENOUS | Status: AC | PRN
  Administered 2021-04-09: 11:00:00 15 mL via INTRAVENOUS

## 2021-04-10 ENCOUNTER — Encounter: Payer: Self-pay | Admitting: Family Medicine

## 2021-04-10 NOTE — Progress Notes (Signed)
Pelvic ultrasound shows ovarian cyst that do not require follow-up according to the radiologist.  No cancer suspected.

## 2021-04-10 NOTE — Progress Notes (Signed)
Lower leg MRI looks normal to radiology.  No evidence of cancer.  I do not think we need to do any more testing.

## 2021-05-13 ENCOUNTER — Other Ambulatory Visit: Payer: Self-pay | Admitting: Internal Medicine

## 2021-05-13 NOTE — Telephone Encounter (Signed)
Please refill as per office routine med refill policy (all routine meds to be refilled for 3 mo or monthly (per pt preference) up to one year from last visit, then month to month grace period for 3 mo, then further med refills will have to be denied) ? ?

## 2021-08-07 ENCOUNTER — Other Ambulatory Visit: Payer: Self-pay | Admitting: Internal Medicine

## 2021-08-07 NOTE — Telephone Encounter (Signed)
Please refill as per office routine med refill policy (all routine meds to be refilled for 3 mo or monthly (per pt preference) up to one year from last visit, then month to month grace period for 3 mo, then further med refills will have to be denied) ? ?

## 2021-08-12 ENCOUNTER — Telehealth: Payer: Self-pay | Admitting: Internal Medicine

## 2021-08-12 NOTE — Telephone Encounter (Signed)
Called pt to schedule AWV with [facility]. Patient declined AWV. Sent to PCP  ?

## 2021-10-03 ENCOUNTER — Encounter: Payer: Self-pay | Admitting: Internal Medicine

## 2021-10-03 ENCOUNTER — Ambulatory Visit (INDEPENDENT_AMBULATORY_CARE_PROVIDER_SITE_OTHER): Payer: PPO | Admitting: Internal Medicine

## 2021-10-03 VITALS — BP 142/70 | HR 64 | Temp 98.4°F | Ht 61.0 in | Wt 178.0 lb

## 2021-10-03 DIAGNOSIS — N1831 Chronic kidney disease, stage 3a: Secondary | ICD-10-CM | POA: Diagnosis not present

## 2021-10-03 DIAGNOSIS — R300 Dysuria: Secondary | ICD-10-CM | POA: Diagnosis not present

## 2021-10-03 DIAGNOSIS — I10 Essential (primary) hypertension: Secondary | ICD-10-CM | POA: Diagnosis not present

## 2021-10-03 DIAGNOSIS — L989 Disorder of the skin and subcutaneous tissue, unspecified: Secondary | ICD-10-CM

## 2021-10-03 LAB — URINALYSIS, ROUTINE W REFLEX MICROSCOPIC
Bilirubin Urine: NEGATIVE
Ketones, ur: NEGATIVE
Nitrite: POSITIVE — AB
Specific Gravity, Urine: 1.01 (ref 1.000–1.030)
Total Protein, Urine: NEGATIVE
Urine Glucose: NEGATIVE
Urobilinogen, UA: 0.2 (ref 0.0–1.0)
pH: 5.5 (ref 5.0–8.0)

## 2021-10-03 MED ORDER — CEPHALEXIN 500 MG PO CAPS: 500.0000 mg | ORAL_CAPSULE | Freq: Three times a day (TID) | ORAL | 0 refills | Status: DC

## 2021-10-03 NOTE — Progress Notes (Unsigned)
Patient ID: Deborah Schultz, female   DOB: [birth year], 76 y.o.   MRN: [id number]        Chief Complaint: follow up HTN, dysuria, right upper chest skin lesion       HPI:  Deborah Schultz is a 76 y.o. female here with c/o 2-3 day sonset dysuria with tinge of blood, cloudy, and bad odor, and Denies urinary symptoms such as frequency, urgency, flank pain, or n/v, fever, chills.  BP has been controlled at home < 140/90.  Pt denies chest pain, increased sob or doe, wheezing, orthopnea, PND, increased LE swelling, palpitations, dizziness or syncope.   Pt denies polydipsia, polyuria, or new focal neuro s/s.    Pt denies fever, wt loss, night sweats, loss of appetite, or other constitutional symptoms  Does have a skin lesion cyst like to the right upper chest enlarging in the past month       Wt Readings from Last 3 Encounters:  10/03/21 178 lb (80.7 kg)  02/14/21 175 lb 3.2 oz (79.5 kg)  01/13/21 171 lb 12.8 oz (77.9 kg)   BP Readings from Last 3 Encounters:  10/03/21 (!) 142/70  02/14/21 (!) 144/82  01/13/21 136/76         Past Medical History:  Diagnosis Date   ALOPECIA 06/06/2008   Anemia    iron deficiency   ANEMIA-IRON DEFICIENCY 06/06/2008   BICEPS TENDINITIS, LEFT 05/06/2010   BRONCHITIS, ACUTE 06/18/2008   CALLUSES, FEET, BILATERAL 06/06/2008   Chronic pruritus    CONSTIPATION 06/06/2008   Cough 06/18/2008   Degenerative disc disease, cervical    Diverticulitis    DIVERTICULITIS, HX OF 06/06/2008   ELBOW PAIN 05/26/2010   FATIGUE 06/18/2008   HLD (hyperlipidemia)    HTN (hypertension)    HYPERLIPIDEMIA 06/06/2008   OAB (overactive bladder)    OVERACTIVE BLADDER 06/06/2008   PERIPHERAL EDEMA 02/19/2009   Peripheral edema 02/26/2011   Right carpal tunnel syndrome    SINUSITIS- ACUTE-NOS 01/08/2009   Past Surgical History:  Procedure Laterality Date   ABDOMINAL HYSTERECTOMY     APPENDECTOMY     BREAST BIOPSY     BUNIONECTOMY     CATARACT EXTRACTION     bilateral  11/2009-no longer wears glasses   ECTOPIC PREGNANCY SURGERY     OOPHORECTOMY     right thumb surgery      reports that she has never smoked. She has never used smokeless tobacco. She reports current alcohol use. She reports that she does not use drugs. family history includes Cancer in her maternal aunt and maternal uncle; Diabetes in her brother; Hypertension in her mother; Ovarian cancer in her mother. Allergies  Allergen Reactions   Nystatin Rash   Sulfa Antibiotics Anaphylaxis    Per pt: unknown reaction   Esomeprazole Magnesium     REACTION: Headache   Fexofenadine     REACTION: Hives   Sulfonamide Derivatives     Per pt: unknown reaction   Current Outpatient Medications on File Prior to Visit  Medication Sig Dispense Refill   amLODipine (NORVASC) 5 MG tablet Take 1 tablet by mouth once daily 90 tablet 1   aspirin 81 MG tablet Take 81 mg by mouth daily.       butalbital-acetaminophen-caffeine (FIORICET) 50-325-40 MG tablet Take 1 tablet by mouth every 6 (six) hours as needed for headache. 14 tablet 0   cholecalciferol (VITAMIN D3) 25 MCG (1000 UNIT) tablet Take 1,000 Units by mouth daily.     estradiol (ESTRACE)  1 MG tablet Take 1 tablet (1 mg total) by mouth daily. 90 tablet 3   furosemide (LASIX) 40 MG tablet TAKE 1 TABLET BY MOUTH TWICE DAILY WITH  THE  2ND  DOSE  ONLY  AS  NEEDED  FOR  PERSISTANT  SWELLING 180 tablet 3   meloxicam (MOBIC) 15 MG tablet Take 1 tablet (15 mg total) by mouth daily. 30 tablet 0   Multiple Vitamin (MULTIVITAMIN) tablet Take 1 tablet by mouth daily.       oxybutynin (DITROPAN) 5 MG tablet Take 1 tablet (5 mg total) by mouth daily. 90 tablet 3   pravastatin (PRAVACHOL) 40 MG tablet Take 1 tablet (40 mg total) by mouth daily. 90 tablet 3   propranolol ER (INDERAL LA) 80 MG 24 hr capsule Take 1 capsule (80 mg total) by mouth daily. 90 capsule 3   telmisartan (MICARDIS) 40 MG tablet Take 1 tablet (40 mg total) by mouth daily. 90 tablet 3   triamcinolone  cream (KENALOG) 0.1 % Apply 1 application topically 2 (two) times daily. 30 g 0   cyclobenzaprine (FLEXERIL) 5 MG tablet Take 1 tablet (5 mg total) by mouth 3 (three) times daily as needed for muscle spasms. (Patient not taking: Reported on 02/14/2021) 30 tablet 1   No current facility-administered medications on file prior to visit.        ROS:  All others reviewed and negative.  Objective        PE:  BP (!) 142/70 (BP Location: Left Arm, Patient Position: Sitting, Cuff Size: Large)   Pulse 64   Temp 98.4 F (36.9 C) (Oral)   Ht 5\' 1"  (1.549 m)   Wt 178 lb (80.7 kg)   SpO2 98%   BMI 33.63 kg/m                 Constitutional: Pt appears in NAD               HENT: Head: NCAT.                Right Ear: External ear normal.                 Left Ear: External ear normal.                Eyes: . Pupils are equal, round, and reactive to light. Conjunctivae and EOM are normal               Nose: without d/c or deformity               Neck: Neck supple. Gross normal ROM               Cardiovascular: Normal rate and regular rhythm.                 Pulmonary/Chest: Effort normal and breath sounds without rales or wheezing.                Abd:  Soft, mild low mid abd tender, ND, + BS, no organomegaly               Neurological: Pt is alert. At baseline orientation, motor grossly intact               Skin: Skin is warm. No rashes, no other new lesions, LE edema - none               Psychiatric: Pt behavior is normal without agitation   Micro:  none  Cardiac tracings I have personally interpreted today:  none  Pertinent Radiological findings (summarize): none   Lab Results  Component Value Date   WBC 7.3 10/25/2020   HGB 13.1 10/25/2020   HCT 39.2 10/25/2020   PLT 250.0 10/25/2020   GLUCOSE 81 12/16/2020   CHOL 192 10/25/2020   TRIG 146.0 10/25/2020   HDL 67.90 10/25/2020   LDLCALC 95 10/25/2020   ALT 26 10/25/2020   AST 26 10/25/2020   NA 136 12/16/2020   K 3.9 12/16/2020   CL  103 12/16/2020   CREATININE 0.97 12/16/2020   BUN 17 12/16/2020   CO2 24 12/16/2020   TSH 3.07 10/25/2020   Assessment/Plan:  Deborah Schultz is a 76 y.o. Black or African American [2] female with  has a past medical history of ALOPECIA (06/06/2008), Anemia, ANEMIA-IRON DEFICIENCY (06/06/2008), BICEPS TENDINITIS, LEFT (05/06/2010), BRONCHITIS, ACUTE (06/18/2008), CALLUSES, FEET, BILATERAL (06/06/2008), Chronic pruritus, CONSTIPATION (06/06/2008), Cough (06/18/2008), Degenerative disc disease, cervical, Diverticulitis, DIVERTICULITIS, HX OF (06/06/2008), ELBOW PAIN (05/26/2010), FATIGUE (06/18/2008), HLD (hyperlipidemia), HTN (hypertension), HYPERLIPIDEMIA (06/06/2008), OAB (overactive bladder), OVERACTIVE BLADDER (06/06/2008), PERIPHERAL EDEMA (02/19/2009), Peripheral edema (02/26/2011), Right carpal tunnel syndrome, and SINUSITIS- ACUTE-NOS (01/08/2009).  Dysuria Exam c/w high prob cystitis - for urine studies, empiric antibx course cephalexin  Essential hypertension BP Readings from Last 3 Encounters:  10/03/21 (!) 142/70  02/14/21 (!) 144/82  01/13/21 136/76   Mild uncontrolled today, likely reactive it seems, pt states controlled at home, pt to continue medical treatment norvasc 5 qd, inderal LA 80 qd, micardis 40 qd   CKD (chronic kidney disease) stage 3, GFR 30-59 ml/min (HCC) Lab Results  Component Value Date   CREATININE 0.97 12/16/2020   Stable overall, cont to avoid nephrotoxins   Skin lesion of chest wall Right upper chest enlarging, cyst like per exam, for derm referral per pt reqeust  Followup: Return if symptoms worsen or fail to improve.  Oliver Barre, MD 10/05/2021 9:06 PM [facility] - [facility]

## 2021-10-03 NOTE — Patient Instructions (Signed)
Please take all new medication as prescribed - the antibiotic  Please continue all other medications as before, and refills have been done if requested.  Please have the pharmacy call with any other refills you may need.  Please keep your appointments with your specialists as you may have planned  You will be contacted regarding the referral for: dermatology  Please go to the LAB at the blood drawing area for the tests to be done - just the urine testing this morning  You will be contacted by phone if any changes need to be made immediately.  Otherwise, you will receive a letter about your results with an explanation, but please check with MyChart first.  Please remember to sign up for MyChart if you have not done so, as this will be important to you in the future with finding out test results, communicating by private email, and scheduling acute appointments online when needed.  Please make an Appointment to return in August as you mentioned

## 2021-10-04 ENCOUNTER — Encounter: Payer: Self-pay | Admitting: Internal Medicine

## 2021-10-05 ENCOUNTER — Encounter: Payer: Self-pay | Admitting: Internal Medicine

## 2021-10-05 DIAGNOSIS — L989 Disorder of the skin and subcutaneous tissue, unspecified: Secondary | ICD-10-CM | POA: Insufficient documentation

## 2021-10-05 NOTE — Assessment & Plan Note (Signed)
BP Readings from Last 3 Encounters:  10/03/21 (!) 142/70  02/14/21 (!) 144/82  01/13/21 136/76   Mild uncontrolled today, likely reactive it seems, pt states controlled at home, pt to continue medical treatment norvasc 5 qd, inderal LA 80 qd, micardis 40 qd

## 2021-10-05 NOTE — Assessment & Plan Note (Signed)
Lab Results  Component Value Date   CREATININE 0.97 12/16/2020   Stable overall, cont to avoid nephrotoxins 

## 2021-10-05 NOTE — Assessment & Plan Note (Signed)
Right upper chest enlarging, cyst like per exam, for derm referral per pt reqeust

## 2021-10-05 NOTE — Assessment & Plan Note (Signed)
Exam c/w high prob cystitis - for urine studies, empiric antibx course cephalexin

## 2021-10-06 ENCOUNTER — Ambulatory Visit: Payer: PPO | Admitting: Internal Medicine

## 2021-10-06 LAB — URINE CULTURE

## 2021-10-08 ENCOUNTER — Encounter: Payer: Self-pay | Admitting: Internal Medicine

## 2021-10-22 DIAGNOSIS — L728 Other follicular cysts of the skin and subcutaneous tissue: Secondary | ICD-10-CM | POA: Diagnosis not present

## 2021-10-22 DIAGNOSIS — L810 Postinflammatory hyperpigmentation: Secondary | ICD-10-CM | POA: Diagnosis not present

## 2021-10-22 DIAGNOSIS — R208 Other disturbances of skin sensation: Secondary | ICD-10-CM | POA: Diagnosis not present

## 2021-11-11 ENCOUNTER — Ambulatory Visit (INDEPENDENT_AMBULATORY_CARE_PROVIDER_SITE_OTHER): Payer: PPO | Admitting: Internal Medicine

## 2021-11-11 ENCOUNTER — Encounter: Payer: Self-pay | Admitting: Internal Medicine

## 2021-11-11 VITALS — BP 152/74 | HR 65 | Temp 97.9°F | Ht 61.0 in | Wt 176.4 lb

## 2021-11-11 DIAGNOSIS — R609 Edema, unspecified: Secondary | ICD-10-CM | POA: Diagnosis not present

## 2021-11-11 DIAGNOSIS — E538 Deficiency of other specified B group vitamins: Secondary | ICD-10-CM

## 2021-11-11 DIAGNOSIS — D509 Iron deficiency anemia, unspecified: Secondary | ICD-10-CM | POA: Diagnosis not present

## 2021-11-11 DIAGNOSIS — E559 Vitamin D deficiency, unspecified: Secondary | ICD-10-CM

## 2021-11-11 DIAGNOSIS — R739 Hyperglycemia, unspecified: Secondary | ICD-10-CM | POA: Diagnosis not present

## 2021-11-11 DIAGNOSIS — R600 Localized edema: Secondary | ICD-10-CM

## 2021-11-11 DIAGNOSIS — E7800 Pure hypercholesterolemia, unspecified: Secondary | ICD-10-CM

## 2021-11-11 DIAGNOSIS — N1831 Chronic kidney disease, stage 3a: Secondary | ICD-10-CM | POA: Diagnosis not present

## 2021-11-11 DIAGNOSIS — M7989 Other specified soft tissue disorders: Secondary | ICD-10-CM

## 2021-11-11 DIAGNOSIS — Z0001 Encounter for general adult medical examination with abnormal findings: Secondary | ICD-10-CM | POA: Diagnosis not present

## 2021-11-11 DIAGNOSIS — I10 Essential (primary) hypertension: Secondary | ICD-10-CM | POA: Diagnosis not present

## 2021-11-11 LAB — URINALYSIS, ROUTINE W REFLEX MICROSCOPIC
Bilirubin Urine: NEGATIVE
Hgb urine dipstick: NEGATIVE
Ketones, ur: NEGATIVE
Leukocytes,Ua: NEGATIVE
Nitrite: NEGATIVE
Specific Gravity, Urine: <=1.005 — AB (ref 1.000–1.030)
Total Protein, Urine: NEGATIVE
Urine Glucose: NEGATIVE
Urobilinogen, UA: 0.2 (ref 0.0–1.0)
pH: 7 (ref 5.0–8.0)

## 2021-11-11 LAB — HEMOGLOBIN A1C: Hgb A1c MFr Bld: 6 % (ref 4.6–6.5)

## 2021-11-11 LAB — CBC WITH DIFFERENTIAL/PLATELET
Basophils Absolute: 0 K/uL (ref 0.0–0.1)
Basophils Relative: 0.6 % (ref 0.0–3.0)
Eosinophils Absolute: 0.2 K/uL (ref 0.0–0.7)
Eosinophils Relative: 3.3 % (ref 0.0–5.0)
HCT: 39.6 % (ref 36.0–46.0)
Hemoglobin: 13.2 g/dL (ref 12.0–15.0)
Lymphocytes Relative: 30 % (ref 12.0–46.0)
Lymphs Abs: 2.3 K/uL (ref 0.7–4.0)
MCHC: 33.3 g/dL (ref 30.0–36.0)
MCV: 91 fl (ref 78.0–100.0)
Monocytes Absolute: 0.9 K/uL (ref 0.1–1.0)
Monocytes Relative: 11.3 % (ref 3.0–12.0)
Neutro Abs: 4.1 K/uL (ref 1.4–7.7)
Neutrophils Relative %: 54.8 % (ref 43.0–77.0)
Platelets: 268 K/uL (ref 150.0–400.0)
RBC: 4.35 Mil/uL (ref 3.87–5.11)
RDW: 14 % (ref 11.5–15.5)
WBC: 7.6 K/uL (ref 4.0–10.5)

## 2021-11-11 LAB — BASIC METABOLIC PANEL
BUN: 13 mg/dL (ref 6–23)
CO2: 26 mEq/L (ref 19–32)
Calcium: 9.8 mg/dL (ref 8.4–10.5)
Chloride: 101 mEq/L (ref 96–112)
Creatinine, Ser: 0.89 mg/dL (ref 0.40–1.20)
GFR: 63.86 mL/min (ref >60.00)
Glucose, Bld: 86 mg/dL (ref 70–99)
Potassium: 4.1 mEq/L (ref 3.5–5.1)
Sodium: 137 mEq/L (ref 135–145)

## 2021-11-11 LAB — HEPATIC FUNCTION PANEL
ALT: 25 U/L (ref 0–35)
AST: 24 U/L (ref 0–37)
Albumin: 4.4 g/dL (ref 3.5–5.2)
Alkaline Phosphatase: 75 U/L (ref 39–117)
Bilirubin, Direct: 0.1 mg/dL (ref 0.0–0.3)
Total Bilirubin: 0.4 mg/dL (ref 0.2–1.2)
Total Protein: 7.4 g/dL (ref 6.0–8.3)

## 2021-11-11 LAB — TSH: TSH: 3.74 u[IU]/mL (ref 0.35–5.50)

## 2021-11-11 LAB — IBC PANEL
Iron: 88 ug/dL (ref 42–145)
Saturation Ratios: 18.6 % — ABNORMAL LOW (ref 20.0–50.0)
TIBC: 473.2 ug/dL — ABNORMAL HIGH (ref 250.0–450.0)
Transferrin: 338 mg/dL (ref 212.0–360.0)

## 2021-11-11 LAB — LIPID PANEL
Cholesterol: 190 mg/dL (ref 0–200)
HDL: 64 mg/dL
LDL Cholesterol: 94 mg/dL (ref 0–99)
NonHDL: 125.56
Total CHOL/HDL Ratio: 3
Triglycerides: 156 mg/dL — ABNORMAL HIGH (ref 0.0–149.0)
VLDL: 31.2 mg/dL (ref 0.0–40.0)

## 2021-11-11 LAB — VITAMIN B12: Vitamin B-12: 1402 pg/mL — ABNORMAL HIGH (ref 211–911)

## 2021-11-11 LAB — VITAMIN D 25 HYDROXY (VIT D DEFICIENCY, FRACTURES): VITD: 55.73 ng/mL (ref 30.00–100.00)

## 2021-11-11 LAB — FERRITIN: Ferritin: 89.1 ng/mL (ref 10.0–291.0)

## 2021-11-11 LAB — BRAIN NATRIURETIC PEPTIDE: Pro B Natriuretic peptide (BNP): 66 pg/mL (ref 0.0–100.0)

## 2021-11-11 NOTE — Patient Instructions (Addendum)
Please have your Shingles shot at the [facility] to take the second dose of fluid pill (lasix) more often to deep the swelling better  Ok to increase the micardis (telmisartan) to 80 mg per day for better BP at home  Your right upper chest bruising will improve in the next few weeks  Please continue all other medications as before, and refills have been done if requested.  Please have the pharmacy call with any other refills you may need.  Please continue your efforts at being more active, low cholesterol diet, and weight control.  You are otherwise up to date with prevention measures today.  Please keep your appointments with your specialists as you may have planned  Please go to the LAB at the blood drawing area for the tests to be done  You will be contacted by phone if any changes need to be made immediately.  Otherwise, you will receive a letter about your results with an explanation, but please check with MyChart first.  Please remember to sign up for MyChart if you have not done so, as this will be important to you in the future with finding out test results, communicating by private email, and scheduling acute appointments online when needed.  Please make an Appointment to return in 6 months, or sooner if needed

## 2021-11-11 NOTE — Progress Notes (Unsigned)
Patient ID: Deborah Schultz, female   DOB: 1945-10-11, 76 y.o.   MRN: [id number]         Chief Complaint:: wellness exam and bruising right upper chest wall, htn, persistent leg swelling       HPI:  Deborah Schultz is a 76 y.o. female here for wellness exam; declines covid booster, shingrix, o/w up to date                        Also swelling to the LEs now every day persistent mild despite lasix, though at times can be mod to severe to her with pain, despite the lasix The swelling used to resolve at night, but not so much in past month, more chronic.  No recent over overt bleeding or bruising except for bruising about the post cyst removal site right upper chest July 5 per derm.  BP at home has been mild increased as well.  Pt denies chest pain, increased sob or doe, wheezing, orthopnea, PND, increased LE swelling, palpitations, dizziness or syncope.   Pt denies polydipsia, polyuria, or new focal neuro s/s.    Pt denies fever, wt loss, night sweats, loss of appetite, or other constitutional symptoms     Wt Readings from Last 3 Encounters:  11/11/21 176 lb 6.4 oz (80 kg)  10/03/21 178 lb (80.7 kg)  02/14/21 175 lb 3.2 oz (79.5 kg)   BP Readings from Last 3 Encounters:  11/11/21 (!) 152/74  10/03/21 (!) 142/70  02/14/21 (!) 144/82   Immunization History  Administered Date(s) Administered   Fluad Quad(high Dose 65+) 12/23/2018, 03/02/2020, 02/26/2021   Influenza Split 02/29/2012   Influenza Whole 01/19/2008   Influenza, High Dose Seasonal PF 02/02/2017, 12/16/2017   PFIZER(Purple Top)SARS-COV-2 Vaccination 05/12/2019, 06/02/2019, 01/18/2020, 02/10/2021   Pneumococcal Conjugate-13 10/22/2016   Pneumococcal Polysaccharide-23 10/26/2017   Td 06/06/2008   Tdap 09/19/2018   Zoster, Live 06/06/2008   There are no preventive care reminders to display for this patient.     Past Medical History:  Diagnosis Date   ALOPECIA 06/06/2008   Anemia    iron deficiency    ANEMIA-IRON DEFICIENCY 06/06/2008   BICEPS TENDINITIS, LEFT 05/06/2010   BRONCHITIS, ACUTE 06/18/2008   CALLUSES, FEET, BILATERAL 06/06/2008   Chronic pruritus    CONSTIPATION 06/06/2008   Cough 06/18/2008   Degenerative disc disease, cervical    Diverticulitis    DIVERTICULITIS, HX OF 06/06/2008   ELBOW PAIN 05/26/2010   FATIGUE 06/18/2008   HLD (hyperlipidemia)    HTN (hypertension)    HYPERLIPIDEMIA 06/06/2008   OAB (overactive bladder)    OVERACTIVE BLADDER 06/06/2008   PERIPHERAL EDEMA 02/19/2009   Peripheral edema 02/26/2011   Right carpal tunnel syndrome    SINUSITIS- ACUTE-NOS 01/08/2009   Past Surgical History:  Procedure Laterality Date   ABDOMINAL HYSTERECTOMY     APPENDECTOMY     BREAST BIOPSY     BUNIONECTOMY     CATARACT EXTRACTION     bilateral 11/2009-no longer wears glasses   ECTOPIC PREGNANCY SURGERY     OOPHORECTOMY     right thumb surgery      reports that she has never smoked. She has never used smokeless tobacco. She reports current alcohol use. She reports that she does not use drugs. family history includes Cancer in her maternal aunt and maternal uncle; Diabetes in her brother; Hypertension in her mother; Ovarian cancer in her mother. Allergies  Allergen Reactions   Nystatin Rash  Sulfa Antibiotics Anaphylaxis    Per pt: unknown reaction   Esomeprazole Magnesium     REACTION: Headache   Fexofenadine     REACTION: Hives   Sulfonamide Derivatives     Per pt: unknown reaction   Current Outpatient Medications on File Prior to Visit  Medication Sig Dispense Refill   amLODipine (NORVASC) 5 MG tablet Take 1 tablet by mouth once daily 90 tablet 1   aspirin 81 MG tablet Take 81 mg by mouth daily.       cholecalciferol (VITAMIN D3) 25 MCG (1000 UNIT) tablet Take 1,000 Units by mouth daily.     estradiol (ESTRACE) 1 MG tablet Take 1 tablet (1 mg total) by mouth daily. 90 tablet 3   furosemide (LASIX) 40 MG tablet TAKE 1 TABLET BY MOUTH TWICE DAILY WITH  THE  2ND   DOSE  ONLY  AS  NEEDED  FOR  PERSISTANT  SWELLING 180 tablet 3   Multiple Vitamin (MULTIVITAMIN) tablet Take 1 tablet by mouth daily.       oxybutynin (DITROPAN) 5 MG tablet Take 1 tablet (5 mg total) by mouth daily. 90 tablet 3   pravastatin (PRAVACHOL) 40 MG tablet Take 1 tablet (40 mg total) by mouth daily. 90 tablet 3   propranolol ER (INDERAL LA) 80 MG 24 hr capsule Take 1 capsule (80 mg total) by mouth daily. 90 capsule 3   triamcinolone cream (KENALOG) 0.1 % Apply 1 application topically 2 (two) times daily. 30 g 0   No current facility-administered medications on file prior to visit.        ROS:  All others reviewed and negative.  Objective        PE:  BP (!) 152/74 (BP Location: Right Arm, Patient Position: Sitting, Cuff Size: Large)   Pulse 65   Temp 97.9 F (36.6 C) (Oral)   Ht 5\' 1"  (1.549 m)   Wt 176 lb 6.4 oz (80 kg)   SpO2 98%   BMI 33.33 kg/m                 Constitutional: Pt appears in NAD               HENT: Head: NCAT.                Right Ear: External ear normal.                 Left Ear: External ear normal.                Eyes: . Pupils are equal, round, and reactive to light. Conjunctivae and EOM are normal               Nose: without d/c or deformity               Neck: Neck supple. Gross normal ROM               Cardiovascular: Normal rate and regular rhythm.                 Pulmonary/Chest: Effort normal and breath sounds without rales or wheezing.                Abd:  Soft, NT, ND, + BS, no organomegaly               Neurological: Pt is alert. At baseline orientation, motor grossly intact  Skin: Skin is warm. Right upper chest wall slight bruising post cyst removal, LE edema - trace to 1+ bilateral               Psychiatric: Pt behavior is normal without agitation   Micro: none  Cardiac tracings I have personally interpreted today:  none  Pertinent Radiological findings (summarize): none   Lab Results  Component Value Date   WBC  7.6 11/11/2021   HGB 13.2 11/11/2021   HCT 39.6 11/11/2021   PLT 268.0 11/11/2021   GLUCOSE 86 11/11/2021   CHOL 190 11/11/2021   TRIG 156.0 (H) 11/11/2021   HDL 64.00 11/11/2021   LDLCALC 94 11/11/2021   ALT 25 11/11/2021   AST 24 11/11/2021   NA 137 11/11/2021   K 4.1 11/11/2021   CL 101 11/11/2021   CREATININE 0.89 11/11/2021   BUN 13 11/11/2021   CO2 26 11/11/2021   TSH 3.74 11/11/2021   HGBA1C 6.0 11/11/2021   Assessment/Plan:  Deborah Schultz is a 76 y.o. Black or African American [2] female with  has a past medical history of ALOPECIA (06/06/2008), Anemia, ANEMIA-IRON DEFICIENCY (06/06/2008), BICEPS TENDINITIS, LEFT (05/06/2010), BRONCHITIS, ACUTE (06/18/2008), CALLUSES, FEET, BILATERAL (06/06/2008), Chronic pruritus, CONSTIPATION (06/06/2008), Cough (06/18/2008), Degenerative disc disease, cervical, Diverticulitis, DIVERTICULITIS, HX OF (06/06/2008), ELBOW PAIN (05/26/2010), FATIGUE (06/18/2008), HLD (hyperlipidemia), HTN (hypertension), HYPERLIPIDEMIA (06/06/2008), OAB (overactive bladder), OVERACTIVE BLADDER (06/06/2008), PERIPHERAL EDEMA (02/19/2009), Peripheral edema (02/26/2011), Right carpal tunnel syndrome, and SINUSITIS- ACUTE-NOS (01/08/2009).  Encounter for well adult exam with abnormal findings Age and sex appropriate education and counseling updated with regular exercise and diet Referrals for preventative services - none needed Immunizations addressed - declines covid booster, shingrix Smoking counseling  - none needed Evidence for depression or other mood disorder - none significant Most recent labs reviewed. I have personally reviewed and have noted: 1) the patient's medical and social history 2) The patient's current medications and supplements 3) The patient's height, weight, and BMI have been recorded in the chart   Peripheral edema Chronic persistent, pt enocuraged to take the pm lasix prn dose more often  Hyperlipidemia Lab Results  Component Value Date    LDLCALC 94 11/11/2021   Uncontrolled, goal ld l< 70, pt to continue current statin pravachol 40 mg as declines change for now   Essential hypertension BP Readings from Last 3 Encounters:  11/11/21 (!) 152/74  10/03/21 (!) 142/70  02/14/21 (!) 144/82   Stable, pt to continue medical treatment norvasc 5 mg, inderal la 80 mg qd, and increase the micardis to 80 mg qd   CKD (chronic kidney disease) stage 3, GFR 30-59 ml/min (HCC) Lab Results  Component Value Date   CREATININE 0.89 11/11/2021   Stable overall, cont to avoid nephrotoxins   ANEMIA-IRON DEFICIENCY Also for f/u iron levels  Followup: Return in about 6 months (around 05/14/2022).  Cathlean Cower, MD 11/13/2021 9:38 PM [facility]

## 2021-11-12 ENCOUNTER — Other Ambulatory Visit: Payer: Self-pay | Admitting: Internal Medicine

## 2021-11-12 ENCOUNTER — Telehealth: Payer: Self-pay

## 2021-11-12 DIAGNOSIS — Z1231 Encounter for screening mammogram for malignant neoplasm of breast: Secondary | ICD-10-CM

## 2021-11-12 MED ORDER — TELMISARTAN 80 MG PO TABS: 80.0000 mg | ORAL_TABLET | Freq: Every day | ORAL | 1 refills | Status: DC

## 2021-11-12 NOTE — Telephone Encounter (Signed)
Per last ov MD agreed to increase micardis to 80 mg. Call pt no answer LMOM will send new prescription to [facility].Marland KitchenRaechel Chute

## 2021-11-12 NOTE — Telephone Encounter (Signed)
Pt was calling to inquire about the Rx micardis (telmisartan) that was suppose to be increased to 80 mg per day that she thought was going to be sent.  Pharmacy: [facility] [address] ([address]), [place] - [address]  Please advise

## 2021-11-13 ENCOUNTER — Encounter: Payer: Self-pay | Admitting: Internal Medicine

## 2021-11-13 NOTE — Assessment & Plan Note (Signed)
Also for f/u iron levels

## 2021-11-13 NOTE — Assessment & Plan Note (Signed)
Chronic persistent, pt enocuraged to take the pm lasix prn dose more often

## 2021-11-13 NOTE — Assessment & Plan Note (Signed)

## 2021-11-13 NOTE — Assessment & Plan Note (Signed)
BP Readings from Last 3 Encounters:  11/11/21 (!) 152/74  10/03/21 (!) 142/70  02/14/21 (!) 144/82   Stable, pt to continue medical treatment norvasc 5 mg, inderal la 80 mg qd, and increase the micardis to 80 mg qd

## 2021-11-13 NOTE — Assessment & Plan Note (Signed)
Lab Results  Component Value Date   CREATININE 0.89 11/11/2021   Stable overall, cont to avoid nephrotoxins

## 2021-11-13 NOTE — Assessment & Plan Note (Signed)
Lab Results  Component Value Date   LDLCALC 94 11/11/2021   Uncontrolled, goal ld l< 70, pt to continue current statin pravachol 40 mg as declines change for now

## 2021-12-29 ENCOUNTER — Ambulatory Visit
Admission: RE | Admit: 2021-12-29 | Discharge: 2021-12-29 | Disposition: A | Discharge status: Home / Self Care | Payer: PPO | Source: Ambulatory Visit | Attending: Internal Medicine | Admitting: Internal Medicine

## 2021-12-29 DIAGNOSIS — Z1231 Encounter for screening mammogram for malignant neoplasm of breast: Secondary | ICD-10-CM | POA: Diagnosis not present

## 2022-01-10 ENCOUNTER — Other Ambulatory Visit: Payer: Self-pay | Admitting: Internal Medicine

## 2022-01-10 NOTE — Telephone Encounter (Signed)
Please refill as per office routine med refill policy (all routine meds to be refilled for 3 mo or monthly (per pt preference) up to one year from last visit, then month to month grace period for 3 mo, then further med refills will have to be denied) ? ?

## 2022-01-29 ENCOUNTER — Other Ambulatory Visit: Payer: Self-pay

## 2022-01-29 MED ORDER — COVID-19 MRNA 2023-2024 VACCINE (COMIRNATY) 0.3 ML INJECTION
INTRAMUSCULAR | 0 refills | Status: DC
  Filled 2022-01-29: qty 0.3, 1d supply, fill #0

## 2022-01-31 ENCOUNTER — Other Ambulatory Visit: Payer: Self-pay | Admitting: Internal Medicine

## 2022-01-31 NOTE — Telephone Encounter (Signed)
Please refill as per office routine med refill policy (all routine meds to be refilled for 3 mo or monthly (per pt preference) up to one year from last visit, then month to month grace period for 3 mo, then further med refills will have to be denied) ? ?

## 2022-02-02 ENCOUNTER — Encounter: Payer: Self-pay | Admitting: Internal Medicine

## 2022-02-09 ENCOUNTER — Other Ambulatory Visit: Payer: Self-pay | Admitting: Internal Medicine

## 2022-02-13 ENCOUNTER — Ambulatory Visit (INDEPENDENT_AMBULATORY_CARE_PROVIDER_SITE_OTHER): Payer: PPO | Admitting: Internal Medicine

## 2022-02-13 ENCOUNTER — Encounter: Payer: Self-pay | Admitting: Internal Medicine

## 2022-02-13 VITALS — BP 160/80 | HR 62 | Temp 98.1°F | Ht 61.0 in | Wt 176.0 lb

## 2022-02-13 DIAGNOSIS — J029 Acute pharyngitis, unspecified: Secondary | ICD-10-CM | POA: Diagnosis not present

## 2022-02-13 LAB — POC COVID19 BINAXNOW: SARS Coronavirus 2 Ag: NEGATIVE

## 2022-02-13 MED ORDER — FLUTICASONE PROPIONATE 50 MCG/ACT NA SUSP: 2.0000 | Freq: Every day | NASAL | 6 refills | Status: DC

## 2022-02-13 MED ORDER — BENZONATATE 200 MG PO CAPS: 200.0000 mg | ORAL_CAPSULE | Freq: Three times a day (TID) | ORAL | 0 refills | Status: DC | PRN

## 2022-02-13 NOTE — Assessment & Plan Note (Signed)
Not consistent history or exam for strep throat. POC covid-19 testing done as still in window for treatment with other symptoms which is negative. Rx flonase and tessalon perles. Advised to call back next week if not improving.

## 2022-02-13 NOTE — Patient Instructions (Signed)
We have sent in tessalon perles for cough that you can take up to 3 times a day for cough.  We have sent in flonase which is a nose spray to use 2 sprays in each nostril in the evening to help.

## 2022-02-13 NOTE — Progress Notes (Signed)
   Subjective:   Patient ID: Deborah Schultz, female    DOB: 04/19/1946, 76 y.o.   MRN: [id number]  Sore Throat  Associated symptoms include congestion and coughing. Pertinent negatives include no ear discharge, ear pain, shortness of breath or trouble swallowing.   The patient is a 76 YO female coming in for sick visit.  Review of Systems  Constitutional:  Positive for activity change, appetite change and chills. Negative for fatigue, fever and unexpected weight change.  HENT:  Positive for congestion, postnasal drip and sore throat. Negative for ear discharge, ear pain, rhinorrhea, sinus pressure, sinus pain, sneezing, tinnitus, trouble swallowing and voice change.   Eyes: Negative.   Respiratory:  Positive for cough. Negative for chest tightness, shortness of breath and wheezing.   Cardiovascular: Negative.   Gastrointestinal: Negative.   Musculoskeletal:  Negative for myalgias.  Neurological: Negative.     Objective:  Physical Exam Constitutional:      Appearance: She is well-developed.  HENT:     Head: Normocephalic and atraumatic.     Comments: Oropharynx with redness and clear drainage, nose with swollen turbinates, TMs normal bilaterally.  Neck:     Thyroid: No thyromegaly.  Cardiovascular:     Rate and Rhythm: Normal rate and regular rhythm.  Pulmonary:     Effort: Pulmonary effort is normal. No respiratory distress.     Breath sounds: Normal breath sounds. No wheezing or rales.  Abdominal:     Palpations: Abdomen is soft.  Musculoskeletal:        General: Tenderness present.     Cervical back: Normal range of motion.  Lymphadenopathy:     Cervical: No cervical adenopathy.  Skin:    General: Skin is warm and dry.  Neurological:     Mental Status: She is alert and oriented to person, place, and time.     Vitals:   02/13/22 1059 02/13/22 1102  BP: (!) 160/80 (!) 160/80  Pulse: 62   Temp: 98.1 F (36.7 C)   TempSrc: Oral   SpO2: 98%   Weight: 176  lb (79.8 kg)   Height: 5\' 1"  (1.549 m)     Assessment & Plan:

## 2022-02-16 ENCOUNTER — Telehealth: Payer: Self-pay | Admitting: Internal Medicine

## 2022-02-16 NOTE — Telephone Encounter (Signed)
DECLINED AWV.... Please discuss with patient.

## 2022-02-20 ENCOUNTER — Ambulatory Visit (INDEPENDENT_AMBULATORY_CARE_PROVIDER_SITE_OTHER): Payer: PPO | Admitting: *Deleted

## 2022-02-20 DIAGNOSIS — Z23 Encounter for immunization: Secondary | ICD-10-CM | POA: Diagnosis not present

## 2022-02-21 ENCOUNTER — Encounter: Payer: Self-pay | Admitting: Internal Medicine

## 2022-03-09 ENCOUNTER — Encounter: Payer: Self-pay | Admitting: Internal Medicine

## 2022-03-23 DIAGNOSIS — Z01419 Encounter for gynecological examination (general) (routine) without abnormal findings: Secondary | ICD-10-CM | POA: Diagnosis not present

## 2022-03-23 DIAGNOSIS — Z6833 Body mass index (BMI) 33.0-33.9, adult: Secondary | ICD-10-CM | POA: Diagnosis not present

## 2022-04-18 ENCOUNTER — Other Ambulatory Visit: Payer: Self-pay | Admitting: Internal Medicine

## 2022-04-18 NOTE — Telephone Encounter (Signed)
Please refill as per office routine med refill policy (all routine meds to be refilled for 3 mo or monthly (per pt preference) up to one year from last visit, then month to month grace period for 3 mo, then further med refills will have to be denied) ? ?

## 2022-04-21 ENCOUNTER — Ambulatory Visit (INDEPENDENT_AMBULATORY_CARE_PROVIDER_SITE_OTHER): Payer: PPO

## 2022-04-21 ENCOUNTER — Ambulatory Visit (INDEPENDENT_AMBULATORY_CARE_PROVIDER_SITE_OTHER): Payer: PPO | Admitting: Family Medicine

## 2022-04-21 ENCOUNTER — Ambulatory Visit: Payer: Self-pay

## 2022-04-21 VITALS — BP 130/86 | HR 68 | Ht 61.0 in | Wt 175.0 lb

## 2022-04-21 DIAGNOSIS — M25561 Pain in right knee: Secondary | ICD-10-CM

## 2022-04-21 NOTE — Progress Notes (Signed)
Deborah Schultz, am serving as a Education administrator for Dr. [name].  Deborah Schultz is a 77 y.o. female who presents to [facility] today for right knee pain and popping. Patient states that this has been going on since Sunday, patient was dancing with her husband when she felt like her knee went in two different directions. Patient had to sit down immidetly and since then it has been hard to do any bending or lifting of her leg. She has noticed the right hip starting to bother her. Patient tried Advil to help but didn't help, Voltaren gel has eased the pain but the pain comes right back.  Patient locates pain to lateral right knee but radiates throughout the knee.    Mechanical symptoms: no Weakness: yes  Aggravates: bending, sitting to standing, lifting the leg.      Pertinent review of systems: No fevers or chills  Relevant historical information: Hypertension.  Renal artery stenosis.  CKD.   Exam:  BP 130/86   Pulse 68   Ht 5\' 1"  (1.549 m)   Wt 175 lb (79.4 kg)   SpO2 98%   BMI 33.07 kg/m  General: Well Developed, well nourished, and in no acute distress.   MSK: Right knee moderate effusion decreased range of motion.  Tender palpation medial joint line. Stable ligamentous exam however guarding is present with exam testing therefore nondiagnostic.  Intact strength.  Antalgic gait. McMurray's testing also confounded by guarding nondiagnostic.    Lab and Radiology Results  Procedure: Real-time Ultrasound Guided Injection of right knee superior lateral patellar space Device: Philips Affiniti 50G Images permanently stored and available for review in PACS Ultrasound evaluation prior to injection shows small joint effusion.  Degenerative changes present on medial and lateral joint line worse in the medial joint line.  The medial meniscus appears to be degenerative appearing Small Baker's cyst is present. Verbal informed consent obtained.   Discussed risks and benefits of procedure. Warned about infection, bleeding, hyperglycemia damage to structures among others. Patient expresses understanding and agreement Time-out conducted.   Noted no overlying erythema, induration, or other signs of local infection.   Skin prepped in a sterile fashion.   Local anesthesia: Topical Ethyl chloride.   With sterile technique and under real time ultrasound guidance: 40 mg of Kenalog and 2 mL of Marcaine injected into knee joint. Fluid seen entering the joint capsule.   Completed without difficulty   Pain immediately resolved suggesting accurate placement of the medication.   Advised to call if fevers/chills, erythema, induration, drainage, or persistent bleeding.   Images permanently stored and available for review in the ultrasound unit.  Impression: Technically successful ultrasound guided injection.    X-ray images right knee obtained today personally and independently interpreted. No acute fractures are visible.  Mild degenerative changes present. Await formal radiology review.    Assessment and Plan: 77 y.o. female with acute right knee pain occurring while dancing.  This is concerning for medial meniscus injury.  Plan for steroid injection and relative rest.  Recheck in 1 month.  Return sooner if needed.   PDMP not reviewed this encounter. Orders Placed This Encounter  Procedures   [place] LIMITED JOINT SPACE STRUCTURES LOW RIGHT(NO LINKED CHARGES)    Standing Status:   Future    Number of Occurrences:   1    Standing Expiration Date:   04/22/2023    Order Specific Question:   Reason for Exam (SYMPTOM  OR DIAGNOSIS  REQUIRED)    Answer:   right knee pain    Order Specific Question:   Preferred imaging location?    Answer:   [facility]   DG Knee 4 Views W/Patella Right    Standing Status:   Future    Number of Occurrences:   1    Standing Expiration Date:   04/22/2023    Order Specific Question:   Reason for  Exam (SYMPTOM  OR DIAGNOSIS REQUIRED)    Answer:   eval knee pain trauam views including oblique    Order Specific Question:   Preferred imaging location?    Answer:   [facility]   No orders of the defined types were placed in this encounter.    Discussed warning signs or symptoms. Please see discharge instructions. Patient expresses understanding.   The above documentation has been reviewed and is accurate and complete [name], M.D.

## 2022-04-21 NOTE — Patient Instructions (Signed)
Thank you for coming in today.    Call or go to the ER if you develop a large red swollen joint with extreme pain or oozing puss.    Please use Voltaren gel (Generic Diclofenac Gel) up to 4x daily for pain as needed.  This is available over-the-counter as both the name brand Voltaren gel and the generic diclofenac gel.   Recheck in 1 month.   Let me know if you are not doing well.

## 2022-04-22 NOTE — Progress Notes (Signed)
Right knee x-ray shows some arthritis changes.  No fractures are seen.

## 2022-05-02 ENCOUNTER — Other Ambulatory Visit: Payer: Self-pay | Admitting: Internal Medicine

## 2022-05-02 NOTE — Telephone Encounter (Signed)
Please refill as per office routine med refill policy (all routine meds to be refilled for 3 mo or monthly (per pt preference) up to one year from last visit, then month to month grace period for 3 mo, then further med refills will have to be denied)

## 2022-05-10 ENCOUNTER — Encounter: Payer: Self-pay | Admitting: Internal Medicine

## 2022-05-10 DIAGNOSIS — E538 Deficiency of other specified B group vitamins: Secondary | ICD-10-CM

## 2022-05-10 DIAGNOSIS — E7800 Pure hypercholesterolemia, unspecified: Secondary | ICD-10-CM

## 2022-05-10 DIAGNOSIS — R739 Hyperglycemia, unspecified: Secondary | ICD-10-CM

## 2022-05-10 DIAGNOSIS — E559 Vitamin D deficiency, unspecified: Secondary | ICD-10-CM

## 2022-05-11 NOTE — Telephone Encounter (Signed)
Please place orders for labs.

## 2022-05-11 NOTE — Telephone Encounter (Signed)
Ok labs are ordered 

## 2022-05-13 ENCOUNTER — Other Ambulatory Visit (INDEPENDENT_AMBULATORY_CARE_PROVIDER_SITE_OTHER): Payer: PPO

## 2022-05-13 DIAGNOSIS — R739 Hyperglycemia, unspecified: Secondary | ICD-10-CM | POA: Diagnosis not present

## 2022-05-13 DIAGNOSIS — E538 Deficiency of other specified B group vitamins: Secondary | ICD-10-CM

## 2022-05-13 DIAGNOSIS — E559 Vitamin D deficiency, unspecified: Secondary | ICD-10-CM | POA: Diagnosis not present

## 2022-05-13 DIAGNOSIS — E7800 Pure hypercholesterolemia, unspecified: Secondary | ICD-10-CM

## 2022-05-13 LAB — CBC WITH DIFFERENTIAL/PLATELET
Basophils Absolute: 0.1 10*3/uL (ref 0.0–0.1)
Basophils Relative: 0.9 % (ref 0.0–3.0)
Eosinophils Absolute: 0.2 10*3/uL (ref 0.0–0.7)
Eosinophils Relative: 2.1 % (ref 0.0–5.0)
HCT: 39.6 % (ref 36.0–46.0)
Hemoglobin: 13.3 g/dL (ref 12.0–15.0)
Lymphocytes Relative: 27.2 % (ref 12.0–46.0)
Lymphs Abs: 2 10*3/uL (ref 0.7–4.0)
MCHC: 33.6 g/dL (ref 30.0–36.0)
MCV: 91.8 fl (ref 78.0–100.0)
Monocytes Absolute: 0.8 10*3/uL (ref 0.1–1.0)
Monocytes Relative: 10.8 % (ref 3.0–12.0)
Neutro Abs: 4.2 10*3/uL (ref 1.4–7.7)
Neutrophils Relative %: 59 % (ref 43.0–77.0)
Platelets: 244 10*3/uL (ref 150.0–400.0)
RBC: 4.32 Mil/uL (ref 3.87–5.11)
RDW: 14 % (ref 11.5–15.5)
WBC: 7.2 10*3/uL (ref 4.0–10.5)

## 2022-05-13 LAB — URINALYSIS, ROUTINE W REFLEX MICROSCOPIC
Bilirubin Urine: NEGATIVE
Hgb urine dipstick: NEGATIVE
Ketones, ur: NEGATIVE
Leukocytes,Ua: NEGATIVE
Nitrite: NEGATIVE
Specific Gravity, Urine: 1.015 (ref 1.000–1.030)
Total Protein, Urine: NEGATIVE
Urine Glucose: NEGATIVE
Urobilinogen, UA: 0.2 (ref 0.0–1.0)
pH: 6 (ref 5.0–8.0)

## 2022-05-13 LAB — BASIC METABOLIC PANEL
BUN: 15 mg/dL (ref 6–23)
CO2: 27 mEq/L (ref 19–32)
Calcium: 9.4 mg/dL (ref 8.4–10.5)
Chloride: 102 mEq/L (ref 96–112)
Creatinine, Ser: 0.94 mg/dL (ref 0.40–1.20)
GFR: 59.59 mL/min — ABNORMAL LOW (ref >60.00)
Glucose, Bld: 101 mg/dL — ABNORMAL HIGH (ref 70–99)
Potassium: 3.5 mEq/L (ref 3.5–5.1)
Sodium: 138 mEq/L (ref 135–145)

## 2022-05-13 LAB — HEPATIC FUNCTION PANEL
ALT: 22 U/L (ref 0–35)
AST: 22 U/L (ref 0–37)
Albumin: 4 g/dL (ref 3.5–5.2)
Alkaline Phosphatase: 68 U/L (ref 39–117)
Bilirubin, Direct: 0.1 mg/dL (ref 0.0–0.3)
Total Bilirubin: 0.5 mg/dL (ref 0.2–1.2)
Total Protein: 6.9 g/dL (ref 6.0–8.3)

## 2022-05-13 LAB — LIPID PANEL
Cholesterol: 184 mg/dL (ref 0–200)
HDL: 65.3 mg/dL (ref >39.00)
LDL Cholesterol: 97 mg/dL (ref 0–99)
NonHDL: 118.56
Total CHOL/HDL Ratio: 3
Triglycerides: 109 mg/dL (ref 0.0–149.0)
VLDL: 21.8 mg/dL (ref 0.0–40.0)

## 2022-05-13 LAB — TSH: TSH: 2.91 u[IU]/mL (ref 0.35–5.50)

## 2022-05-13 LAB — VITAMIN B12: Vitamin B-12: >1500 pg/mL — ABNORMAL HIGH (ref 211–911)

## 2022-05-13 LAB — VITAMIN D 25 HYDROXY (VIT D DEFICIENCY, FRACTURES): VITD: 48.66 ng/mL (ref 30.00–100.00)

## 2022-05-13 LAB — HEMOGLOBIN A1C: Hgb A1c MFr Bld: 6 % (ref 4.6–6.5)

## 2022-05-14 ENCOUNTER — Ambulatory Visit (INDEPENDENT_AMBULATORY_CARE_PROVIDER_SITE_OTHER): Payer: PPO | Admitting: Internal Medicine

## 2022-05-14 VITALS — BP 134/78 | HR 62 | Temp 97.9°F | Ht 61.0 in | Wt 173.0 lb

## 2022-05-14 DIAGNOSIS — Z1211 Encounter for screening for malignant neoplasm of colon: Secondary | ICD-10-CM | POA: Diagnosis not present

## 2022-05-14 DIAGNOSIS — Z0001 Encounter for general adult medical examination with abnormal findings: Secondary | ICD-10-CM | POA: Diagnosis not present

## 2022-05-14 DIAGNOSIS — M1990 Unspecified osteoarthritis, unspecified site: Secondary | ICD-10-CM | POA: Insufficient documentation

## 2022-05-14 DIAGNOSIS — E7800 Pure hypercholesterolemia, unspecified: Secondary | ICD-10-CM

## 2022-05-14 DIAGNOSIS — I10 Essential (primary) hypertension: Secondary | ICD-10-CM

## 2022-05-14 DIAGNOSIS — R739 Hyperglycemia, unspecified: Secondary | ICD-10-CM

## 2022-05-14 DIAGNOSIS — N1831 Chronic kidney disease, stage 3a: Secondary | ICD-10-CM

## 2022-05-14 DIAGNOSIS — E2839 Other primary ovarian failure: Secondary | ICD-10-CM | POA: Diagnosis not present

## 2022-05-14 MED ORDER — TELMISARTAN 80 MG PO TABS: 80.0000 mg | ORAL_TABLET | Freq: Every day | ORAL | 3 refills | Status: DC

## 2022-05-14 NOTE — Patient Instructions (Addendum)
Please have your Shingrix (shingles) shots done at your local pharmacy.  Please schedule the bone density test before leaving today at the scheduling desk (where you check out)  Please continue all other medications as before, and refills have been done if requested - the micardis  Please have the pharmacy call with any other refills you may need.  Please continue your efforts at being more active, low cholesterol diet, and weight control.  You are otherwise up to date with prevention measures today.  Please keep your appointments with your specialists as you may have planned  You will be contacted regarding the referral for: colonoscopy  Please make an Appointment to return in 6 months, or sooner if needed

## 2022-05-14 NOTE — Progress Notes (Signed)
Patient ID: Deborah Schultz, female   DOB: [birth year], 77 y.o.   MRN: [id number]         Chief Complaint:: wellness exam and htn, hyperglycemia, hld, ckd,        HPI:  Deborah Schultz is a 77 y.o. female here for wellness exam, due for f/u DXA, also colonsocpy after July 2024, will have shingrix at pharmacy, o/w up to date                        Also Pt denies chest pain, increased sob or doe, wheezing, orthopnea, PND, increased LE swelling, palpitations, dizziness or syncope.   Pt denies polydipsia, polyuria, or new focal neuro s/s.    Pt denies fever, wt loss, night sweats, loss of appetite, or other constitutional symptoms  BP at home has been < 140/90  Trying to follow lower chol diet.    Wt Readings from Last 3 Encounters:  05/14/22 173 lb (78.5 kg)  04/21/22 175 lb (79.4 kg)  02/13/22 176 lb (79.8 kg)   BP Readings from Last 3 Encounters:  05/14/22 134/78  04/21/22 130/86  02/13/22 (!) 160/80   Immunization History  Administered Date(s) Administered   COVID-19, mRNA, vaccine(Comirnaty)12 years and older 01/29/2022   Fluad Quad(high Dose 65+) 12/23/2018, 03/02/2020, 02/26/2021, 02/20/2022   Influenza Split 02/29/2012   Influenza Whole 01/19/2008   Influenza, High Dose Seasonal PF 02/02/2017, 12/16/2017   Influenza-Unspecified 02/20/2022   PFIZER(Purple Top)SARS-COV-2 Vaccination 05/12/2019, 06/02/2019, 01/18/2020, 02/10/2021   Pneumococcal Conjugate-13 10/22/2016   Pneumococcal Polysaccharide-23 10/26/2017   Pneumococcal-Unspecified 10/26/2017   Td 06/06/2008   Tdap 09/19/2018   Unspecified SARS-COV-2 Vaccination 05/12/2019, 06/02/2019, 01/18/2020, 02/10/2021, 01/29/2022   Zoster, Live 06/06/2008  There are no preventive care reminders to display for this patient.    Past Medical History:  Diagnosis Date   ALOPECIA 06/06/2008   Anemia    iron deficiency   ANEMIA-IRON DEFICIENCY 06/06/2008   BICEPS TENDINITIS, LEFT 05/06/2010   BRONCHITIS, ACUTE 06/18/2008    CALLUSES, FEET, BILATERAL 06/06/2008   Chronic pruritus    CONSTIPATION 06/06/2008   Cough 06/18/2008   Degenerative disc disease, cervical    Diverticulitis    DIVERTICULITIS, HX OF 06/06/2008   ELBOW PAIN 05/26/2010   FATIGUE 06/18/2008   HLD (hyperlipidemia)    HTN (hypertension)    HYPERLIPIDEMIA 06/06/2008   OAB (overactive bladder)    OVERACTIVE BLADDER 06/06/2008   PERIPHERAL EDEMA 02/19/2009   Peripheral edema 02/26/2011   Right carpal tunnel syndrome    SINUSITIS- ACUTE-NOS 01/08/2009   Past Surgical History:  Procedure Laterality Date   ABDOMINAL HYSTERECTOMY     APPENDECTOMY     BREAST BIOPSY     BUNIONECTOMY     CATARACT EXTRACTION     bilateral 11/2009-no longer wears glasses   ECTOPIC PREGNANCY SURGERY     OOPHORECTOMY     right thumb surgery      reports that she has never smoked. She has never used smokeless tobacco. She reports current alcohol use. She reports that she does not use drugs. family history includes Cancer in her maternal aunt and maternal uncle; Diabetes in her brother; Hypertension in her mother; Ovarian cancer in her mother. Allergies  Allergen Reactions   Nystatin Rash   Sulfa Antibiotics Anaphylaxis    Per pt: unknown reaction   Esomeprazole Magnesium     REACTION: Headache   Fexofenadine     REACTION: Hives   Sulfonamide Derivatives  Per pt: unknown reaction   Current Outpatient Medications on File Prior to Visit  Medication Sig Dispense Refill   amLODipine (NORVASC) 5 MG tablet Take 1 tablet by mouth once daily 90 tablet 0   aspirin 81 MG tablet Take 81 mg by mouth daily.       cholecalciferol (VITAMIN D3) 25 MCG (1000 UNIT) tablet Take 1,000 Units by mouth daily.     COVID-19 mRNA vaccine 2023-2024 (COMIRNATY) SUSP injection Inject into the muscle. 0.3 mL 0   estradiol (ESTRACE) 1 MG tablet Take 1 tablet by mouth once daily 90 tablet 2   furosemide (LASIX) 40 MG tablet TAKE 1 TABLET BY MOUTH TWICE DAILY, WITH THE SECOND DOSE ONLY AS  NEEDED FOR PERSISTENT SWELLING. 180 tablet 0   Multiple Vitamin (MULTIVITAMIN) tablet Take 1 tablet by mouth daily.       oxybutynin (DITROPAN) 5 MG tablet Take 1 tablet by mouth once daily 90 tablet 0   pravastatin (PRAVACHOL) 40 MG tablet Take 1 tablet by mouth once daily 90 tablet 2   propranolol ER (INDERAL LA) 80 MG 24 hr capsule Take 1 capsule by mouth once daily 90 capsule 2   triamcinolone cream (KENALOG) 0.1 % Apply 1 application topically 2 (two) times daily. 30 g 0   Cholecalciferol (VITAMIN D3) 25 MCG (1000 UT) CAPS 1 capsule every day by oral route.     Hydrocortisone 2.5 % KIT APPLY ON THE SKIN AS DIRECTED USE 2 3 TIMES A DAY FOR 2 WEEKS ON NOSE     No current facility-administered medications on file prior to visit.        ROS:  All others reviewed and negative.  Objective        PE:  BP 134/78 (BP Location: Right Arm, Patient Position: Sitting, Cuff Size: Large)   Pulse 62   Temp 97.9 F (36.6 C) (Oral)   Ht 5\' 1"  (1.549 m)   Wt 173 lb (78.5 kg)   SpO2 96%   BMI 32.69 kg/m                 Constitutional: Pt appears in NAD               HENT: Head: NCAT.                Right Ear: External ear normal.                 Left Ear: External ear normal.                Eyes: . Pupils are equal, round, and reactive to light. Conjunctivae and EOM are normal               Nose: without d/c or deformity               Neck: Neck supple. Gross normal ROM               Cardiovascular: Normal rate and regular rhythm.                 Pulmonary/Chest: Effort normal and breath sounds without rales or wheezing.                Abd:  Soft, NT, ND, + BS, no organomegaly               Neurological: Pt is alert. At baseline orientation, motor grossly intact  Skin: Skin is warm. No rashes, no other new lesions, LE edema - none               Psychiatric: Pt behavior is normal without agitation   Micro: none  Cardiac tracings I have personally interpreted today:   none  Pertinent Radiological findings (summarize): none   Lab Results  Component Value Date   WBC 7.2 05/13/2022   HGB 13.3 05/13/2022   HCT 39.6 05/13/2022   PLT 244.0 05/13/2022   GLUCOSE 101 (H) 05/13/2022   CHOL 184 05/13/2022   TRIG 109.0 05/13/2022   HDL 65.30 05/13/2022   LDLCALC 97 05/13/2022   ALT 22 05/13/2022   AST 22 05/13/2022   NA 138 05/13/2022   K 3.5 05/13/2022   CL 102 05/13/2022   CREATININE 0.94 05/13/2022   BUN 15 05/13/2022   CO2 27 05/13/2022   TSH 2.91 05/13/2022   HGBA1C 6.0 05/13/2022   Assessment/Plan:  Deborah Schultz is a 77 y.o. Black or African American [2] female with  has a past medical history of ALOPECIA (06/06/2008), Anemia, ANEMIA-IRON DEFICIENCY (06/06/2008), BICEPS TENDINITIS, LEFT (05/06/2010), BRONCHITIS, ACUTE (06/18/2008), CALLUSES, FEET, BILATERAL (06/06/2008), Chronic pruritus, CONSTIPATION (06/06/2008), Cough (06/18/2008), Degenerative disc disease, cervical, Diverticulitis, DIVERTICULITIS, HX OF (06/06/2008), ELBOW PAIN (05/26/2010), FATIGUE (06/18/2008), HLD (hyperlipidemia), HTN (hypertension), HYPERLIPIDEMIA (06/06/2008), OAB (overactive bladder), OVERACTIVE BLADDER (06/06/2008), PERIPHERAL EDEMA (02/19/2009), Peripheral edema (02/26/2011), Right carpal tunnel syndrome, and SINUSITIS- ACUTE-NOS (01/08/2009).  Encounter for well adult exam with abnormal findings Age and sex appropriate education and counseling updated with regular exercise and diet Referrals for preventative services - for DXA follow up, colonoscopy after July 2024 Immunizations addressed - for shingrix at pharmacy Smoking counseling  - none needed Evidence for depression or other mood disorder - none significant Most recent labs reviewed. I have personally reviewed and have noted: 1) the patient's medical and social history 2) The patient's current medications and supplements 3) The patient's height, weight, and BMI have been recorded in the chart   CKD (chronic kidney  disease) stage 3, GFR 30-59 ml/min (HCC) Lab Results  Component Value Date   CREATININE 0.94 05/13/2022   Stable overall, cont to avoid nephrotoxins   Essential hypertension BP Readings from Last 3 Encounters:  05/14/22 134/78  04/21/22 130/86  02/13/22 (!) 160/80   Uncontrolled, but pt states ok at home,, pt to continue medical treatment norvasc 5 mg qd as declines change today   Hyperlipidemia Lab Results  Component Value Date   LDLCALC 97 05/13/2022   Stable, pt to continue current statin pravachol 40 mg qd   Estrogen deficiency For DXA  Hyperglycemia Lab Results  Component Value Date   HGBA1C 6.0 05/13/2022   Stable, pt to continue current medical treatment  - diet, wt control, excercise  Followup: Return in about 6 months (around 11/12/2022).  Oliver Barre, MD 05/18/2022 5:03 AM [facility]

## 2022-05-18 ENCOUNTER — Encounter: Payer: Self-pay | Admitting: Internal Medicine

## 2022-05-18 ENCOUNTER — Ambulatory Visit (INDEPENDENT_AMBULATORY_CARE_PROVIDER_SITE_OTHER)
Admission: RE | Admit: 2022-05-18 | Discharge: 2022-05-18 | Disposition: A | Discharge status: Home / Self Care | Payer: PPO | Source: Ambulatory Visit | Attending: Internal Medicine | Admitting: Internal Medicine

## 2022-05-18 ENCOUNTER — Inpatient Hospital Stay: Admission: RE | Admit: 2022-05-18 | Payer: PPO | Source: Ambulatory Visit

## 2022-05-18 DIAGNOSIS — R739 Hyperglycemia, unspecified: Secondary | ICD-10-CM | POA: Insufficient documentation

## 2022-05-18 DIAGNOSIS — E2839 Other primary ovarian failure: Secondary | ICD-10-CM | POA: Diagnosis not present

## 2022-05-18 NOTE — Assessment & Plan Note (Signed)
Lab Results  Component Value Date   HGBA1C 6.0 05/13/2022   Stable, pt to continue current medical treatment  - diet, wt control, excercise

## 2022-05-18 NOTE — Assessment & Plan Note (Signed)
Age and sex appropriate education and counseling updated with regular exercise and diet Referrals for preventative services - for DXA follow up, colonoscopy after July 2024 Immunizations addressed - for shingrix at pharmacy Smoking counseling  - none needed Evidence for depression or other mood disorder - none significant Most recent labs reviewed. I have personally reviewed and have noted: 1) the patient's medical and social history 2) The patient's current medications and supplements 3) The patient's height, weight, and BMI have been recorded in the chart

## 2022-05-18 NOTE — Assessment & Plan Note (Signed)
Lab Results  Component Value Date   CREATININE 0.94 05/13/2022   Stable overall, cont to avoid nephrotoxins

## 2022-05-18 NOTE — Assessment & Plan Note (Signed)
BP Readings from Last 3 Encounters:  05/14/22 134/78  04/21/22 130/86  02/13/22 (!) 160/80   Uncontrolled, but pt states ok at home,, pt to continue medical treatment norvasc 5 mg qd as declines change today

## 2022-05-18 NOTE — Assessment & Plan Note (Signed)
Lab Results  Component Value Date   LDLCALC 97 05/13/2022   Stable, pt to continue current statin pravachol 40 mg qd

## 2022-05-18 NOTE — Assessment & Plan Note (Signed)
For DXA 

## 2022-05-20 NOTE — Progress Notes (Deleted)
   I, [name], LAT, ATC acting as a scribe for [name], MD.  Deborah Schultz is a 77 y.o. female who presents to [place] at [facility] today for 69-month f/u R knee pain that she injured dancing w/ her husband. Pt was last seen by Dr. Georgina Snell on 04/21/22 and was given a R knee steroid injection and was advised to rest. Today, pt reports  Dx imaging: 04/21/22 R knee XR  11/23/20 R tib/fib XR  Pertinent review of systems: ***  Relevant historical information: ***   Exam:  There were no vitals taken for this visit. General: Well Developed, well nourished, and in no acute distress.   MSK: ***    Lab and Radiology Results No results found for this or any previous visit (from the past 72 hour(s)). No results found.     Assessment and Plan: 77 y.o. female with ***   PDMP not reviewed this encounter. No orders of the defined types were placed in this encounter.  No orders of the defined types were placed in this encounter.    Discussed warning signs or symptoms. Please see discharge instructions. Patient expresses understanding.   ***

## 2022-05-21 ENCOUNTER — Ambulatory Visit: Payer: PPO | Admitting: Family Medicine

## 2022-05-22 NOTE — Progress Notes (Unsigned)
   I, [name], LAT, ATC acting as a scribe for Deborah Leader, MD.  Deborah Schultz is a 77 y.o. female who presents to [place] at [facility] today for 45-month f/u R knee pain that she injured dancing w/ her husband. Pt was last seen by Dr. Georgina Snell on 04/21/22 and was given a R knee steroid injection and was advised to rest. Today, pt reports  Dx imaging: 04/21/22 R knee XR  11/23/20 R tib/fib XR   Pertinent review of systems: ***  Relevant historical information: ***   Exam:  There were no vitals taken for this visit. General: Well Developed, well nourished, and in no acute distress.   MSK: ***    Lab and Radiology Results No results found for this or any previous visit (from the past 72 hour(s)). DG Bone Density  Result Date: 05/21/2022 Table formatting from the original result was not included. Date of study: 05/18/2022 Exam: DUAL X-RAY ABSORPTIOMETRY (DXA) FOR BONE MINERAL DENSITY (BMD) Instrument: [employer] Requesting Provider: PCP Indication: follow up for low BMD Comparison: 2018 ( Clinical data: Pt is a 77 y.o. female without previous history of fracture. Results:  Lumbar spine L1-L4 Femoral neck (FN) T-score +3.3 RFN: -0.7 LFN: -1.2 Change in BMD from previous DXA test (%) Up 5.0%* Down 0.2% (*) statistically significant Assessment: By the [facility] Criteria for diagnosis based on bone density, this patient has Low Bone Density FRAX 10-year fracture risk calculator: 4.3 % for any major fracture and 0.7 % for hip fracture. Pharmacologic therapy is recommended if 10 year fracture risk is >20% for any major osteoporotic fracture or >3% for hip fracture.  Comments: the technical quality of the study is good. WHO criteria for diagnosis of osteoporosis in postmenopausal women and in men 81 y/o or older: - normal: T-score -1.0 to + 1.0 - osteopenia/low bone density: T-score between -2.5 and -1.0 - osteoporosis: T-score below -2.5 - severe osteoporosis: T-score  below -2.5 with history of fragility fracture Note: although not part of the WHO classification, the presence of a fragility fracture, regardless of the T-score, should be considered diagnostic of osteoporosis, provided other causes for the fracture have been excluded. RECOMMENDATION: 1. All patients should optimize calcium and vitamin D intake. 2. Consider FDA-approved medical therapies in postmenopausal women and men aged 42 years and older, based on the following: a. A hip or vertebral(clinical or morphometric) fracture. b. T-Score of  -2.5 or less at the femoral neck , total hip or spine after appropriate evaluation to exclude secondary causes c. Low bone mass (T-score between -1.0 and -2.5 at the femoral neck or spine) and a 10 year probability of a hip fracture >3% or a 10 year probability of major osteoporosis-related fracture > 20% based on the [place]-adapted WHO algorithm d. Clinical judgement and/or patient preferences may indicate treatment for people with 10-year fracture probabilities above or below these levels Follow up BMD is recommended: 2 years. Interpreted by : Mack Guise, MD [facility]       Assessment and Plan: 77 y.o. female with ***   PDMP not reviewed this encounter. No orders of the defined types were placed in this encounter.  No orders of the defined types were placed in this encounter.    Discussed warning signs or symptoms. Please see discharge instructions. Patient expresses understanding.   ***

## 2022-05-25 ENCOUNTER — Ambulatory Visit (INDEPENDENT_AMBULATORY_CARE_PROVIDER_SITE_OTHER): Payer: PPO | Admitting: Family Medicine

## 2022-05-25 VITALS — BP 148/88 | HR 63 | Ht 61.0 in | Wt 171.0 lb

## 2022-05-25 DIAGNOSIS — M25561 Pain in right knee: Secondary | ICD-10-CM | POA: Diagnosis not present

## 2022-05-25 DIAGNOSIS — M85851 Other specified disorders of bone density and structure, right thigh: Secondary | ICD-10-CM

## 2022-05-25 DIAGNOSIS — G8929 Other chronic pain: Secondary | ICD-10-CM | POA: Insufficient documentation

## 2022-05-25 NOTE — Patient Instructions (Signed)
Thank you for coming in today.   Recheck as needed.   Stay active.  I can redo the injection about 3 months if needed.

## 2022-07-09 ENCOUNTER — Encounter: Payer: Self-pay | Admitting: Internal Medicine

## 2022-07-09 NOTE — Progress Notes (Signed)
Subjective:    Patient ID: Deborah Schultz, female    DOB: 1946/04/01, 77 y.o.   MRN: [id number]      HPI [facility] is here for  Chief Complaint  Patient presents with   Hypertension    Patient reports fluxiacation in her BP. She also has been feeling very tired and some shortness of breath     BP issues - BP fluctuating at home and she has been experiencing elevated heart rate and feeling winded when she walks up an incline.  She thinks that started last year, but has become more frequent.  She does not exercise on a regular basis.  SBP at home 120-150   DBP 70-80's    Has leg swelling - takes 80 mg lasix in am.  This is stable.   Medications and allergies reviewed with patient and updated if appropriate.  Current Outpatient Medications on File Prior to Visit  Medication Sig Dispense Refill   amLODipine (NORVASC) 5 MG tablet Take 1 tablet by mouth once daily 90 tablet 0   aspirin 81 MG tablet Take 81 mg by mouth daily.       cholecalciferol (VITAMIN D3) 25 MCG (1000 UNIT) tablet Take 1,000 Units by mouth daily.     estradiol (ESTRACE) 1 MG tablet Take 1 tablet by mouth once daily 90 tablet 2   furosemide (LASIX) 40 MG tablet TAKE 1 TABLET BY MOUTH TWICE DAILY, WITH THE SECOND DOSE ONLY AS NEEDED FOR PERSISTENT SWELLING. 180 tablet 0   Multiple Vitamin (MULTIVITAMIN) tablet Take 1 tablet by mouth daily.       oxybutynin (DITROPAN) 5 MG tablet Take 1 tablet by mouth once daily 90 tablet 0   pravastatin (PRAVACHOL) 40 MG tablet Take 1 tablet by mouth once daily 90 tablet 2   propranolol ER (INDERAL LA) 80 MG 24 hr capsule Take 1 capsule by mouth once daily 90 capsule 2   telmisartan (MICARDIS) 80 MG tablet Take 1 tablet (80 mg total) by mouth daily. 90 tablet 3   triamcinolone cream (KENALOG) 0.1 % Apply 1 application topically 2 (two) times daily. 30 g 0   No current facility-administered medications on file prior to visit.    Review of Systems  Respiratory:   Positive for shortness of breath (when walking up incline). Negative for cough and wheezing.   Cardiovascular:  Positive for palpitations (when walking up incline) and leg swelling (down in morning -during day the swell). Negative for chest pain.  Neurological:  Negative for dizziness, light-headedness and headaches.       Objective:   Vitals:   07/10/22 0845  BP: (!) 150/80  Pulse: 68  Temp: 98.1 F (36.7 C)  SpO2: 99%   BP Readings from Last 3 Encounters:  07/10/22 (!) 150/80  05/25/22 (!) 148/88  05/14/22 134/78   Wt Readings from Last 3 Encounters:  07/10/22 169 lb (76.7 kg)  05/25/22 171 lb (77.6 kg)  05/14/22 173 lb (78.5 kg)   Body mass index is 31.93 kg/m.    Physical Exam Constitutional:      General: She is not in acute distress.    Appearance: Normal appearance.  HENT:     Head: Normocephalic and atraumatic.  Eyes:     Conjunctiva/sclera: Conjunctivae normal.  Cardiovascular:     Rate and Rhythm: Normal rate and regular rhythm.     Heart sounds: Normal heart sounds.  Pulmonary:     Effort: Pulmonary effort is normal. No respiratory distress.  Breath sounds: Normal breath sounds. No wheezing.  Musculoskeletal:     Cervical back: Neck supple.     Right lower leg: No edema.     Left lower leg: No edema.  Lymphadenopathy:     Cervical: No cervical adenopathy.  Skin:    General: Skin is warm and dry.     Findings: No rash.  Neurological:     Mental Status: She is alert. Mental status is at baseline.  Psychiatric:        Mood and Affect: Mood normal.        Behavior: Behavior normal.            Assessment & Plan:    See Problem List for Assessment and Plan of chronic medical problems.

## 2022-07-10 ENCOUNTER — Encounter: Payer: Self-pay | Admitting: Internal Medicine

## 2022-07-10 ENCOUNTER — Ambulatory Visit (INDEPENDENT_AMBULATORY_CARE_PROVIDER_SITE_OTHER): Payer: PPO | Admitting: Internal Medicine

## 2022-07-10 VITALS — BP 146/74 | HR 68 | Temp 98.1°F | Ht 61.0 in | Wt 169.0 lb

## 2022-07-10 DIAGNOSIS — I10 Essential (primary) hypertension: Secondary | ICD-10-CM

## 2022-07-10 DIAGNOSIS — R0609 Other forms of dyspnea: Secondary | ICD-10-CM | POA: Insufficient documentation

## 2022-07-10 DIAGNOSIS — R002 Palpitations: Secondary | ICD-10-CM

## 2022-07-10 MED ORDER — SPIRONOLACTONE 25 MG PO TABS: 12.5000 mg | ORAL_TABLET | Freq: Every day | ORAL | 3 refills | Status: DC

## 2022-07-10 MED ORDER — PROPRANOLOL HCL ER BEADS 80 MG PO CP24: 80.0000 mg | ORAL_CAPSULE | Freq: Every day | ORAL | Status: DC

## 2022-07-10 MED ORDER — PROPRANOLOL HCL ER 120 MG PO CP24: 120.0000 mg | ORAL_CAPSULE | Freq: Every day | ORAL | 5 refills | Status: DC

## 2022-07-10 NOTE — Assessment & Plan Note (Addendum)
New Experiencing palpitations and dyspnea on exertion with walking up an incline She is not exercising on a regular basis She denies symptoms when walking on a flat surface Symptoms could be related to deconditioning, but need to rule out cardiac ischemia, arrhythmia EKG today: Sinus rhythm with first-degree AV block at 60 bpm, possible LAE.  Compared to previous EKGs from 2015, 2013 first-degree AV block is new, nonspecific T wave abnormality no longer present. Will refer to cardiology for further ischemic workup

## 2022-07-10 NOTE — Assessment & Plan Note (Signed)
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

## 2022-07-10 NOTE — Patient Instructions (Addendum)
      EKG was done today.     Medications changes include :   start spironolactone 12.5 mg daily    A referral was ordered for Cardiology.     Someone will call you to schedule an appointment.

## 2022-07-18 ENCOUNTER — Other Ambulatory Visit: Payer: Self-pay | Admitting: Internal Medicine

## 2022-08-12 NOTE — Progress Notes (Signed)
Cardiology Office Note:    Date:  08/13/2022  ID:  Deborah, Schultz 10-03-1945, MRN [id number]  PCP:  Corwin Levins, MD  Cardiologist:  None  Electrophysiologist:  None   Referring MD: Pincus Sanes, MD   Chief Complaint/Reason for Referral: Palpitations and dyspnea on exertion, HTN  History of Present Illness:    Deborah Schultz is a 77 y.o. female with a history of palpitations and dyspnea on exertion. She is being seen for the evaluation of hypertension and DOE.   She was seen by Dr. Lawerance Bach on 07/10/2022 for hypertension. She stated her blood pressure was fluctuating at home. She also endorsed fatigue and shortness of breath.   Today, she is accompanied by her husband.   She complains of her blood pressure fluctuating. She monitors her blood pressure at home and has brought in a log with her. Overall her at home readings are normal. She states her monitor shows an alert for an irregular rhythm at times. She denies any symptoms when her blood pressure is high. She is compliant with 5 mg Amlodipine once daily, 80 mg Lasix once daily, 80 mg Propranolol once daily, 12.5 mg Spironolactone daily, and 80 mg Telmisartan daily.   She reports episodes of exertional fatigue and shortness of breath. Symptoms tend to resolve in minutes after stopping to rest. We discussed the option of a stress test for further evaluation.   She complains of bilateral lower extremity edema in her legs and ankles. She has tried compression socks but stopped using them due to them hurting her legs and being too long.   We discussed the results of her EKG today in comparison to her last EKG, 1st degree AV block.   She denies any palpitations, chest pain. No lightheadedness, headaches, syncope, orthopnea, or PND.  Past Medical History:  Diagnosis Date   ALOPECIA 06/06/2008   Anemia    iron deficiency   ANEMIA-IRON DEFICIENCY 06/06/2008   BICEPS TENDINITIS, LEFT 05/06/2010   BRONCHITIS,  ACUTE 06/18/2008   CALLUSES, FEET, BILATERAL 06/06/2008   Chronic pruritus    CONSTIPATION 06/06/2008   Cough 06/18/2008   Degenerative disc disease, cervical    Diverticulitis    DIVERTICULITIS, HX OF 06/06/2008   ELBOW PAIN 05/26/2010   FATIGUE 06/18/2008   HLD (hyperlipidemia)    HTN (hypertension)    HYPERLIPIDEMIA 06/06/2008   OAB (overactive bladder)    OVERACTIVE BLADDER 06/06/2008   PERIPHERAL EDEMA 02/19/2009   Peripheral edema 02/26/2011   Right carpal tunnel syndrome    SINUSITIS- ACUTE-NOS 01/08/2009    Past Surgical History:  Procedure Laterality Date   ABDOMINAL HYSTERECTOMY     APPENDECTOMY     BREAST BIOPSY     BUNIONECTOMY     CATARACT EXTRACTION     bilateral 11/2009-no longer wears glasses   ECTOPIC PREGNANCY SURGERY     OOPHORECTOMY     right thumb surgery      Current Medications: Current Meds  Medication Sig   amLODipine (NORVASC) 5 MG tablet Take 1 tablet by mouth once daily   aspirin 81 MG tablet Take 81 mg by mouth daily.     cholecalciferol (VITAMIN D3) 25 MCG (1000 UNIT) tablet Take 1,000 Units by mouth daily.   estradiol (ESTRACE) 1 MG tablet Take 1 tablet by mouth once daily   furosemide (LASIX) 40 MG tablet TAKE 1 TABLET BY MOUTH TWICE DAILY, WITH SECOND DOSE ONLY AS NEEDED FOR PERSISTENT SWELLING   Multiple Vitamin (MULTIVITAMIN) tablet  Take 1 tablet by mouth daily.     oxybutynin (DITROPAN) 5 MG tablet Take 1 tablet by mouth once daily   pravastatin (PRAVACHOL) 40 MG tablet Take 1 tablet by mouth once daily   propranolol (INNOPRAN XL) 80 MG 24 hr capsule Take 1 capsule (80 mg total) by mouth daily.   spironolactone (ALDACTONE) 25 MG tablet Take 0.5 tablets (12.5 mg total) by mouth daily.   telmisartan (MICARDIS) 80 MG tablet Take 1 tablet (80 mg total) by mouth daily.   triamcinolone cream (KENALOG) 0.1 % Apply 1 application topically 2 (two) times daily.     Allergies:   Nystatin, Sulfa antibiotics, Esomeprazole magnesium, Fexofenadine, and  Sulfonamide derivatives   Social History   Tobacco Use   Smoking status: Never   Smokeless tobacco: Never  Substance Use Topics   Alcohol use: Yes    Alcohol/week: 0.0 standard drinks of alcohol    Comment: rare   Drug use: No     Family History: The patient's family history includes Cancer in her maternal aunt and maternal uncle; Diabetes in her brother; Hypertension in her mother; Ovarian cancer in her mother. There is no history of Colon cancer or Breast cancer.  ROS:   Please see the history of present illness.    (+) Shortness of breath (exertional) (+) Fatigue (exertional) (+) Bilateral lower extremity peripheral edema All other systems reviewed and are negative.  EKGs/Labs/Other Studies Reviewed:    The following studies were reviewed today:  Lower Venous DVT Study 12/13/2020: Summary:  RIGHT:  - No evidence of common femoral vein obstruction.    LEFT:  - No evidence of deep vein thrombosis in the lower extremity. No indirect  evidence of obstruction proximal to the inguinal ligament.  - No cystic structure found in the popliteal fossa.   EKG:  EKG is personally reviewed.  08/13/2022: NSR. 1st degree AV block. PR interval 238 ms. Rate 60 bpm.  Imaging studies that I have independently reviewed today: n/a   Recent Labs: 11/11/2021: Pro B Natriuretic peptide (BNP) 66.0 05/13/2022: ALT 22; BUN 15; Creatinine, Ser 0.94; Hemoglobin 13.3; Platelets 244.0; Potassium 3.5; Sodium 138; TSH 2.91  Recent Lipid Panel    Component Value Date/Time   CHOL 184 05/13/2022 1103   TRIG 109.0 05/13/2022 1103   HDL 65.30 05/13/2022 1103   CHOLHDL 3 05/13/2022 1103   VLDL 21.8 05/13/2022 1103   LDLCALC 97 05/13/2022 1103    Physical Exam:    VS:  BP (!) 180/82   Pulse 60   Ht  (1.549 m)   Wt 169 lb 3.2 oz (76.7 kg)   SpO2 96%   BMI 31.97 kg/m     Wt Readings from Last 5 Encounters:  08/13/22 169 lb 3.2 oz (76.7 kg)  07/10/22 169 lb (76.7 kg)  05/25/22 171 lb  (77.6 kg)  05/14/22 173 lb (78.5 kg)  04/21/22 175 lb (79.4 kg)    Constitutional: No acute distress Eyes: sclera non-icteric, normal conjunctiva and lids ENMT: normal dentition, moist mucous membranes Cardiovascular: regular rhythm, normal rate, no murmur. S1 and S2 normal. No jugular venous distention.  Respiratory: clear to auscultation bilaterally GI : normal bowel sounds, soft and nontender. No distention.   MSK: extremities warm, well perfused. No edema.  NEURO: grossly nonfocal exam, moves all extremities. PSYCH: alert and oriented x 3, normal mood and affect.   ASSESSMENT:    1. Dyspnea on exertion   2. Primary hypertension   3. Palpitations  4. Bilateral leg edema    PLAN:    Dyspnea on exertion - Plan: EKG 12-Lead, ECHOCARDIOGRAM COMPLETE, CT CORONARY MORPH W/CTA COR W/SCORE W/CA W/CM &/OR WO/CM, Basic metabolic panel  Primary hypertension  Palpitations  Bilateral leg edema - Follow up after testing - Echo to further evaluate dyspnea on exertion.  Patient has a history of hypertension and is having some palpitations, we will evaluate for structural heart disease, diastolic function, and atrial size with palpitations.  Palpitations tend to primarily occur with exertion and sound less indicative of concerning arrhythmia.  Consider monitor at follow-up if symptoms persist. - Coronary CT to evaluate dyspnea on exertion and exercise intolerance.  Heart rate is optimized for CT imaging. -Lower extremity edema may be from venous insufficiency if echocardiogram unremarkable.  I have recommended pression socks and she may continue to take Lasix. -Blood pressure is overall reasonably managed with some blood pressures in the 100 systolic range and some blood pressures in the 140s over 70s as noted in the HPI.  She is currently on a multidrug regimen for hypertension that seems to be working reasonably well.  Will complete cardiac testing to tailor further therapy and recheck blood  pressure at follow-up.  Blood pressure significantly elevated in the clinic today but patient had taken her blood pressure twice earlier today readings are 124/71 and 147/81.  Clinic blood pressure likely spurious.   Total time of encounter: 45 minutes total time of encounter, including 30 minutes spent in face-to-face patient care on the date of this encounter. This time includes coordination of care and counseling regarding above mentioned problem list. Remainder of non-face-to-face time involved reviewing chart documents/testing relevant to the patient encounter and documentation in the medical record. I have independently reviewed documentation from referring provider.   Weston Brass, MD, [facility] [place]  [facility]   Shared Decision Making/Informed Consent:       Medication Adjustments/Labs and Tests Ordered: Current medicines are reviewed at length with the patient today.  Concerns regarding medicines are outlined above.   Orders Placed This Encounter  Procedures   CT CORONARY MORPH W/CTA COR W/SCORE W/CA W/CM &/OR WO/CM   Basic metabolic panel   EKG 12-Lead   ECHOCARDIOGRAM COMPLETE   No orders of the defined types were placed in this encounter.  Patient Instructions  Medication Instructions:  None Ordered At This Time.  *If you need a refill on your cardiac medications before your next appointment, please call your pharmacy*  Lab Work: Please return for Blood Work 1 WEEK PRIOR TO CCTA SCAN No appointment needed, lab here at the office is open Monday-Friday from 8AM to 4PM and closed daily for lunch from 12:45-1:45.   If you have labs (blood work) drawn today and your tests are completely normal, you will receive your results only by: MyChart Message (if you have MyChart) OR A paper copy in the mail If you have any lab test that is abnormal or we need to change your treatment, we will call you to review the results.  Testing/Procedures: Your physician has  requested that you have an echocardiogram. Echocardiography is a painless test that uses sound waves to create images of your heart. It provides your doctor with information about the size and shape of your heart and how well your heart's chambers and valves are working. You may receive an ultrasound enhancing agent through an IV if needed to better visualize your heart during the echo.This procedure takes approximately one hour. There are  no restrictions for this procedure. This will take place at the [address].     Your cardiac CT will be scheduled at one of the below locations:   [facility] [address] [place], [place] [address] [phone]  If scheduled at [facility], please arrive at the [facility] and Children's Entrance (Entrance C2) of [facility] 30 minutes prior to test start time. You can use the FREE valet parking offered at entrance C (encouraged to control the heart rate for the test)  Proceed to the [facility] Radiology Department (first floor) to check-in and test prep.  All radiology patients and guests should use entrance C2 at [facility], accessed from [facility], even though the hospital's physical address listed is [address].    Please follow these instructions carefully (unless otherwise directed):  Hold all erectile dysfunction medications at least 3 days (72 hrs) prior to test. (Ie viagra, cialis, sildenafil, tadalafil, etc) We will administer nitroglycerin during this exam.   On the Night Before the Test: Be sure to Drink plenty of water. Do not consume any caffeinated/decaffeinated beverages or chocolate 12 hours prior to your test. Do not take any antihistamines 12 hours prior to your test.  On the Day of the Test: Drink plenty of water until 1 hour prior to the test. Do not eat any food 1 hour prior to test. You may take your regular medications prior to the test.  If you  take Furosemide/Hydrochlorothiazide/Spironolactone, please HOLD on the morning of the test. FEMALES- please wear underwire-free bra if available, avoid dresses & tight clothing  After the Test: Drink plenty of water. After receiving IV contrast, you may experience a mild flushed feeling. This is normal. On occasion, you may experience a mild rash up to 24 hours after the test. This is not dangerous. If this occurs, you can take Benadryl 25 mg and increase your fluid intake. If you experience trouble breathing, this can be serious. If it is severe call 911 IMMEDIATELY. If it is mild, please call our office. If you take any of these medications: Glipizide/Metformin, Avandament, Glucavance, please do not take 48 hours after completing test unless otherwise instructed.  We will call to schedule your test 2-4 weeks out understanding that some insurance companies will need an authorization prior to the service being performed.   For non-scheduling related questions, please contact the cardiac imaging nurse navigator should you have any questions/concerns: Rockwell Alexandria, Cardiac Imaging Nurse Navigator Larey Brick, Cardiac Imaging Nurse Navigator [facility] Direct Office Dial: [phone]   For scheduling needs, including cancellations and rescheduling, please call [place], [phone].  Follow-Up: At [facility], you and your health needs are our priority.  As part of our continuing mission to provide you with exceptional heart care, we have created designated Provider Care Teams.  These Care Teams include your primary Cardiologist (physician) and Advanced Practice Providers (APPs -  Physician Assistants and Nurse Practitioners) who all work together to provide you with the care you need, when you need it.  Your next appointment:   6 -8 week(s)  Provider:   Dr. Cottie Banda Rivera,acting as a scribe for Parke Poisson, MD.,have  documented all relevant documentation on the behalf of Parke Poisson, MD,as directed by  Parke Poisson, MD while in the presence of Parke Poisson, MD.  Fredricka Bonine  Wynell Balloon, MD, have reviewed all documentation for the visit on 08/13/2022. The documentation on today's date of service for the exam, diagnosis, procedures, and orders are all accurate and complete.

## 2022-08-13 ENCOUNTER — Encounter: Payer: Self-pay | Admitting: Internal Medicine

## 2022-08-13 ENCOUNTER — Ambulatory Visit: Payer: PPO | Attending: Internal Medicine | Admitting: Internal Medicine

## 2022-08-13 VITALS — BP 180/82 | HR 60 | Ht 61.0 in | Wt 169.2 lb

## 2022-08-13 DIAGNOSIS — I10 Essential (primary) hypertension: Secondary | ICD-10-CM

## 2022-08-13 DIAGNOSIS — R600 Localized edema: Secondary | ICD-10-CM

## 2022-08-13 DIAGNOSIS — R002 Palpitations: Secondary | ICD-10-CM | POA: Diagnosis not present

## 2022-08-13 DIAGNOSIS — R0609 Other forms of dyspnea: Secondary | ICD-10-CM

## 2022-08-13 NOTE — Patient Instructions (Signed)
Medication Instructions:  None Ordered At This Time.  *If you need a refill on your cardiac medications before your next appointment, please call your pharmacy*  Lab Work: Please return for Blood Work 1 WEEK PRIOR TO CCTA SCAN No appointment needed, lab here at the office is open Monday-Friday from 8AM to 4PM and closed daily for lunch from 12:45-1:45.   If you have labs (blood work) drawn today and your tests are completely normal, you will receive your results only by: MyChart Message (if you have MyChart) OR A paper copy in the mail If you have any lab test that is abnormal or we need to change your treatment, we will call you to review the results.  Testing/Procedures: Your physician has requested that you have an echocardiogram. Echocardiography is a painless test that uses sound waves to create images of your heart. It provides your doctor with information about the size and shape of your heart and how well your heart's chambers and valves are working. You may receive an ultrasound enhancing agent through an IV if needed to better visualize your heart during the echo.This procedure takes approximately one hour. There are no restrictions for this procedure. This will take place at the [address], [address].     Your cardiac CT will be scheduled at one of the below locations:   [facility] [address] [place], [place] [address] [phone]  If scheduled at [facility], please arrive at the [facility] and Children's Entrance (Entrance C2) of [facility] 30 minutes prior to test start time. You can use the FREE valet parking offered at entrance C (encouraged to control the heart rate for the test)  Proceed to the [facility] Radiology Department (first floor) to check-in and test prep.  All radiology patients and guests should use entrance C2 at [facility], accessed from [facility], even though the hospital's physical address  listed is [address]    Please follow these instructions carefully (unless otherwise directed):  Hold all erectile dysfunction medications at least 3 days (72 hrs) prior to test. (Ie viagra, cialis, sildenafil, tadalafil, etc) We will administer nitroglycerin during this exam.   On the Night Before the Test: Be sure to Drink plenty of water. Do not consume any caffeinated/decaffeinated beverages or chocolate 12 hours prior to your test. Do not take any antihistamines 12 hours prior to your test.  On the Day of the Test: Drink plenty of water until 1 hour prior to the test. Do not eat any food 1 hour prior to test. You may take your regular medications prior to the test.  If you take Furosemide/Hydrochlorothiazide/Spironolactone, please HOLD on the morning of the test. FEMALES- please wear underwire-free bra if available, avoid dresses & tight clothing  After the Test: Drink plenty of water. After receiving IV contrast, you may experience a mild flushed feeling. This is normal. On occasion, you may experience a mild rash up to 24 hours after the test. This is not dangerous. If this occurs, you can take Benadryl 25 mg and increase your fluid intake. If you experience trouble breathing, this can be serious. If it is severe call 911 IMMEDIATELY. If it is mild, please call our office. If you take any of these medications: Glipizide/Metformin, Avandament, Glucavance, please do not take 48 hours after completing test unless otherwise instructed.  We will call to schedule your test 2-4 weeks out understanding that some insurance companies  will need an authorization prior to the service being performed.   For non-scheduling related questions, please contact the cardiac imaging nurse navigator should you have any questions/concerns: Rockwell Alexandria, Cardiac Imaging Nurse Navigator Larey Brick, Cardiac Imaging Nurse Navigator  Heart [facility] Services Direct Office  Dial: [phone]   For scheduling needs, including cancellations and rescheduling, please call [place], [phone].  Follow-Up: At [facility], you and your health needs are our priority.  As part of our continuing mission to provide you with exceptional heart care, we have created designated Provider Care Teams.  These Care Teams include your primary Cardiologist (physician) and Advanced Practice Providers (APPs -  Physician Assistants and Nurse Practitioners) who all work together to provide you with the care you need, when you need it.  Your next appointment:   6 -8 week(s)  Provider:   Dr. Jacques Navy

## 2022-08-15 ENCOUNTER — Other Ambulatory Visit: Payer: Self-pay | Admitting: Internal Medicine
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
## 2022-08-17 DIAGNOSIS — R0609 Other forms of dyspnea: Secondary | ICD-10-CM | POA: Diagnosis not present

## 2022-08-18 LAB — BASIC METABOLIC PANEL
BUN/Creatinine Ratio: 13 (ref 12–28)
BUN: 18 mg/dL (ref 8–27)
CO2: 26 mmol/L (ref 20–29)
Calcium: 10 mg/dL (ref 8.7–10.3)
Chloride: 101 mmol/L (ref 96–106)
Creatinine, Ser: 1.35 mg/dL — ABNORMAL HIGH (ref 0.57–1.00)
Glucose: 98 mg/dL (ref 70–99)
Potassium: 4.5 mmol/L (ref 3.5–5.2)
Sodium: 141 mmol/L (ref 134–144)
eGFR: 41 mL/min/{1.73_m2} — ABNORMAL LOW (ref >59)

## 2022-08-19 ENCOUNTER — Other Ambulatory Visit: Payer: Self-pay

## 2022-08-21 ENCOUNTER — Telehealth (HOSPITAL_COMMUNITY): Payer: Self-pay | Admitting: *Deleted

## 2022-08-21 NOTE — Telephone Encounter (Signed)
Attempted to call patient regarding upcoming cardiac CT appointment. °Left message on voicemail with name and callback number ° °Romaldo Saville RN Navigator Cardiac Imaging °[facility] °[phone] Office °[phone] Cell ° °

## 2022-08-24 ENCOUNTER — Telehealth: Payer: Self-pay | Admitting: Internal Medicine

## 2022-08-24 ENCOUNTER — Ambulatory Visit (HOSPITAL_COMMUNITY)
Admission: RE | Admit: 2022-08-24 | Discharge: 2022-08-24 | Disposition: A | Discharge status: Home / Self Care | Payer: PPO | Source: Ambulatory Visit | Attending: Internal Medicine | Admitting: Internal Medicine

## 2022-08-24 DIAGNOSIS — R0609 Other forms of dyspnea: Secondary | ICD-10-CM | POA: Diagnosis not present

## 2022-08-24 DIAGNOSIS — R072 Precordial pain: Secondary | ICD-10-CM | POA: Diagnosis not present

## 2022-08-24 LAB — POCT I-STAT CREATININE: Creatinine, Ser: 1.2 mg/dL — ABNORMAL HIGH (ref 0.44–1.00)

## 2022-08-24 MED ORDER — IOHEXOL 350 MG/ML SOLN
95.0000 mL | Freq: Once | INTRAVENOUS | Status: AC | PRN
  Administered 2022-08-24: 95 mL via INTRAVENOUS

## 2022-08-24 MED ORDER — NITROGLYCERIN 0.4 MG SL SUBL
SUBLINGUAL_TABLET | SUBLINGUAL | Status: AC
  Filled 2022-08-24: qty 2

## 2022-08-24 MED ORDER — NITROGLYCERIN 0.4 MG SL SUBL
0.8000 mg | SUBLINGUAL_TABLET | SUBLINGUAL | Status: DC | PRN
  Administered 2022-08-24: 0.8 mg via SUBLINGUAL

## 2022-08-24 NOTE — Telephone Encounter (Signed)
Spoke to pt. Went over Dr. Lupe Carney notes. Pt made aware the final results are not in yet. She verbalized understanding. No questions expressed at this time.

## 2022-08-24 NOTE — Progress Notes (Signed)
Patient tolerated CT well.Vital signs stable encourage to drink water throughout day.Reasons explained and verbalized understanding. Ambulated steady gait.   

## 2022-08-24 NOTE — Telephone Encounter (Signed)
?  Pt is returning call to get result ?

## 2022-09-07 IMAGING — US US PELVIS COMPLETE WITH TRANSVAGINAL
2 series · 13 of 25 positions shown · non-contrast
Comparison: CT chest abdomen pelvis 03/10/2021

CLINICAL DATA: Abnormal CT demonstrating a LEFT pelvic cyst



[Series 1: us pelvis complete with transvaginal · 0.19mm/px · 7 of 31 slices shown (1 of 2)]
[im 1/31]
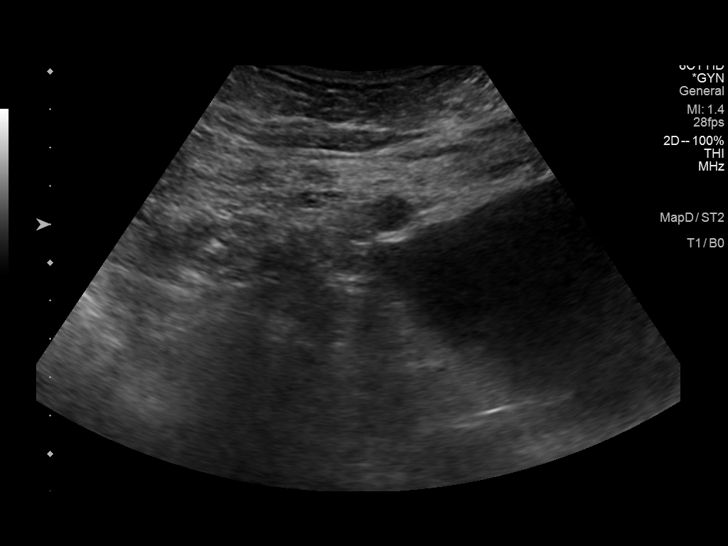
[im 6/31]
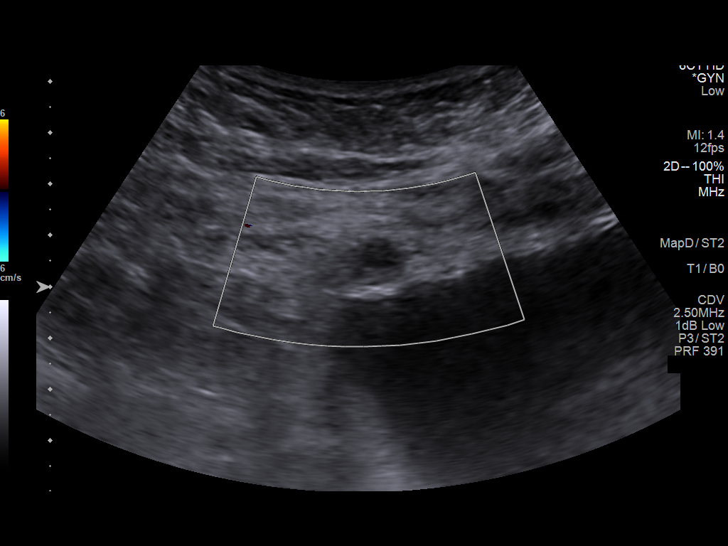
[im 11/31]
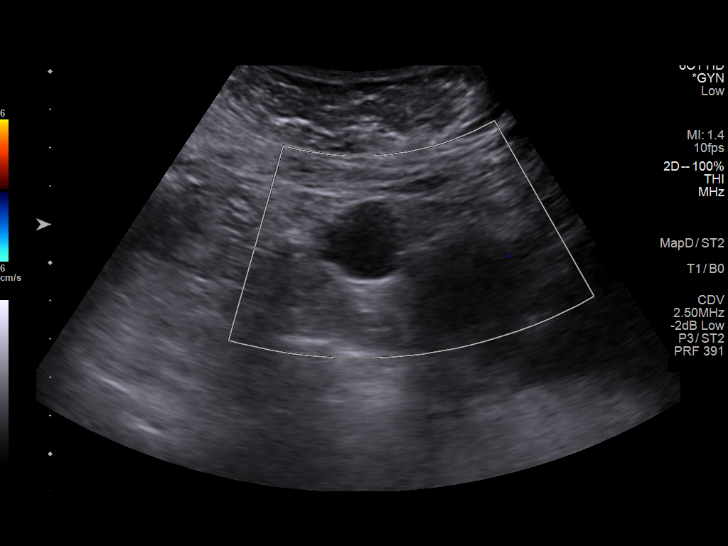
[im 16/31]
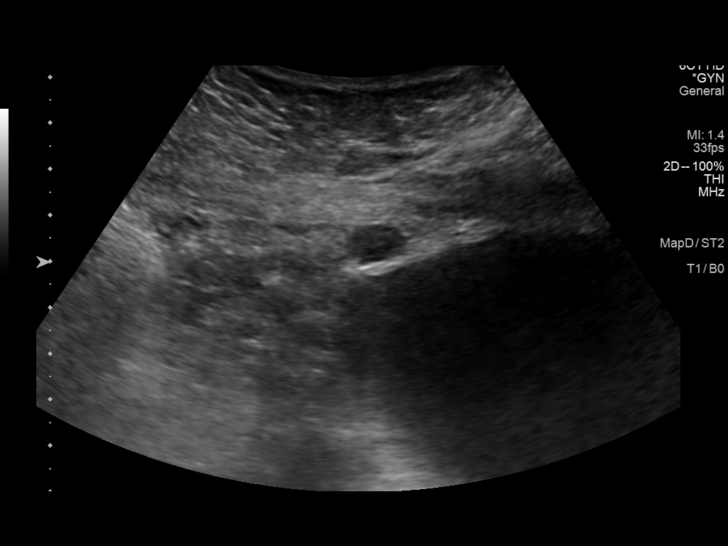
[im 21/31]
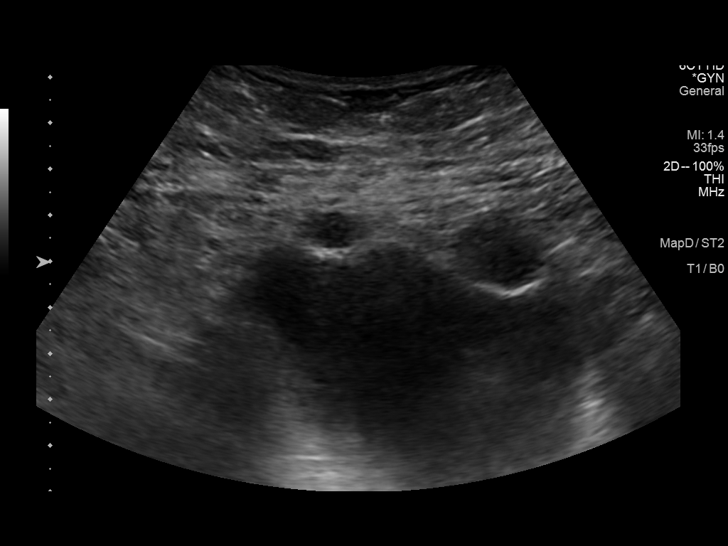
[im 26/31]
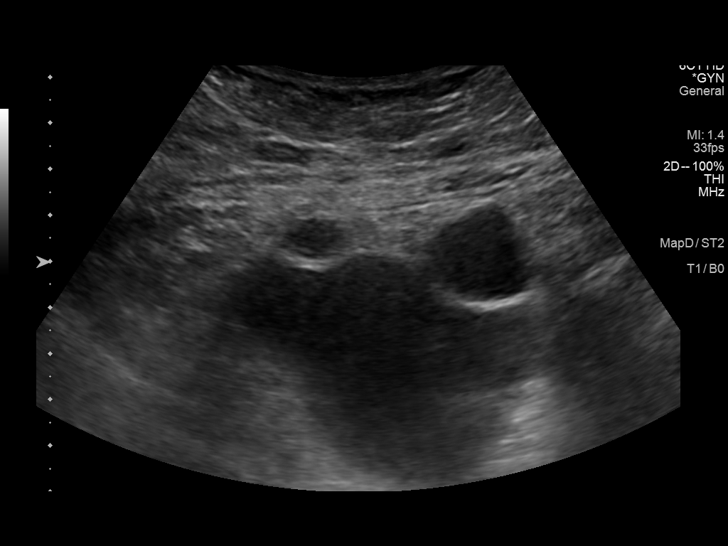
[im 31/31]
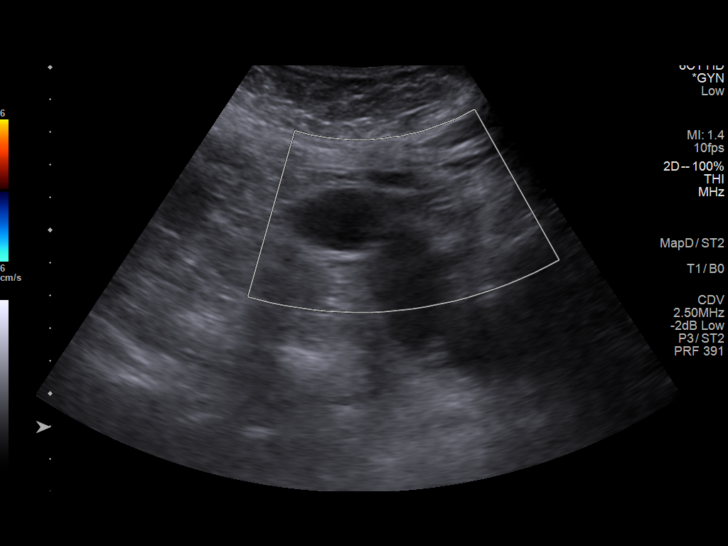

[Series 1001: us pelvis complete with transvaginal · 0.11mm/px · 6 of 28 slices shown (2 of 2)]
[im 3/28]
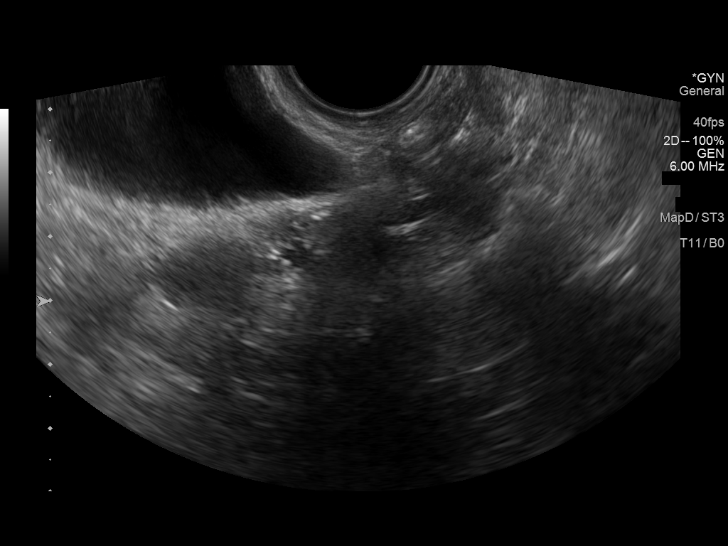
[im 8/28]
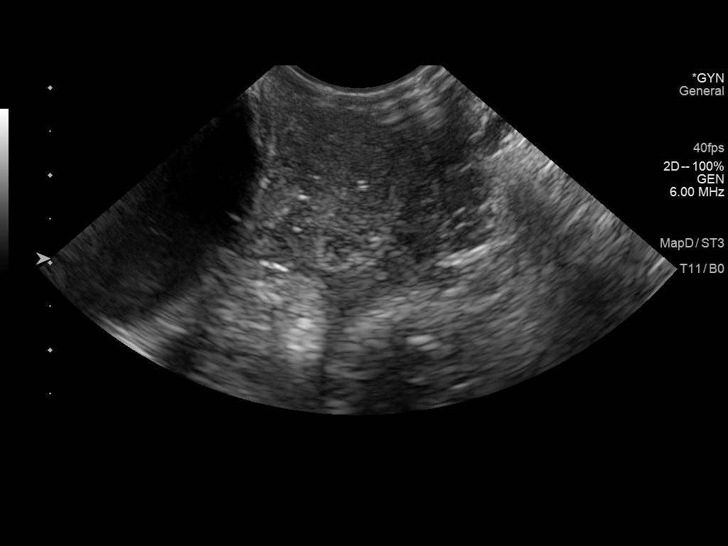
[im 13/28]
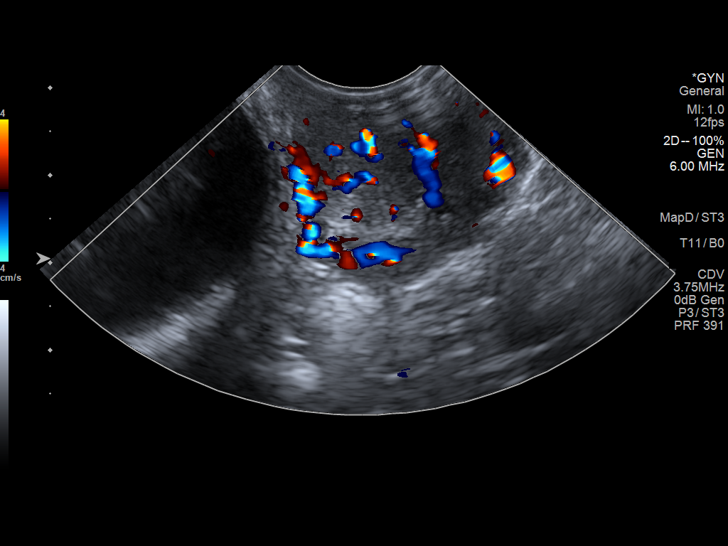
[im 18/28]
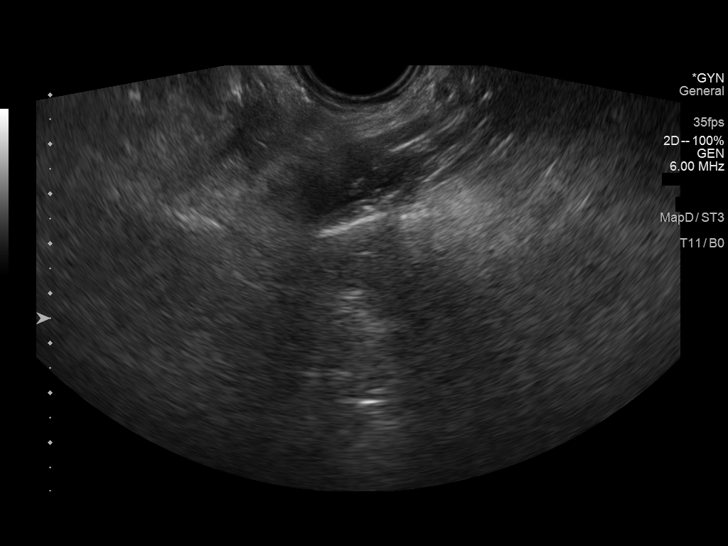
[im 23/28]
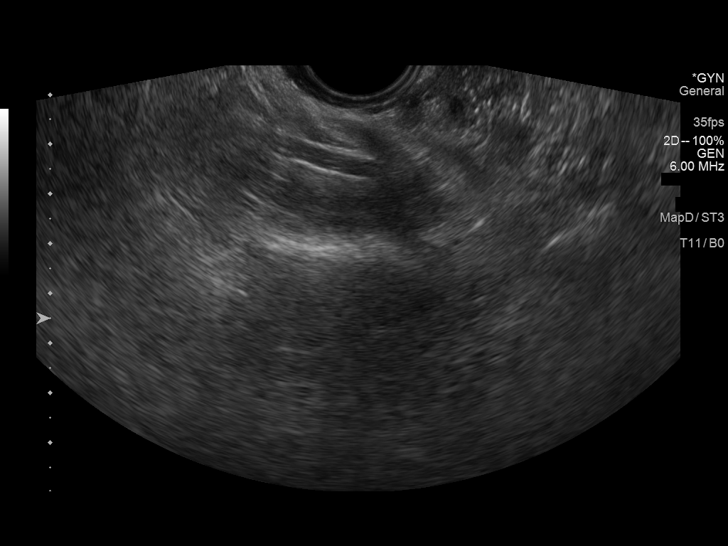
[im 28/28]
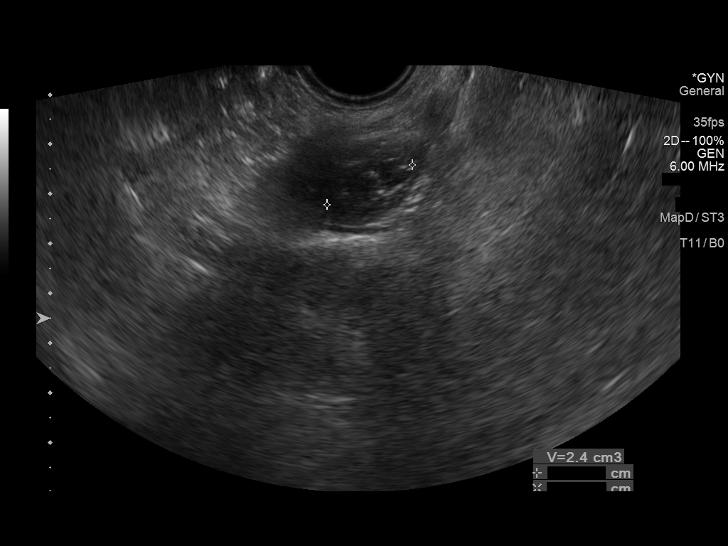

[13 of 25 positions shown; findings below may reference images not displayed]

FINDINGS: Uterus

Surgically absent

Endometrium

Surgically absent

Right ovary

Measurements: 1.7 x 1.0 x 2.2 cm = volume: 15 mL. Probable 10 mm
cyst RIGHT ovary; No followup imaging recommended Note: This
recommendation does not apply to premenarchal patients or to those
with increased risk (genetic, family history, elevated tumor markers
or other high-risk factors) of ovarian cancer. Reference: Radiology
[DATE]):[id number].

Left ovary

Measurements: 3.7 x 2.1 x 3.2 cm = volume: 13 mL. Simple cyst LEFT
ovary, 2.4 x 2.2 x 1.8 cm No followup imaging recommended. Note:
This recommendation does not apply to premenarchal patients or to
those with increased risk (genetic, family history, elevated tumor
markers or other high-risk factors) of ovarian cancer. Reference:
Radiology [DATE]):[id number].

Other findings

No free pelvic fluid.  No adnexal masses.
IMPRESSION: Probable small BILATERAL ovarian cysts; no follow-up imaging
recommended, as above.

Post hysterectomy.

No additional pelvic sonographic abnormalities identified.

## 2022-09-16 ENCOUNTER — Other Ambulatory Visit (HOSPITAL_COMMUNITY): Payer: PPO

## 2022-09-18 ENCOUNTER — Ambulatory Visit (HOSPITAL_COMMUNITY): Payer: PPO | Attending: Internal Medicine

## 2022-09-18 DIAGNOSIS — R0609 Other forms of dyspnea: Secondary | ICD-10-CM

## 2022-09-18 LAB — ECHOCARDIOGRAM COMPLETE
Area-P 1/2: 4.33 cm2
S' Lateral: 2.6 cm

## 2022-09-23 DIAGNOSIS — Z961 Presence of intraocular lens: Secondary | ICD-10-CM | POA: Diagnosis not present

## 2022-09-23 DIAGNOSIS — H5203 Hypermetropia, bilateral: Secondary | ICD-10-CM | POA: Diagnosis not present

## 2022-09-23 DIAGNOSIS — H26492 Other secondary cataract, left eye: Secondary | ICD-10-CM | POA: Diagnosis not present

## 2022-09-23 DIAGNOSIS — H52223 Regular astigmatism, bilateral: Secondary | ICD-10-CM | POA: Diagnosis not present

## 2022-09-23 DIAGNOSIS — H524 Presbyopia: Secondary | ICD-10-CM | POA: Diagnosis not present

## 2022-10-07 ENCOUNTER — Encounter: Payer: Self-pay | Admitting: Internal Medicine

## 2022-10-07 ENCOUNTER — Ambulatory Visit: Payer: PPO | Attending: Internal Medicine | Admitting: Internal Medicine

## 2022-10-07 VITALS — BP 160/82 | HR 55 | Ht 61.0 in | Wt 171.2 lb

## 2022-10-07 DIAGNOSIS — I10 Essential (primary) hypertension: Secondary | ICD-10-CM | POA: Diagnosis not present

## 2022-10-07 DIAGNOSIS — R600 Localized edema: Secondary | ICD-10-CM

## 2022-10-07 DIAGNOSIS — R0609 Other forms of dyspnea: Secondary | ICD-10-CM | POA: Diagnosis not present

## 2022-10-07 DIAGNOSIS — R002 Palpitations: Secondary | ICD-10-CM

## 2022-10-07 NOTE — Patient Instructions (Signed)
Medication Instructions:  Your physician recommends that you continue on your current medications as directed. Please refer to the Current Medication list given to you today.  *If you need a refill on your cardiac medications before your next appointment, please call your pharmacy*   Follow-Up: At [facility], you and your health needs are our priority.  As part of our continuing mission to provide you with exceptional heart care, we have created designated Provider Care Teams.  These Care Teams include your primary Cardiologist (physician) and Advanced Practice Providers (APPs -  Physician Assistants and Nurse Practitioners) who all work together to provide you with the care you need, when you need it.  We recommend signing up for the patient portal called "MyChart".  Sign up information is provided on this After Visit Summary.  MyChart is used to connect with patients for Virtual Visits (Telemedicine).  Patients are able to view lab/test results, encounter notes, upcoming appointments, etc.  Non-urgent messages can be sent to your provider as well.   To learn more about what you can do with MyChart, go to [email or web address].    Your next appointment:   6 month(s)  Provider:   Weston Brass, MD

## 2022-10-07 NOTE — Progress Notes (Signed)
Cardiology Office Note:    Date:  10/07/2022  ID:  Deborah, Schultz 1946-02-05, MRN [id number]  PCP:  Corwin Levins, MD  Cardiologist:  None  Electrophysiologist:  None   Referring MD: Corwin Levins, MD   Chief Complaint/Reason for Referral: Palpitations and dyspnea on exertion, HTN  History of Present Illness:    Deborah Schultz is a 77 y.o. female with a history of palpitations and dyspnea on exertion. She is being seen for the evaluation of hypertension and DOE.   10/07/22: Overall feels well.  The dyspnea that brought her in to my office initially has not recurred.  This was [place] the airport [place].  She feels this is her typical level of activity so she was surprised at the symptoms but since they have not recurred this has been encouraging.  Blood pressure elevated today but has been normal at home and in fact perhaps on the low end of normal.  Typical blood pressures are in the 105-110 systolic range, highest blood pressure 124/78, lowest blood pressure 101/68.  No symptoms with these blood pressures.  If she is symptomatic I have recommended that we down titrate therapy.  Reviewed CT and echocardiogram results which were very reassuring, no coronary artery disease, normal echocardiogram.  Encouragingly there is also normal diastolic function.  We discussed that perhaps her shortness of breath with activity could be related to exercise hypertension or could be due to deconditioning.   Prior visits:  She was seen by Dr. Lawerance Bach on 07/10/2022 for hypertension. She stated her blood pressure was fluctuating at home. She also endorsed fatigue and shortness of breath.   Today, she is accompanied by her husband.   She complains of her blood pressure fluctuating. She monitors her blood pressure at home and has brought in a log with her. Overall her at home readings are normal. She states her monitor shows an alert for an irregular rhythm at  times. She denies any symptoms when her blood pressure is high. She is compliant with 5 mg Amlodipine once daily, 80 mg Lasix once daily, 80 mg Propranolol once daily, 12.5 mg Spironolactone daily, and 80 mg Telmisartan daily.   She reports episodes of exertional fatigue and shortness of breath. Symptoms tend to resolve in minutes after stopping to rest. We discussed the option of a stress test for further evaluation.   She complains of bilateral lower extremity edema in her legs and ankles. She has tried compression socks but stopped using them due to them hurting her legs and being too long.   We discussed the results of her EKG today in comparison to her last EKG, 1st degree AV block.   She denies any palpitations, chest pain. No lightheadedness, headaches, syncope, orthopnea, or PND.  Past Medical History:  Diagnosis Date   ALOPECIA 06/06/2008   Anemia    iron deficiency   ANEMIA-IRON DEFICIENCY 06/06/2008   BICEPS TENDINITIS, LEFT 05/06/2010   BRONCHITIS, ACUTE 06/18/2008   CALLUSES, FEET, BILATERAL 06/06/2008   Chronic pruritus    CONSTIPATION 06/06/2008   Cough 06/18/2008   Degenerative disc disease, cervical    Diverticulitis    DIVERTICULITIS, HX OF 06/06/2008   ELBOW PAIN 05/26/2010   FATIGUE 06/18/2008   HLD (hyperlipidemia)    HTN (hypertension)    HYPERLIPIDEMIA 06/06/2008   OAB (overactive bladder)    OVERACTIVE BLADDER 06/06/2008   PERIPHERAL EDEMA 02/19/2009   Peripheral edema 02/26/2011   Right carpal tunnel syndrome  SINUSITIS- ACUTE-NOS 01/08/2009    Past Surgical History:  Procedure Laterality Date   ABDOMINAL HYSTERECTOMY     APPENDECTOMY     BREAST BIOPSY     BUNIONECTOMY     CATARACT EXTRACTION     bilateral 11/2009-no longer wears glasses   ECTOPIC PREGNANCY SURGERY     OOPHORECTOMY     right thumb surgery      Current Medications: Current Meds  Medication Sig   amLODipine (NORVASC) 5 MG tablet Take 1 tablet by mouth once daily   aspirin 81 MG tablet Take  81 mg by mouth daily.     cholecalciferol (VITAMIN D3) 25 MCG (1000 UNIT) tablet Take 1,000 Units by mouth daily.   estradiol (ESTRACE) 1 MG tablet Take 1 tablet by mouth once daily   furosemide (LASIX) 40 MG tablet TAKE 1 TABLET BY MOUTH TWICE DAILY, WITH SECOND DOSE ONLY AS NEEDED FOR PERSISTENT SWELLING   Multiple Vitamin (MULTIVITAMIN) tablet Take 1 tablet by mouth daily.     oxybutynin (DITROPAN) 5 MG tablet Take 1 tablet by mouth once daily   pravastatin (PRAVACHOL) 40 MG tablet Take 1 tablet by mouth once daily   propranolol (INNOPRAN XL) 80 MG 24 hr capsule Take 1 capsule (80 mg total) by mouth daily.   spironolactone (ALDACTONE) 25 MG tablet Take 0.5 tablets (12.5 mg total) by mouth daily.   telmisartan (MICARDIS) 80 MG tablet Take 1 tablet (80 mg total) by mouth daily.   triamcinolone cream (KENALOG) 0.1 % Apply 1 application topically 2 (two) times daily.     Allergies:   Nystatin, Sulfa antibiotics, Esomeprazole magnesium, Fexofenadine, and Sulfonamide derivatives   Social History   Tobacco Use   Smoking status: Never   Smokeless tobacco: Never  Substance Use Topics   Alcohol use: Yes    Alcohol/week: 0.0 standard drinks of alcohol    Comment: rare   Drug use: No     Family History: The patient's family history includes Cancer in her maternal aunt and maternal uncle; Diabetes in her brother; Hypertension in her mother; Ovarian cancer in her mother. There is no history of Colon cancer or Breast cancer.  ROS:   Please see the history of present illness.     All other systems reviewed and are negative.  EKGs/Labs/Other Studies Reviewed:    The following studies were reviewed today:  Lower Venous DVT Study 12/13/2020: Summary:  RIGHT:  - No evidence of common femoral vein obstruction.    LEFT:  - No evidence of deep vein thrombosis in the lower extremity. No indirect  evidence of obstruction proximal to the inguinal ligament.  - No cystic structure found in the  popliteal fossa.   EKG:  EKG is personally reviewed.  No EKG today 08/13/2022: NSR. 1st degree AV block. PR interval 238 ms. Rate 60 bpm.  Imaging studies that I have independently reviewed today: n/a   Recent Labs: 11/11/2021: Pro B Natriuretic peptide (BNP) 66.0 05/13/2022: ALT 22; Hemoglobin 13.3; Platelets 244.0; TSH 2.91 08/17/2022: BUN 18; Potassium 4.5; Sodium 141 08/24/2022: Creatinine, Ser 1.20  Recent Lipid Panel    Component Value Date/Time   CHOL 184 05/13/2022 1103   TRIG 109.0 05/13/2022 1103   HDL 65.30 05/13/2022 1103   CHOLHDL 3 05/13/2022 1103   VLDL 21.8 05/13/2022 1103   LDLCALC 97 05/13/2022 1103    Physical Exam:    VS:  BP (!) 160/82 (BP Location: Right Arm, Patient Position: Sitting, Cuff Size: Large)   Pulse Marland Kitchen)  55   Ht 5\' 1"  (1.549 m)   Wt 171 lb 3.2 oz (77.7 kg)   SpO2 98%   BMI 32.35 kg/m     Wt Readings from Last 5 Encounters:  10/07/22 171 lb 3.2 oz (77.7 kg)  08/13/22 169 lb 3.2 oz (76.7 kg)  07/10/22 169 lb (76.7 kg)  05/25/22 171 lb (77.6 kg)  05/14/22 173 lb (78.5 kg)    Constitutional: No acute distress Eyes: sclera non-icteric, normal conjunctiva and lids ENMT: normal dentition, moist mucous membranes Cardiovascular: regular rhythm, normal rate, no murmur. S1 and S2 normal. No jugular venous distention.  Respiratory: clear to auscultation bilaterally GI : normal bowel sounds, soft and nontender. No distention.   MSK: extremities warm, well perfused. No edema.  NEURO: grossly nonfocal exam, moves all extremities. PSYCH: alert and oriented x 3, normal mood and affect.   ASSESSMENT:    1. Dyspnea on exertion   2. Primary hypertension   3. Palpitations   4. Bilateral leg edema     PLAN:    Dyspnea on exertion  Primary hypertension  Palpitations  Bilateral leg edema -Reassuring test results with normal echo and normal coronary CTA with no incidental findings. -No changes to blood pressure management at this time, blood  pressure readings at home are significantly better than they are in the office, suggesting whitecoat hypertension.  She will track her blood pressures when symptomatic and continue to monitor at home. -With no coronary artery disease and no coronary calcifications, okay to continue pravastatin 40 mg daily for cholesterol therapy.  Total time of encounter: 20 minutes total time of encounter, including 15 minutes spent in face-to-face patient care on the date of this encounter. This time includes coordination of care and counseling regarding above mentioned problem list. Remainder of non-face-to-face time involved reviewing chart documents/testing relevant to the patient encounter and documentation in the medical record. I have independently reviewed documentation from referring provider.   Weston Brass, MD, [facility]  [facility]    Shared Decision Making/Informed Consent:       Medication Adjustments/Labs and Tests Ordered: Current medicines are reviewed at length with the patient today.  Concerns regarding medicines are outlined above.   No orders of the defined types were placed in this encounter.  No orders of the defined types were placed in this encounter.  Patient Instructions  Medication Instructions:  Your physician recommends that you continue on your current medications as directed. Please refer to the Current Medication list given to you today.  *If you need a refill on your cardiac medications before your next appointment, please call your pharmacy*   Follow-Up: At [facility], you and your health needs are our priority.  As part of our continuing mission to provide you with exceptional heart care, we have created designated Provider Care Teams.  These Care Teams include your primary Cardiologist (physician) and Advanced Practice Providers (APPs -  Physician Assistants and Nurse Practitioners) who all work together to provide you with the care you need,  when you need it.  We recommend signing up for the patient portal called "MyChart".  Sign up information is provided on this After Visit Summary.  MyChart is used to connect with patients for Virtual Visits (Telemedicine).  Patients are able to view lab/test results, encounter notes, upcoming appointments, etc.  Non-urgent messages can be sent to your provider as well.   To learn more about what you can do with MyChart, go to [email or web address].  Your next appointment:   6 month(s)  Provider:   Weston Brass, MD

## 2022-10-08 ENCOUNTER — Ambulatory Visit (INDEPENDENT_AMBULATORY_CARE_PROVIDER_SITE_OTHER): Payer: PPO | Admitting: Family Medicine

## 2022-10-08 VITALS — BP 124/68 | HR 60 | Temp 97.6°F | Ht 61.0 in | Wt 171.0 lb

## 2022-10-08 DIAGNOSIS — R051 Acute cough: Secondary | ICD-10-CM

## 2022-10-08 DIAGNOSIS — J029 Acute pharyngitis, unspecified: Secondary | ICD-10-CM | POA: Diagnosis not present

## 2022-10-08 DIAGNOSIS — H6691 Otitis media, unspecified, right ear: Secondary | ICD-10-CM | POA: Diagnosis not present

## 2022-10-08 LAB — POC COVID19 BINAXNOW: SARS Coronavirus 2 Ag: NEGATIVE

## 2022-10-08 MED ORDER — AMOXICILLIN-POT CLAVULANATE 875-125 MG PO TABS
1.0000 | ORAL_TABLET | Freq: Two times a day (BID) | ORAL | 0 refills | Status: DC
Start: 2022-10-08 — End: 2022-10-19

## 2022-10-08 NOTE — Patient Instructions (Signed)
Take the antibiotic as prescribed.  Take Tylenol or ibuprofen as needed for pain.  I recommend salt water gargles and throat lozenges for your sore throat.  Follow-up if worsening or if you are not back to baseline when you complete the antibiotic.

## 2022-10-08 NOTE — Progress Notes (Signed)
Subjective:  Deborah Schultz is a 77 y.o. female who presents for a 2 day hx of ST and cough. Bilateral ear ache worse on right.  Taking Robitussin and throat lozenges.   Denies fever, chills, body aches, dizziness, headache, rhinorrhea, sneezing, chest pain, palpitations, shortness of breath, abdominal pain, N/V/D.   No other aggravating or relieving factors.  No other c/o.  ROS as in subjective.   Objective: Vitals:   10/08/22 1430  BP: 124/68  Pulse: 60  Temp: 97.6 F (36.4 C)  SpO2: 98%    General appearance: Alert, WD/WN, no distress, mildly ill appearing                             Skin: warm, no rash                           Head: no sinus tenderness                            Eyes: conjunctiva normal, corneas clear, PERRLA                            Ears: right TM with yellow fluid and dull, left TM with good light reflex, external ear canals normal                          Nose: septum midline, turbinates swollen, with erythema              Mouth/throat: MMM, tongue normal, mild pharyngeal erythema                           Neck: supple, no adenopathy, no thyromegaly, nontender                          Heart: RRR                         Lungs: CTA bilaterally, no wheezes, rales, or rhonchi      Assessment: Acute otitis media, right - Plan: amoxicillin-clavulanate (AUGMENTIN) 875-125 MG tablet  Acute cough - Plan: POC COVID-19  Acute pharyngitis, unspecified etiology   Plan: Negative Covid test.  Augmentin prescribed.  Suggested symptomatic OTC remedies. Salt water gargles and throat lozenges.  Tylenol or Ibuprofen OTC prn.  Call/return if worsening or if not back to baseline after completing the antibiotic.

## 2022-10-19 ENCOUNTER — Encounter: Payer: Self-pay | Admitting: Internal Medicine

## 2022-10-19 ENCOUNTER — Ambulatory Visit (AMBULATORY_SURGERY_CENTER): Payer: PPO

## 2022-10-19 VITALS — Ht 61.0 in | Wt 171.0 lb

## 2022-10-19 DIAGNOSIS — Z1211 Encounter for screening for malignant neoplasm of colon: Secondary | ICD-10-CM

## 2022-10-19 MED ORDER — NA SULFATE-K SULFATE-MG SULF 17.5-3.13-1.6 GM/177ML PO SOLN: 1.0000 | Freq: Once | ORAL | 0 refills | Status: AC

## 2022-10-19 NOTE — Progress Notes (Signed)
No egg or soy allergy known to patient  No issues known to pt with past sedation with any surgeries or procedures Patient denies ever being told they had issues or difficulty with intubation  No FH of Malignant Hyperthermia Pt is not on diet pills Pt is not on  home 02  Pt is not on blood thinners  Pt denies issues with constipation  No A fib or A flutter Have any cardiac testing pending--no  LOA: independent  Prep:   Patient's chart reviewed by Cathlyn Parsons CNRA prior to previsit and patient appropriate for the [facility].  Previsit completed and red dot placed by patient's name on their procedure day (on provider's schedule).     PV competed with patient. Prep instructions sent via mychart and home address. [facility]. Goodrx coupon for [facility] provided to use for price reduction if needed. Pt made aware to call if she has issues with pricing so rx can be sent to [facility]

## 2022-10-20 ENCOUNTER — Encounter: Payer: Self-pay | Admitting: Internal Medicine

## 2022-10-20 ENCOUNTER — Ambulatory Visit: Payer: PPO | Admitting: Internal Medicine

## 2022-10-20 ENCOUNTER — Other Ambulatory Visit: Payer: Self-pay | Admitting: Internal Medicine

## 2022-10-20 ENCOUNTER — Telehealth: Payer: Self-pay | Admitting: Internal Medicine

## 2022-10-20 VITALS — BP 138/84 | HR 56 | Temp 98.9°F | Ht 61.0 in | Wt 173.0 lb

## 2022-10-20 DIAGNOSIS — E7800 Pure hypercholesterolemia, unspecified: Secondary | ICD-10-CM | POA: Diagnosis not present

## 2022-10-20 DIAGNOSIS — N1831 Chronic kidney disease, stage 3a: Secondary | ICD-10-CM

## 2022-10-20 DIAGNOSIS — I10 Essential (primary) hypertension: Secondary | ICD-10-CM | POA: Diagnosis not present

## 2022-10-20 DIAGNOSIS — U071 COVID-19: Secondary | ICD-10-CM

## 2022-10-20 DIAGNOSIS — Z0001 Encounter for general adult medical examination with abnormal findings: Secondary | ICD-10-CM

## 2022-10-20 DIAGNOSIS — H6691 Otitis media, unspecified, right ear: Secondary | ICD-10-CM

## 2022-10-20 DIAGNOSIS — R739 Hyperglycemia, unspecified: Secondary | ICD-10-CM

## 2022-10-20 LAB — POC COVID19 BINAXNOW: SARS Coronavirus 2 Ag: POSITIVE — AB

## 2022-10-20 MED ORDER — NIRMATRELVIR/RITONAVIR (PAXLOVID) TABLET (RENAL DOSING): 2.0000 | ORAL_TABLET | Freq: Two times a day (BID) | ORAL | 0 refills | Status: AC

## 2022-10-20 MED ORDER — NA SULFATE-K SULFATE-MG SULF 17.5-3.13-1.6 GM/177ML PO SOLN
1.0000 | Freq: Once | ORAL | 0 refills | Status: AC
Start: 2022-10-20 — End: 2022-10-20

## 2022-10-20 MED ORDER — HYDROCODONE BIT-HOMATROP MBR 5-1.5 MG/5ML PO SOLN: 5.0000 mL | Freq: Four times a day (QID) | ORAL | 0 refills | Status: AC | PRN

## 2022-10-20 NOTE — Patient Instructions (Addendum)
Please take all new medication as prescribed - the paxlovid antibiotic for COVID, and the cough medicine  Please stay at home for 5 days  Please HOLD the pravastatin for 5 days while taking the paxlovid  Please have your husband do the same, and I will send medication for him as well  Please continue all other medications as before, and refills have been done if requested.  Please have the pharmacy call with any other refills you may need.  Please keep your appointments with your specialists as you may have planned  We can hold on lab testing today - but will do at your next appt

## 2022-10-20 NOTE — Addendum Note (Signed)
Addended by: Mason Jim on: 10/20/2022 12:51 PM   Modules accepted: Orders

## 2022-10-20 NOTE — Progress Notes (Unsigned)
Patient ID: Deborah Schultz, female   DOB: September 27, 1945, 77 y.o.   MRN: [id number]        Chief Complaint: follow up acute upper resp infection symptoms, htn, hld, ckd3a       HPI:  [name] is a 77 y.o. female  Here with 2-3 days acute onset fever, facial pain, pressure, headache, general weakness and malaise, and clearish d/c, with mild ST and cough, but pt denies chest pain, wheezing, increased sob or doe, orthopnea, PND, increased LE swelling, palpitations, dizziness or syncope.  Pt denies polydipsia, polyuria, or new focal neuro s/s.    Pt denies fever, wt loss, night sweats, loss of appetite, or other constitutional symptoms         Wt Readings from Last 3 Encounters:  10/20/22 173 lb (78.5 kg)  10/19/22 171 lb (77.6 kg)  10/08/22 171 lb (77.6 kg)   BP Readings from Last 3 Encounters:  10/20/22 138/84  10/08/22 124/68  10/07/22 (!) 160/82         Past Medical History:  Diagnosis Date   ALOPECIA 06/06/2008   Anemia    iron deficiency   ANEMIA-IRON DEFICIENCY 06/06/2008   BICEPS TENDINITIS, LEFT 05/06/2010   BRONCHITIS, ACUTE 06/18/2008   CALLUSES, FEET, BILATERAL 06/06/2008   Chronic kidney disease    Chronic pruritus    CONSTIPATION 06/06/2008   Cough 06/18/2008   Degenerative disc disease, cervical    Diverticulitis    DIVERTICULITIS, HX OF 06/06/2008   ELBOW PAIN 05/26/2010   FATIGUE 06/18/2008   HLD (hyperlipidemia)    HTN (hypertension)    HYPERLIPIDEMIA 06/06/2008   OAB (overactive bladder)    OVERACTIVE BLADDER 06/06/2008   PERIPHERAL EDEMA 02/19/2009   Peripheral edema 02/26/2011   Right carpal tunnel syndrome    SINUSITIS- ACUTE-NOS 01/08/2009   Past Surgical History:  Procedure Laterality Date   ABDOMINAL HYSTERECTOMY     APPENDECTOMY     BREAST BIOPSY     BUNIONECTOMY     CATARACT EXTRACTION     bilateral 11/2009-no longer wears glasses   ECTOPIC PREGNANCY SURGERY     OOPHORECTOMY     right thumb surgery      reports that  she has never smoked. She has never used smokeless tobacco. She reports current alcohol use. She reports that she does not use drugs. family history includes Cancer in her maternal aunt and maternal uncle; Diabetes in her brother; Hypertension in her mother; Ovarian cancer in her mother. Allergies  Allergen Reactions   Nystatin Rash   Sulfa Antibiotics Anaphylaxis and Other (See Comments)    Per pt: unknown reaction   Esomeprazole Magnesium     REACTION: Headache   Fexofenadine     REACTION: Hives   Sulfonamide Derivatives     Per pt: unknown reaction   Current Outpatient Medications on File Prior to Visit  Medication Sig Dispense Refill   amLODipine (NORVASC) 5 MG tablet Take 1 tablet by mouth once daily 90 tablet 0   aspirin 81 MG tablet Take 81 mg by mouth daily.       cholecalciferol (VITAMIN D3) 25 MCG (1000 UNIT) tablet Take 1,000 Units by mouth daily.     estradiol (ESTRACE) 1 MG tablet Take 1 tablet by mouth once daily 90 tablet 2   furosemide (LASIX) 40 MG tablet TAKE 1 TABLET BY MOUTH TWICE DAILY, WITH SECOND DOSE ONLY AS NEEDED FOR PERSISTENT SWELLING 180 tablet 0   hydrocortisone 2.5 % cream Apply 1 Application  topically 2 (two) times daily as needed.     Multiple Vitamin (MULTIVITAMIN) tablet Take 1 tablet by mouth daily.       oxybutynin (DITROPAN) 5 MG tablet Take 1 tablet by mouth once daily 90 tablet 0   pravastatin (PRAVACHOL) 40 MG tablet Take 1 tablet by mouth once daily 90 tablet 2   propranolol (INNOPRAN XL) 80 MG 24 hr capsule Take 1 capsule (80 mg total) by mouth daily.     spironolactone (ALDACTONE) 25 MG tablet Take 0.5 tablets (12.5 mg total) by mouth daily. 15 tablet 3   telmisartan (MICARDIS) 80 MG tablet Take 1 tablet (80 mg total) by mouth daily. 90 tablet 3   triamcinolone cream (KENALOG) 0.1 % Apply 1 application topically 2 (two) times daily. 30 g 0   No current facility-administered medications on file prior to visit.        ROS:  All others reviewed  and negative.  Objective        PE:  BP 138/84 (BP Location: Left Arm, Patient Position: Sitting, Cuff Size: Normal)   Pulse (!) 56   Temp 98.9 F (37.2 C) (Oral)   Ht 5\' 1"  (1.549 m)   Wt 173 lb (78.5 kg)   SpO2 99%   BMI 32.69 kg/m                 Constitutional: Pt appears in NAD               HENT: Head: NCAT.                Right Ear: External ear normal.                 Left Ear: External ear normal. Bilat tm's with mild erythema.  Max sinus areas mild tender.  Pharynx with mild erythema, no exudate               Eyes: . Pupils are equal, round, and reactive to light. Conjunctivae and EOM are normal               Nose: without d/c or deformity               Neck: Neck supple. Gross normal ROM               Cardiovascular: Normal rate and regular rhythm.                 Pulmonary/Chest: Effort normal and breath sounds without rales or wheezing.                Abd:  Soft, NT, ND, + BS, no organomegaly               Neurological: Pt is alert. At baseline orientation, motor grossly intact               Skin: Skin is warm. No rashes, no other new lesions, LE edema - none               Psychiatric: Pt behavior is normal without agitation   Micro: none  Cardiac tracings I have personally interpreted today:  none  Pertinent Radiological findings (summarize): none   Lab Results  Component Value Date   WBC 7.2 05/13/2022   HGB 13.3 05/13/2022   HCT 39.6 05/13/2022   PLT 244.0 05/13/2022   GLUCOSE 98 08/17/2022   CHOL 184 05/13/2022   TRIG 109.0 05/13/2022   HDL 65.30 05/13/2022   LDLCALC  97 05/13/2022   ALT 22 05/13/2022   AST 22 05/13/2022   NA 141 08/17/2022   K 4.5 08/17/2022   CL 101 08/17/2022   CREATININE 1.20 (H) 08/24/2022   BUN 18 08/17/2022   CO2 26 08/17/2022   TSH 2.91 05/13/2022   HGBA1C 6.0 05/13/2022   POCT - COVID - positive  Assessment/Plan:  [name] is a 77 y.o. Black or African American [2] female with  has a past medical  history of ALOPECIA (06/06/2008), Anemia, ANEMIA-IRON DEFICIENCY (06/06/2008), BICEPS TENDINITIS, LEFT (05/06/2010), BRONCHITIS, ACUTE (06/18/2008), CALLUSES, FEET, BILATERAL (06/06/2008), Chronic kidney disease, Chronic pruritus, CONSTIPATION (06/06/2008), Cough (06/18/2008), Degenerative disc disease, cervical, Diverticulitis, DIVERTICULITIS, HX OF (06/06/2008), ELBOW PAIN (05/26/2010), FATIGUE (06/18/2008), HLD (hyperlipidemia), HTN (hypertension), HYPERLIPIDEMIA (06/06/2008), OAB (overactive bladder), OVERACTIVE BLADDER (06/06/2008), PERIPHERAL EDEMA (02/19/2009), Peripheral edema (02/26/2011), Right carpal tunnel syndrome, and SINUSITIS- ACUTE-NOS (01/08/2009).  CKD (chronic kidney disease) stage 3, GFR 30-59 ml/min (HCC) Lab Results  Component Value Date   CREATININE 1.20 (H) 08/24/2022   Stable overall, cont to avoid nephrotoxins'  Essential hypertension BP Readings from Last 3 Encounters:  10/20/22 138/84  10/08/22 124/68  10/07/22 (!) 160/82   Stable, pt to continue medical treatment norvasc 5 every day, innopran xl 80 every day, micardis 80 qd   Hyperlipidemia Lab Results  Component Value Date   LDLCALC 97 05/13/2022   Stable, pt to continue current statin pravachol 40 qd   Hyperglycemia Lab Results  Component Value Date   HGBA1C 6.0 05/13/2022   Stable, pt to continue current medical treatment  - diet, wt control  COVID-19 virus infection Mild to mod, for antibx course paxlovid, cough med prn,  to f/u any worsening symptoms or concerns  Followup: Return if symptoms worsen or fail to improve.  Oliver Barre, MD 10/22/2022 8:07 PM [facility]

## 2022-10-20 NOTE — Telephone Encounter (Signed)
Inbound call from patient wishing for prep medication be sent to [facility] on [address] rather than her [place] pharmacy due it being more affordable. Please advise, thank you.

## 2022-10-21 ENCOUNTER — Encounter: Payer: Self-pay | Admitting: Internal Medicine

## 2022-10-21 MED ORDER — NA SULFATE-K SULFATE-MG SULF 17.5-3.13-1.6 GM/177ML PO SOLN: ORAL | 0 refills | Status: DC

## 2022-10-22 ENCOUNTER — Encounter: Payer: Self-pay | Admitting: Internal Medicine

## 2022-10-22 DIAGNOSIS — U071 COVID-19: Secondary | ICD-10-CM | POA: Insufficient documentation

## 2022-10-22 NOTE — Assessment & Plan Note (Signed)
Lab Results  Component Value Date   HGBA1C 6.0 05/13/2022   Stable, pt to continue current medical treatment  - diet, wt control

## 2022-10-22 NOTE — Assessment & Plan Note (Signed)
Mild to mod, for antibx course  - paxlovid, cough med prn,  to f/u any worsening symptoms or concerns °

## 2022-10-22 NOTE — Assessment & Plan Note (Signed)
Lab Results  Component Value Date   LDLCALC 97 05/13/2022   Stable, pt to continue current statin pravachol 40 qd

## 2022-10-22 NOTE — Assessment & Plan Note (Signed)
Lab Results  Component Value Date   CREATININE 1.20 (H) 08/24/2022   Stable overall, cont to avoid nephrotoxins'

## 2022-10-22 NOTE — Assessment & Plan Note (Signed)
BP Readings from Last 3 Encounters:  10/20/22 138/84  10/08/22 124/68  10/07/22 (!) 160/82   Stable, pt to continue medical treatment norvasc 5 every day, innopran xl 80 every day, micardis 80 qd

## 2022-10-24 ENCOUNTER — Other Ambulatory Visit: Payer: Self-pay | Admitting: Internal Medicine

## 2022-10-26 ENCOUNTER — Other Ambulatory Visit: Payer: Self-pay

## 2022-10-31 ENCOUNTER — Other Ambulatory Visit: Payer: Self-pay | Admitting: Internal Medicine

## 2022-11-02 ENCOUNTER — Encounter: Payer: Self-pay | Admitting: Internal Medicine

## 2022-11-05 ENCOUNTER — Other Ambulatory Visit (INDEPENDENT_AMBULATORY_CARE_PROVIDER_SITE_OTHER): Payer: PPO

## 2022-11-05 ENCOUNTER — Encounter: Payer: Self-pay | Admitting: Internal Medicine

## 2022-11-05 DIAGNOSIS — I10 Essential (primary) hypertension: Secondary | ICD-10-CM | POA: Diagnosis not present

## 2022-11-05 DIAGNOSIS — R739 Hyperglycemia, unspecified: Secondary | ICD-10-CM

## 2022-11-05 LAB — HEPATIC FUNCTION PANEL
ALT: 32 U/L (ref 0–35)
AST: 25 U/L (ref 0–37)
Albumin: 4.1 g/dL (ref 3.5–5.2)
Alkaline Phosphatase: 66 U/L (ref 39–117)
Bilirubin, Direct: 0.1 mg/dL (ref 0.0–0.3)
Total Bilirubin: 0.5 mg/dL (ref 0.2–1.2)
Total Protein: 7.1 g/dL (ref 6.0–8.3)

## 2022-11-05 LAB — BASIC METABOLIC PANEL
BUN: 21 mg/dL (ref 6–23)
CO2: 30 mEq/L (ref 19–32)
Calcium: 9.9 mg/dL (ref 8.4–10.5)
Chloride: 100 mEq/L (ref 96–112)
Creatinine, Ser: 1.1 mg/dL (ref 0.40–1.20)
GFR: 49.18 mL/min — ABNORMAL LOW (ref >60.00)
Glucose, Bld: 88 mg/dL (ref 70–99)
Potassium: 4.1 mEq/L (ref 3.5–5.1)
Sodium: 135 mEq/L (ref 135–145)

## 2022-11-05 LAB — CBC WITH DIFFERENTIAL/PLATELET
Basophils Absolute: 0.1 10*3/uL (ref 0.0–0.1)
Basophils Relative: 0.6 % (ref 0.0–3.0)
Eosinophils Absolute: 0.3 10*3/uL (ref 0.0–0.7)
Eosinophils Relative: 2.9 % (ref 0.0–5.0)
HCT: 38.4 % (ref 36.0–46.0)
Hemoglobin: 12.9 g/dL (ref 12.0–15.0)
Lymphocytes Relative: 31.4 % (ref 12.0–46.0)
Lymphs Abs: 2.9 10*3/uL (ref 0.7–4.0)
MCHC: 33.6 g/dL (ref 30.0–36.0)
MCV: 92.5 fl (ref 78.0–100.0)
Monocytes Absolute: 1 10*3/uL (ref 0.1–1.0)
Monocytes Relative: 11.5 % (ref 3.0–12.0)
Neutro Abs: 4.9 10*3/uL (ref 1.4–7.7)
Neutrophils Relative %: 53.6 % (ref 43.0–77.0)
Platelets: 235 10*3/uL (ref 150.0–400.0)
RBC: 4.16 Mil/uL (ref 3.87–5.11)
RDW: 13.8 % (ref 11.5–15.5)
WBC: 9.1 10*3/uL (ref 4.0–10.5)

## 2022-11-05 LAB — HEMOGLOBIN A1C: Hgb A1c MFr Bld: 6 % (ref 4.6–6.5)

## 2022-11-05 LAB — LIPID PANEL
Cholesterol: 185 mg/dL (ref 0–200)
HDL: 55.8 mg/dL (ref >39.00)
LDL Cholesterol: 100 mg/dL — ABNORMAL HIGH (ref 0–99)
NonHDL: 128.76
Total CHOL/HDL Ratio: 3
Triglycerides: 143 mg/dL (ref 0.0–149.0)
VLDL: 28.6 mg/dL (ref 0.0–40.0)

## 2022-11-05 NOTE — Progress Notes (Signed)
The test results show that your current treatment is OK, as the tests are stable.  Please continue the same plan.  There is no other need for change of treatment or further evaluation based on these results, at this time.  thanks 

## 2022-11-12 ENCOUNTER — Ambulatory Visit: Payer: PPO | Admitting: Internal Medicine

## 2022-11-12 ENCOUNTER — Encounter: Payer: PPO | Admitting: Internal Medicine

## 2022-11-12 VITALS — BP 120/80 | HR 70 | Temp 98.2°F | Ht 61.0 in | Wt 177.0 lb

## 2022-11-12 DIAGNOSIS — E7800 Pure hypercholesterolemia, unspecified: Secondary | ICD-10-CM | POA: Diagnosis not present

## 2022-11-12 DIAGNOSIS — E559 Vitamin D deficiency, unspecified: Secondary | ICD-10-CM

## 2022-11-12 DIAGNOSIS — R600 Localized edema: Secondary | ICD-10-CM | POA: Diagnosis not present

## 2022-11-12 DIAGNOSIS — R32 Unspecified urinary incontinence: Secondary | ICD-10-CM | POA: Diagnosis not present

## 2022-11-12 DIAGNOSIS — R739 Hyperglycemia, unspecified: Secondary | ICD-10-CM | POA: Diagnosis not present

## 2022-11-12 DIAGNOSIS — N318 Other neuromuscular dysfunction of bladder: Secondary | ICD-10-CM | POA: Diagnosis not present

## 2022-11-12 DIAGNOSIS — E538 Deficiency of other specified B group vitamins: Secondary | ICD-10-CM

## 2022-11-12 DIAGNOSIS — N1831 Chronic kidney disease, stage 3a: Secondary | ICD-10-CM

## 2022-11-12 DIAGNOSIS — I10 Essential (primary) hypertension: Secondary | ICD-10-CM

## 2022-11-12 LAB — URINALYSIS, ROUTINE W REFLEX MICROSCOPIC
Bilirubin Urine: NEGATIVE
Hgb urine dipstick: NEGATIVE
Ketones, ur: NEGATIVE
Leukocytes,Ua: NEGATIVE
Nitrite: NEGATIVE
Specific Gravity, Urine: <=1.005 — AB (ref 1.000–1.030)
Total Protein, Urine: NEGATIVE
Urine Glucose: NEGATIVE
Urobilinogen, UA: 0.2 (ref 0.0–1.0)
pH: 6 (ref 5.0–8.0)

## 2022-11-12 MED ORDER — OXYBUTYNIN CHLORIDE ER 10 MG PO TB24
10.0000 mg | ORAL_TABLET | Freq: Every day | ORAL | 3 refills | Status: DC
Start: 2022-11-12 — End: 2023-05-12

## 2022-11-12 NOTE — Progress Notes (Signed)
Patient ID: Deborah Schultz, female   DOB: Apr 10, 1946, 77 y.o.   MRN: [id number]        Chief Complaint: follow up peripheral edema, ckd, urinary incontinence and frequency, , htn,  hld, hyperglycemia       HPI:  Deborah Schultz is a 77 y.o. female here overall doing ok and leg swelling has resolved with diuretic use, now with slightly worsened cr at last labs, increased urinary incontinence and frequency. Denies urinary symptoms such as dysuria, urgency, flank pain, hematuria or n/v, fever, chills.  Pt denies chest pain, increased sob or doe, wheezing, orthopnea, PND, increased LE swelling, palpitations, dizziness or syncope.   Pt denies polydipsia, polyuria, or new focal neuro s/s.    Pt denies fever, wt loss, night sweats, loss of appetite, or other constitutional symptms       Wt Readings from Last 3 Encounters:  11/12/22 177 lb (80.3 kg)  10/20/22 173 lb (78.5 kg)  10/19/22 171 lb (77.6 kg)   BP Readings from Last 3 Encounters:  11/12/22 120/80  10/20/22 138/84  10/08/22 124/68         Past Medical History:  Diagnosis Date   ALOPECIA 06/06/2008   Anemia    iron deficiency   ANEMIA-IRON DEFICIENCY 06/06/2008   BICEPS TENDINITIS, LEFT 05/06/2010   BRONCHITIS, ACUTE 06/18/2008   CALLUSES, FEET, BILATERAL 06/06/2008   Chronic kidney disease    Chronic pruritus    CONSTIPATION 06/06/2008   Cough 06/18/2008   Degenerative disc disease, cervical    Diverticulitis    DIVERTICULITIS, HX OF 06/06/2008   ELBOW PAIN 05/26/2010   FATIGUE 06/18/2008   HLD (hyperlipidemia)    HTN (hypertension)    HYPERLIPIDEMIA 06/06/2008   OAB (overactive bladder)    OVERACTIVE BLADDER 06/06/2008   PERIPHERAL EDEMA 02/19/2009   Peripheral edema 02/26/2011   Right carpal tunnel syndrome    SINUSITIS- ACUTE-NOS 01/08/2009   Past Surgical History:  Procedure Laterality Date   ABDOMINAL HYSTERECTOMY     APPENDECTOMY     BREAST BIOPSY     BUNIONECTOMY     CATARACT EXTRACTION      bilateral 11/2009-no longer wears glasses   ECTOPIC PREGNANCY SURGERY     OOPHORECTOMY     right thumb surgery      reports that she has never smoked. She has never used smokeless tobacco. She reports current alcohol use. She reports that she does not use drugs. family history includes Cancer in her maternal aunt and maternal uncle; Diabetes in her brother; Hypertension in her mother; Ovarian cancer in her mother. Allergies  Allergen Reactions   Nystatin Rash   Sulfa Antibiotics Anaphylaxis and Other (See Comments)    Per pt: unknown reaction   Esomeprazole Magnesium     REACTION: Headache   Fexofenadine     REACTION: Hives   Sulfonamide Derivatives     Per pt: unknown reaction   Current Outpatient Medications on File Prior to Visit  Medication Sig Dispense Refill   amLODipine (NORVASC) 5 MG tablet Take 1 tablet by mouth once daily 90 tablet 0   aspirin 81 MG tablet Take 81 mg by mouth daily.       cholecalciferol (VITAMIN D3) 25 MCG (1000 UNIT) tablet Take 1,000 Units by mouth daily.     estradiol (ESTRACE) 1 MG tablet Take 1 tablet by mouth once daily 90 tablet 2   furosemide (LASIX) 40 MG tablet TAKE 1 TABLET BY MOUTH TWICE DAILY. SECOND DOSE SHOULD BE  TAKEN AS NEEDED FOR PERSISTENT SWELLING 180 tablet 0   hydrocortisone 2.5 % cream Apply 1 Application topically 2 (two) times daily as needed.     Multiple Vitamin (MULTIVITAMIN) tablet Take 1 tablet by mouth daily.       Na Sulfate-K Sulfate-Mg Sulf 17.5-3.13-1.6 GM/177ML SOLN Use as directed; may use generic; goodrx card if insurance will not cover generic 354 mL 0   pravastatin (PRAVACHOL) 40 MG tablet Take 1 tablet by mouth once daily 90 tablet 2   propranolol (INNOPRAN XL) 80 MG 24 hr capsule Take 1 capsule (80 mg total) by mouth daily.     spironolactone (ALDACTONE) 25 MG tablet Take 1/2 (one-half) tablet by mouth once daily 15 tablet 0   telmisartan (MICARDIS) 80 MG tablet Take 1 tablet (80 mg total) by mouth daily. 90  tablet 3   triamcinolone cream (KENALOG) 0.1 % Apply 1 application topically 2 (two) times daily. 30 g 0   No current facility-administered medications on file prior to visit.        ROS:  All others reviewed and negative.  Objective        PE:  BP 120/80 (BP Location: Left Arm, Patient Position: Sitting, Cuff Size: Large)   Pulse 70   Temp 98.2 F (36.8 C) (Oral)   Ht 5\' 1"  (1.549 m)   Wt 177 lb (80.3 kg)   SpO2 98%   BMI 33.44 kg/m                 Constitutional: Pt appears in NAD               HENT: Head: NCAT.                Right Ear: External ear normal.                 Left Ear: External ear normal.                Eyes: . Pupils are equal, round, and reactive to light. Conjunctivae and EOM are normal               Nose: without d/c or deformity               Neck: Neck supple. Gross normal ROM               Cardiovascular: Normal rate and regular rhythm.                 Pulmonary/Chest: Effort normal and breath sounds without rales or wheezing.                Abd:  Soft, NT, ND, + BS, no organomegaly               Neurological: Pt is alert. At baseline orientation, motor grossly intact               Skin: Skin is warm. No rashes, no other new lesions, LE edema - none               Psychiatric: Pt behavior is normal without agitation   Micro: none  Cardiac tracings I have personally interpreted today:  none  Pertinent Radiological findings (summarize): none   Lab Results  Component Value Date   WBC 9.1 11/05/2022   HGB 12.9 11/05/2022   HCT 38.4 11/05/2022   PLT 235.0 Repeated and verified X2. 11/05/2022   GLUCOSE 88 11/05/2022   CHOL 185 11/05/2022  TRIG 143.0 11/05/2022   HDL 55.80 11/05/2022   LDLCALC 100 (H) 11/05/2022   ALT 32 11/05/2022   AST 25 11/05/2022   NA 135 11/05/2022   K 4.1 11/05/2022   CL 100 11/05/2022   CREATININE 1.10 11/05/2022   BUN 21 11/05/2022   CO2 30 11/05/2022   TSH 2.91 05/13/2022   HGBA1C 6.0 11/05/2022    Assessment/Plan:  Deborah Schultz is a 77 y.o. Black or African American [2] female with  has a past medical history of ALOPECIA (06/06/2008), Anemia, ANEMIA-IRON DEFICIENCY (06/06/2008), BICEPS TENDINITIS, LEFT (05/06/2010), BRONCHITIS, ACUTE (06/18/2008), CALLUSES, FEET, BILATERAL (06/06/2008), Chronic kidney disease, Chronic pruritus, CONSTIPATION (06/06/2008), Cough (06/18/2008), Degenerative disc disease, cervical, Diverticulitis, DIVERTICULITIS, HX OF (06/06/2008), ELBOW PAIN (05/26/2010), FATIGUE (06/18/2008), HLD (hyperlipidemia), HTN (hypertension), HYPERLIPIDEMIA (06/06/2008), OAB (overactive bladder), OVERACTIVE BLADDER (06/06/2008), PERIPHERAL EDEMA (02/19/2009), Peripheral edema (02/26/2011), Right carpal tunnel syndrome, and SINUSITIS- ACUTE-NOS (01/08/2009).  CKD (chronic kidney disease) stage 3, GFR 30-59 ml/min (HCC) Lab Results  Component Value Date   CREATININE 1.10 11/05/2022   Mild worsening overall over baseline 1-2 yrs ago, cont to avoid nephrotoxins and current med tx, pt declines renal referral for now   Essential hypertension BP Readings from Last 3 Encounters:  11/12/22 120/80  10/20/22 138/84  10/08/22 124/68   Stable, pt to continue medical treatment norvasc 5 mg every day, aldactone 25 - 1/2 every day, lasix 40 every day, micardis 80 qd   Hyperglycemia Lab Results  Component Value Date   HGBA1C 6.0 11/05/2022   Stable, pt to continue current medical treatment  - diet, wt control   Hyperlipidemia Lab Results  Component Value Date   LDLCALC 100 (H) 11/05/2022   uncontrolled, pt to continue current statin pravachol 40 every day, declines change for now   OVERACTIVE BLADDER With increased urinary incontinence and freq with increased diuretic it seems, ok for increased oxybutinin er 10 every day and refer urology to r/o other cause  Peripheral edema Currently resolved, cont same tx  Followup: Return in about 6 months (around  05/15/2023).  Oliver Barre, MD 11/15/2022 7:30 AM [facility]

## 2022-11-12 NOTE — Patient Instructions (Addendum)
Ok to change the oxybutinin to the 10 mg ER medication - 1 per day  Please continue all other medications as before.  Please have the pharmacy call with any other refills you may need.  Please continue your efforts at being more active, low cholesterol diet, and weight control.  Please keep your appointments with your specialists as you may have planned - Cardiology later this yr  You will be contacted regarding the referral for: Urology   Please go to the LAB at the blood drawing area for the tests to be done - just the urine testing today  You will be contacted by phone if any changes need to be made immediately.  Otherwise, you will receive a letter about your results with an explanation, but please check with MyChart first.  Please make an Appointment to return in 6 months, or sooner if needed, also with Lab Appointment for testing done 3-5 days before at the FIRST FLOOR Lab (so this is for TWO appointments - please see the scheduling desk as you leave)

## 2022-11-12 NOTE — Progress Notes (Signed)
The test results show that your current treatment is OK, as the tests are stable.  Please continue the same plan.  There is no other need for change of treatment or further evaluation based on these results, at this time.  thanks 

## 2022-11-14 ENCOUNTER — Other Ambulatory Visit: Payer: Self-pay | Admitting: Internal Medicine

## 2022-11-15 ENCOUNTER — Encounter: Payer: Self-pay | Admitting: Internal Medicine

## 2022-11-15 NOTE — Assessment & Plan Note (Signed)
Lab Results  Component Value Date   HGBA1C 6.0 11/05/2022   Stable, pt to continue current medical treatment  - diet, wt control

## 2022-11-15 NOTE — Assessment & Plan Note (Signed)
With increased urinary incontinence and freq with increased diuretic it seems, ok for increased oxybutinin er 10 every day and refer urology to r/o other cause

## 2022-11-15 NOTE — Addendum Note (Signed)
Addended by: Corwin Levins on: 11/15/2022 07:31 AM   Modules accepted: Orders

## 2022-11-15 NOTE — Assessment & Plan Note (Signed)
BP Readings from Last 3 Encounters:  11/12/22 120/80  10/20/22 138/84  10/08/22 124/68   Stable, pt to continue medical treatment norvasc 5 mg every day, aldactone 25 - 1/2 every day, lasix 40 every day, micardis 80 qd

## 2022-11-15 NOTE — Assessment & Plan Note (Signed)
Lab Results  Component Value Date   CREATININE 1.10 11/05/2022   Mild worsening overall over baseline 1-2 yrs ago, cont to avoid nephrotoxins and current med tx, pt declines renal referral for now

## 2022-11-15 NOTE — Assessment & Plan Note (Signed)
Lab Results  Component Value Date   LDLCALC 100 (H) 11/05/2022   uncontrolled, pt to continue current statin pravachol 40 every day, declines change for now

## 2022-11-15 NOTE — Assessment & Plan Note (Signed)
Currently resolved, cont same tx

## 2022-11-16 ENCOUNTER — Telehealth: Payer: Self-pay | Admitting: Radiology

## 2022-11-16 ENCOUNTER — Other Ambulatory Visit: Payer: Self-pay

## 2022-11-16 NOTE — Telephone Encounter (Signed)
Contacted Deborah Schultz to schedule their annual wellness visit. Patient declined to schedule AWV at this time.  Deveron Shamoon K. CMA

## 2022-11-17 ENCOUNTER — Other Ambulatory Visit: Payer: Self-pay | Admitting: Internal Medicine

## 2022-11-17 DIAGNOSIS — H18413 Arcus senilis, bilateral: Secondary | ICD-10-CM | POA: Diagnosis not present

## 2022-11-17 DIAGNOSIS — H40013 Open angle with borderline findings, low risk, bilateral: Secondary | ICD-10-CM | POA: Diagnosis not present

## 2022-11-17 DIAGNOSIS — Z961 Presence of intraocular lens: Secondary | ICD-10-CM | POA: Diagnosis not present

## 2022-11-17 DIAGNOSIS — Z1231 Encounter for screening mammogram for malignant neoplasm of breast: Secondary | ICD-10-CM

## 2022-11-17 DIAGNOSIS — H26493 Other secondary cataract, bilateral: Secondary | ICD-10-CM | POA: Diagnosis not present

## 2022-11-17 DIAGNOSIS — H26492 Other secondary cataract, left eye: Secondary | ICD-10-CM | POA: Diagnosis not present

## 2022-11-21 ENCOUNTER — Other Ambulatory Visit: Payer: Self-pay | Admitting: Internal Medicine

## 2022-11-24 ENCOUNTER — Encounter: Payer: Self-pay | Admitting: Internal Medicine

## 2022-11-24 ENCOUNTER — Ambulatory Visit (AMBULATORY_SURGERY_CENTER): Payer: PPO | Admitting: Internal Medicine

## 2022-11-24 VITALS — BP 137/45 | HR 116 | Temp 98.4°F | Resp 26 | Ht 61.0 in | Wt 171.0 lb

## 2022-11-24 DIAGNOSIS — Z1211 Encounter for screening for malignant neoplasm of colon: Secondary | ICD-10-CM

## 2022-11-24 DIAGNOSIS — K529 Noninfective gastroenteritis and colitis, unspecified: Secondary | ICD-10-CM | POA: Diagnosis not present

## 2022-11-24 DIAGNOSIS — I10 Essential (primary) hypertension: Secondary | ICD-10-CM | POA: Diagnosis not present

## 2022-11-24 DIAGNOSIS — E785 Hyperlipidemia, unspecified: Secondary | ICD-10-CM | POA: Diagnosis not present

## 2022-11-24 DIAGNOSIS — K633 Ulcer of intestine: Secondary | ICD-10-CM

## 2022-11-24 MED ORDER — SODIUM CHLORIDE 0.9 % IV SOLN
500.0000 mL | Freq: Once | INTRAVENOUS | Status: DC
Start: 2022-11-24 — End: 2022-11-24

## 2022-11-24 NOTE — Progress Notes (Signed)
VS by DT  Pt's states no medical or surgical changes since previsit or office visit.  

## 2022-11-24 NOTE — Progress Notes (Signed)
GASTROENTEROLOGY PROCEDURE H&P NOTE   Primary Care Physician: Corwin Levins, MD    Reason for Procedure:   Colon cancer screening  Plan:    Colonoscopy  Patient is appropriate for endoscopic procedure(s) in the ambulatory ([facility]) setting.  The nature of the procedure, as well as the risks, benefits, and alternatives were carefully and thoroughly reviewed with the patient. Ample time for discussion and questions allowed. The patient understood, was satisfied, and agreed to proceed.     HPI: Deborah Schultz is a 77 y.o. female who presents for colonoscopy for colon cancer screening. Denies blood in stools, changes in bowel habits, or unintentional weight loss. Denies family history of colon cancer. Last colonoscopy was in 2014 with moderated diverticulosis throughout the colon.  Past Medical History:  Diagnosis Date   ALOPECIA 06/06/2008   Anemia    iron deficiency   ANEMIA-IRON DEFICIENCY 06/06/2008   BICEPS TENDINITIS, LEFT 05/06/2010   BRONCHITIS, ACUTE 06/18/2008   CALLUSES, FEET, BILATERAL 06/06/2008   Chronic kidney disease    Chronic pruritus    CONSTIPATION 06/06/2008   Cough 06/18/2008   Degenerative disc disease, cervical    Diverticulitis    DIVERTICULITIS, HX OF 06/06/2008   ELBOW PAIN 05/26/2010   FATIGUE 06/18/2008   HLD (hyperlipidemia)    HTN (hypertension)    HYPERLIPIDEMIA 06/06/2008   OAB (overactive bladder)    OVERACTIVE BLADDER 06/06/2008   PERIPHERAL EDEMA 02/19/2009   Peripheral edema 02/26/2011   Right carpal tunnel syndrome    SINUSITIS- ACUTE-NOS 01/08/2009    Past Surgical History:  Procedure Laterality Date   ABDOMINAL HYSTERECTOMY     APPENDECTOMY     BREAST BIOPSY     BUNIONECTOMY     CATARACT EXTRACTION     bilateral 11/2009-no longer wears glasses   ECTOPIC PREGNANCY SURGERY     OOPHORECTOMY     right thumb surgery      Prior to Admission medications   Medication Sig Start Date End Date Taking? Authorizing  Provider  amLODipine (NORVASC) 5 MG tablet Take 1 tablet by mouth once daily 11/16/22   Corwin Levins, MD  aspirin 81 MG tablet Take 81 mg by mouth daily.      [provider]  cholecalciferol (VITAMIN D3) 25 MCG (1000 UNIT) tablet Take 1,000 Units by mouth daily.    [provider]  estradiol (ESTRACE) 1 MG tablet Take 1 tablet by mouth once daily 02/10/22   Corwin Levins, MD  furosemide (LASIX) 40 MG tablet TAKE 1 TABLET BY MOUTH TWICE DAILY. SECOND DOSE SHOULD BE TAKEN AS NEEDED FOR PERSISTENT SWELLING 10/26/22   Corwin Levins, MD  hydrocortisone 2.5 % cream Apply 1 Application topically 2 (two) times daily as needed.    [provider]  Multiple Vitamin (MULTIVITAMIN) tablet Take 1 tablet by mouth daily.      [provider]  Na Sulfate-K Sulfate-Mg Sulf 17.5-3.13-1.6 GM/177ML SOLN Use as directed; may use generic; goodrx card if insurance will not cover generic 10/21/22   Imogene Burn, MD  oxybutynin (DITROPAN XL) 10 MG 24 hr tablet Take 1 tablet (10 mg total) by mouth at bedtime. 11/12/22   Corwin Levins, MD  pravastatin (PRAVACHOL) 40 MG tablet Take 1 tablet by mouth once daily 11/23/22   Corwin Levins, MD  propranolol (INNOPRAN XL) 80 MG 24 hr capsule Take 1 capsule (80 mg total) by mouth daily. 07/10/22   Pincus Sanes, MD  spironolactone (ALDACTONE)  25 MG tablet Take 1/2 (one-half) tablet by mouth once daily 11/02/22   Corwin Levins, MD  telmisartan (MICARDIS) 80 MG tablet Take 1 tablet (80 mg total) by mouth daily. 05/14/22   Corwin Levins, MD  triamcinolone cream (KENALOG) 0.1 % Apply 1 application topically 2 (two) times daily. 07/08/16   Corwin Levins, MD    Current Outpatient Medications  Medication Sig Dispense Refill   amLODipine (NORVASC) 5 MG tablet Take 1 tablet by mouth once daily 90 tablet 0   aspirin 81 MG tablet Take 81 mg by mouth daily.       cholecalciferol (VITAMIN D3) 25 MCG (1000 UNIT) tablet Take 1,000 Units by mouth daily.     estradiol  (ESTRACE) 1 MG tablet Take 1 tablet by mouth once daily 90 tablet 2   furosemide (LASIX) 40 MG tablet TAKE 1 TABLET BY MOUTH TWICE DAILY. SECOND DOSE SHOULD BE TAKEN AS NEEDED FOR PERSISTENT SWELLING 180 tablet 0   hydrocortisone 2.5 % cream Apply 1 Application topically 2 (two) times daily as needed.     Multiple Vitamin (MULTIVITAMIN) tablet Take 1 tablet by mouth daily.       Na Sulfate-K Sulfate-Mg Sulf 17.5-3.13-1.6 GM/177ML SOLN Use as directed; may use generic; goodrx card if insurance will not cover generic 354 mL 0   oxybutynin (DITROPAN XL) 10 MG 24 hr tablet Take 1 tablet (10 mg total) by mouth at bedtime. 90 tablet 3   pravastatin (PRAVACHOL) 40 MG tablet Take 1 tablet by mouth once daily 90 tablet 1   propranolol (INNOPRAN XL) 80 MG 24 hr capsule Take 1 capsule (80 mg total) by mouth daily.     spironolactone (ALDACTONE) 25 MG tablet Take 1/2 (one-half) tablet by mouth once daily 15 tablet 0   telmisartan (MICARDIS) 80 MG tablet Take 1 tablet (80 mg total) by mouth daily. 90 tablet 3   triamcinolone cream (KENALOG) 0.1 % Apply 1 application topically 2 (two) times daily. 30 g 0   Current Facility-Administered Medications  Medication Dose Route Frequency Provider Last Rate Last Admin   0.9 %  sodium chloride infusion  500 mL Intravenous Once Imogene Burn, MD        Allergies as of 11/24/2022 - Review Complete 11/24/2022  Allergen Reaction Noted   Nystatin Rash 08/02/2014   Sulfa antibiotics Anaphylaxis and Other (See Comments) 06/06/2008   Esomeprazole magnesium  05/26/2010   Fexofenadine  05/26/2010   Sulfonamide derivatives  06/06/2008    Family History  Problem Relation Age of Onset   Hypertension Mother    Ovarian cancer Mother    Diabetes Brother    Cancer Maternal Aunt        unspecif if mat or pat   Cancer Maternal Uncle        unspecif if mat or pat   Colon cancer Neg Hx    Breast cancer Neg Hx    Rectal cancer Neg Hx    Stomach cancer Neg Hx     Social  History   Socioeconomic History   Marital status: Married    Spouse name: Not on file   Number of children: Not on file   Years of education: Not on file   Highest education level: Some college, no degree  Occupational History   Not on file  Tobacco Use   Smoking status: Never   Smokeless tobacco: Never  Vaping Use   Vaping status: Never Used  Substance and Sexual Activity  Alcohol use: Yes    Alcohol/week: 0.0 standard drinks of alcohol    Comment: rare   Drug use: No   Sexual activity: Not on file  Other Topics Concern   Not on file  Social History Narrative   Work- Conservation officer, 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    Social Determinants of Health   Financial Resource Strain: Low Risk  (10/08/2022)   Overall Financial Resource Strain (CARDIA)    Difficulty of Paying Living Expenses: Not hard at all  Food Insecurity: No Food Insecurity (10/08/2022)   Hunger Vital Sign    Worried About Running Out of Food in the Last Year: Never true    Ran Out of Food in the Last Year: Never true  Transportation Needs: No Transportation Needs (10/08/2022)   PRAPARE - Administrator, Civil Service (Medical): No    Lack of Transportation (Non-Medical): No  Physical Activity: Unknown (10/08/2022)   Exercise Vital Sign    Days of Exercise per Week: Patient declined    Minutes of Exercise per Session: Not on file  Stress: No Stress Concern Present (10/08/2022)   [employer] of Occupational Health - Occupational Stress Questionnaire    Feeling of Stress : Not at all  Social Connections: Socially Integrated (10/08/2022)   Social Connection and Isolation Panel [NHANES]    Frequency of Communication with Friends and Family: More than three times a week    Frequency of Social Gatherings with Friends and Family: Once a week    Attends Religious Services: More than 4 times per year    Active Member of [employer] or Organizations: Yes    Attends Hospital doctor: More than 4 times per year    Marital Status: Married  Catering manager Violence: Not on file    Physical Exam: Vital signs in last 24 hours: BP (!) 150/64   Pulse 60   Temp 98.4 F (36.9 C) (Skin)   Ht 5\' 1"  (1.549 m)   Wt 171 lb (77.6 kg)   SpO2 100%   BMI 32.31 kg/m  GEN: NAD EYE: Sclerae anicteric ENT: MMM CV: Non-tachycardic Pulm: No increased work of breathing GI: Soft, NT/ND NEURO:  Alert & Oriented   Eulah Pont, MD [facility]  11/24/2022 9:48 AM

## 2022-11-24 NOTE — Patient Instructions (Signed)
   Handouts on diverticulosis,& hemorrhoids given to you today.   Await pathology results on transverse colon erosions biopsied      YOU HAD AN ENDOSCOPIC PROCEDURE TODAY AT THE [facility]:   Refer to the procedure report that was given to you for any specific questions about what was found during the examination.  If the procedure report does not answer your questions, please call your gastroenterologist to clarify.  If you requested that your care partner not be given the details of your procedure findings, then the procedure report has been included in a sealed envelope for you to review at your convenience later.  YOU SHOULD EXPECT: Some feelings of bloating in the abdomen. Passage of more gas than usual.  Walking can help get rid of the air that was put into your GI tract during the procedure and reduce the bloating. If you had a lower endoscopy (such as a colonoscopy or flexible sigmoidoscopy) you may notice spotting of blood in your stool or on the toilet paper. If you underwent a bowel prep for your procedure, you may not have a normal bowel movement for a few days.  Please Note:  You might notice some irritation and congestion in your nose or some drainage.  This is from the oxygen used during your procedure.  There is no need for concern and it should clear up in a day or so.  SYMPTOMS TO REPORT IMMEDIATELY:  Following lower endoscopy (colonoscopy or flexible sigmoidoscopy):  Excessive amounts of blood in the stool  Significant tenderness or worsening of abdominal pains  Swelling of the abdomen that is new, acute  Fever of 100F or higher   For urgent or emergent issues, a gastroenterologist can be reached at any hour by calling [phone]. Do not use MyChart messaging for urgent concerns.    DIET:  We do recommend a small meal at first, but then you may proceed to your regular diet.  Drink plenty of fluids but you should avoid alcoholic beverages for 24  hours.  ACTIVITY:  You should plan to take it easy for the rest of today and you should NOT DRIVE or use heavy machinery until tomorrow (because of the sedation medicines used during the test).    FOLLOW UP: Our staff will call the number listed on your records the next business day following your procedure.  We will call around 7:15- 8:00 am to check on you and address any questions or concerns that you may have regarding the information given to you following your procedure. If we do not reach you, we will leave a message.     If any biopsies were taken you will be contacted by phone or by letter within the next 1-3 weeks.  Please call us at [phone] if you have not heard about the biopsies in 3 weeks.    SIGNATURES/CONFIDENTIALITY: You and/or your care partner have signed paperwork which will be entered into your electronic medical record.  These signatures attest to the fact that that the information above on your After Visit Summary has been reviewed and is understood.  Full responsibility of the confidentiality of this discharge information lies with you and/or your care-partner.

## 2022-11-24 NOTE — Progress Notes (Signed)
Called to room to assist during endoscopic procedure.  Patient ID and intended procedure confirmed with present staff. Received instructions for my participation in the procedure from the performing physician.  

## 2022-11-24 NOTE — Progress Notes (Signed)
Vss nad trans to pacu 

## 2022-11-24 NOTE — Op Note (Signed)
[facility] Patient Name: Deborah Schultz Procedure Date: 11/24/2022 9:37 AM MRN: [id number] Endoscopist: Madelyn Brunner [place] , , [id number] Age: 77 Referring MD:  Date of Birth: 1946/02/11 Gender: Female Account #: [id number] Procedure:                Colonoscopy Indications:              Screening for colorectal malignant neoplasm Medicines:                Monitored Anesthesia Care Procedure:                Pre-Anesthesia Assessment:                           - Prior to the procedure, a History and Physical                            was performed, and patient medications and                            allergies were reviewed. The patient's tolerance of                            previous anesthesia was also reviewed. The risks                            and benefits of the procedure and the sedation                            options and risks were discussed with the patient.                            All questions were answered, and informed consent                            was obtained. Prior Anticoagulants: The patient has                            taken no anticoagulant or antiplatelet agents. ASA                            Grade Assessment: III - A patient with severe                            systemic disease. After reviewing the risks and                            benefits, the patient was deemed in satisfactory                            condition to undergo the procedure.                           After obtaining informed consent, the colonoscope  was passed under direct vision. Throughout the                            procedure, the patient's blood pressure, pulse, and                            oxygen saturations were monitored continuously. The                            Olympus CF-HQ190L [id number]) Colonoscope was                            introduced through the anus and advanced to the the                            cecum,  identified by appendiceal orifice and                            ileocecal valve. The colonoscopy was performed                            without difficulty. The patient tolerated the                            procedure well. The quality of the bowel                            preparation was good. The ileocecal valve,                            appendiceal orifice, and rectum were photographed. Scope In: 10:07:58 AM Scope Out: 10:31:26 AM Scope Withdrawal Time: 0 hours 14 minutes 13 seconds  Total Procedure Duration: 0 hours 23 minutes 28 seconds  Findings:                 Multiple diverticula were found in the entire colon.                           One localized non-bleeding erosion was found in the                            transverse colon. No stigmata of recent bleeding                            were seen. Biopsies were taken with a cold forceps                            for histology.                           Non-bleeding internal hemorrhoids were found during                            retroflexion. Complications:  No immediate complications. Estimated Blood Loss:     Estimated blood loss was minimal. Impression:               - Diverticulosis in the entire examined colon.                           - One erosion in the transverse colon. Biopsied.                           - Non-bleeding internal hemorrhoids. Recommendation:           - Discharge patient to home (with escort).                           - Await pathology results.                           - The findings and recommendations were discussed                            with the patient. Dr Particia Lather "[place]" [place],  11/24/2022 10:37:49 AM

## 2022-11-25 ENCOUNTER — Telehealth: Payer: Self-pay

## 2022-11-25 DIAGNOSIS — H26492 Other secondary cataract, left eye: Secondary | ICD-10-CM | POA: Diagnosis not present

## 2022-11-25 NOTE — Telephone Encounter (Signed)
  Follow up Call-     11/24/2022    9:43 AM  Call back number  Post procedure Call Back phone  # [phone]  Permission to leave phone message Yes     Patient questions:  Do you have a fever, pain , or abdominal swelling? No. Pain Score  0 *  Have you tolerated food without any problems? Yes.    Have you been able to return to your normal activities? Yes.    Do you have any questions about your discharge instructions: Diet   No. Medications  No. Follow up visit  No.  Do you have questions or concerns about your Care? No.  Actions: * If pain score is 4 or above: No action needed, pain <4.

## 2022-11-27 ENCOUNTER — Telehealth: Payer: Self-pay | Admitting: Internal Medicine

## 2022-11-27 MED ORDER — PROPRANOLOL HCL ER BEADS 80 MG PO CP24: 80.0000 mg | ORAL_CAPSULE | Freq: Every day | ORAL | 1 refills | Status: DC

## 2022-11-27 NOTE — Telephone Encounter (Signed)
Called pt to verify. Pt states she take med everyday. She only have 4 left. Verified pharmacy sent refill to [facility].Marland KitchenRaechel Chute

## 2022-11-27 NOTE — Telephone Encounter (Signed)
Patient called to ask why her propranolol (INNOPRAN XL) 80 MG 24 hr capsule was denied for a refill. The reason said refill not appropriate and she had an OV on 11/12/2022. Patient would like a call back at [phone].

## 2022-11-28 ENCOUNTER — Other Ambulatory Visit: Payer: Self-pay | Admitting: Internal Medicine

## 2022-11-30 ENCOUNTER — Other Ambulatory Visit: Payer: Self-pay

## 2022-12-07 ENCOUNTER — Encounter: Payer: Self-pay | Admitting: Internal Medicine

## 2022-12-19 ENCOUNTER — Other Ambulatory Visit: Payer: Self-pay | Admitting: Internal Medicine

## 2022-12-22 ENCOUNTER — Other Ambulatory Visit: Payer: Self-pay

## 2022-12-22 ENCOUNTER — Encounter: Payer: Self-pay | Admitting: Internal Medicine

## 2022-12-23 ENCOUNTER — Other Ambulatory Visit: Payer: Self-pay | Admitting: Internal Medicine

## 2022-12-23 MED ORDER — MOLNUPIRAVIR EUA 200MG CAPSULE: 4.0000 | ORAL_CAPSULE | Freq: Two times a day (BID) | ORAL | 0 refills | Status: AC

## 2022-12-23 NOTE — Telephone Encounter (Signed)
Patient's husband called to see if Dr. Jonny Ruiz would like for patient to have an appointment. They would like a call back at [phone].

## 2022-12-28 ENCOUNTER — Encounter: Payer: Self-pay | Admitting: Internal Medicine

## 2022-12-28 ENCOUNTER — Other Ambulatory Visit: Payer: Self-pay | Admitting: Internal Medicine

## 2022-12-28 ENCOUNTER — Other Ambulatory Visit: Payer: Self-pay

## 2022-12-29 ENCOUNTER — Other Ambulatory Visit: Payer: Self-pay

## 2022-12-29 MED ORDER — SPIRONOLACTONE 25 MG PO TABS: 12.5000 mg | ORAL_TABLET | Freq: Once | ORAL | 0 refills | Status: DC

## 2023-01-01 ENCOUNTER — Ambulatory Visit
Admission: RE | Admit: 2023-01-01 | Discharge: 2023-01-01 | Disposition: A | Discharge status: Home / Self Care | Payer: PPO | Source: Ambulatory Visit | Attending: Internal Medicine | Admitting: Internal Medicine

## 2023-01-01 DIAGNOSIS — Z1231 Encounter for screening mammogram for malignant neoplasm of breast: Secondary | ICD-10-CM | POA: Diagnosis not present

## 2023-01-16 ENCOUNTER — Other Ambulatory Visit: Payer: Self-pay | Admitting: Internal Medicine

## 2023-01-18 ENCOUNTER — Other Ambulatory Visit: Payer: Self-pay

## 2023-01-20 ENCOUNTER — Other Ambulatory Visit: Payer: Self-pay

## 2023-01-20 ENCOUNTER — Ambulatory Visit: Payer: PPO | Admitting: Family Medicine

## 2023-01-20 ENCOUNTER — Ambulatory Visit (INDEPENDENT_AMBULATORY_CARE_PROVIDER_SITE_OTHER): Payer: PPO

## 2023-01-20 VITALS — BP 122/62 | HR 59 | Ht 61.0 in | Wt 170.0 lb

## 2023-01-20 DIAGNOSIS — M1711 Unilateral primary osteoarthritis, right knee: Secondary | ICD-10-CM | POA: Diagnosis not present

## 2023-01-20 DIAGNOSIS — M25561 Pain in right knee: Secondary | ICD-10-CM | POA: Diagnosis not present

## 2023-01-20 DIAGNOSIS — G8929 Other chronic pain: Secondary | ICD-10-CM

## 2023-01-20 NOTE — Progress Notes (Signed)
Rubin Payor, PhD, LAT, ATC acting as a scribe for Clementeen Graham, MD.  Deborah Schultz is a 77 y.o. female who presents to [facility] at [facility] today for R knee pain. Pt was last seen by Dr. Denyse Amass on 05/25/22 for her R knee. Last steroid injection was on 04/21/22.  R knee pain ongoing for about 3 wks. She tripped on the leg of a stool, falling onto her R knee. Pt locates pain to the anterior-medial aspect of her R knee. Pain is still lingering  Dx imaging: 04/21/22 R knee XR             11/23/20 R tib/fib XR  Pertinent review of systems: No fevers or chills  Relevant historical information: Hypertension   Exam:  BP 122/62   Pulse (!) 59   Ht 5\' 1"  (1.549 m)   Wt 170 lb (77.1 kg)   SpO2 98%   BMI 32.12 kg/m  General: Well Developed, well nourished, and in no acute distress.   MSK: Right knee normal-appearing Normal motion. Tender palpation anterior medial knee. Stable ligamentous exam. Intact strength.    Lab and Radiology Results  Procedure: Real-time Ultrasound Guided Injection of right knee joint superior lateral patella space Device: Philips Affiniti 50G/GE Logiq Images permanently stored and available for review in PACS Verbal informed consent obtained.  Discussed risks and benefits of procedure. Warned about infection, bleeding, hyperglycemia damage to structures among others. Patient expresses understanding and agreement Time-out conducted.   Noted no overlying erythema, induration, or other signs of local infection.   Skin prepped in a sterile fashion.   Local anesthesia: Topical Ethyl chloride.   With sterile technique and under real time ultrasound guidance: 40 mg of Kenalog and 2 mL of Marcaine injected into knee joint. Fluid seen entering the joint capsule.   Completed without difficulty   Pain immediately resolved suggesting accurate placement of the medication.   Advised to call if fevers/chills, erythema, induration, drainage,  or persistent bleeding.   Images permanently stored and available for review in the ultrasound unit.  Impression: Technically successful ultrasound guided injection.   X-ray images right knee obtained today personally and independently interpreted. 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  No acute fractures are visible. Await formal radiology review    Assessment and Plan: 77 y.o. female with right knee pain thought to be due to medial DJD exacerbated by a fall onto the anterior knee.  Plan for steroid injection and continued home exercise program.  Recommend also Voltaren gel and Tylenol.  Check back as needed.   PDMP not reviewed this encounter. Orders Placed This Encounter  Procedures   DG Knee AP/LAT W/Sunrise Right    Standing Status:   Future    Number of Occurrences:   1    Standing Expiration Date:   02/20/2023    Order Specific Question:   Reason for Exam (SYMPTOM  OR DIAGNOSIS REQUIRED)    Answer:   right knee pain    Order Specific Question:   Preferred imaging location?    Answer:   [facility]   [place] LIMITED JOINT SPACE STRUCTURES LOW RIGHT(NO LINKED CHARGES)    Order Specific Question:   Reason for Exam (SYMPTOM  OR DIAGNOSIS REQUIRED)    Answer:   right knee pain    Order Specific Question:   Preferred imaging location?    Answer:   [facility]   No orders of the defined types were placed  in this encounter.    Discussed warning signs or symptoms. Please see discharge instructions. Patient expresses understanding.   The above documentation has been reviewed and is accurate and complete Clementeen Graham, M.D.

## 2023-01-20 NOTE — Patient Instructions (Signed)
Thank you for coming in today.   You received an injection today. Seek immediate medical attention if the joint becomes red, extremely painful, or is oozing fluid.   Let me know how it goes.  

## 2023-02-02 ENCOUNTER — Encounter: Payer: Self-pay | Admitting: Internal Medicine

## 2023-02-02 MED ORDER — SPIRONOLACTONE 25 MG PO TABS: 12.5000 mg | ORAL_TABLET | Freq: Once | ORAL | 2 refills | Status: DC

## 2023-02-03 ENCOUNTER — Other Ambulatory Visit: Payer: Self-pay

## 2023-02-03 ENCOUNTER — Other Ambulatory Visit: Payer: Self-pay | Admitting: Internal Medicine

## 2023-02-08 NOTE — Progress Notes (Signed)
Right knee x-ray shows some mild arthritis

## 2023-02-24 ENCOUNTER — Ambulatory Visit: Payer: PPO

## 2023-02-24 DIAGNOSIS — Z23 Encounter for immunization: Secondary | ICD-10-CM | POA: Diagnosis not present

## 2023-02-24 NOTE — Progress Notes (Signed)
Patient here for HD flu shot. Tolerated well with no complications.

## 2023-03-01 ENCOUNTER — Ambulatory Visit: Payer: PPO | Admitting: Dermatology

## 2023-03-01 ENCOUNTER — Encounter: Payer: Self-pay | Admitting: Dermatology

## 2023-03-01 DIAGNOSIS — L309 Dermatitis, unspecified: Secondary | ICD-10-CM | POA: Diagnosis not present

## 2023-03-01 DIAGNOSIS — L299 Pruritus, unspecified: Secondary | ICD-10-CM | POA: Diagnosis not present

## 2023-03-01 MED ORDER — TRIAMCINOLONE ACETONIDE 0.1 % EX CREA: 1.0000 | TOPICAL_CREAM | Freq: Two times a day (BID) | CUTANEOUS | 2 refills | Status: DC

## 2023-03-01 NOTE — Progress Notes (Addendum)
   New Patient Visit   Subjective  Deborah Schultz is a 77 y.o. female who presents for the following: New Pt  Patient states she has break outs located at the left arm & right shin that she would like to have examined. Patient reports the areas have been there off and on her entire lifetime. She reports the areas are bothersome. Patient rates irritation (itching) 10 out of 10. She states that the areas have spread. Patient reports she has not previously been treated for these areas. Patient denies Hx of bx. Patient denies family history of skin cancer(s).  The patient has spots, moles and lesions to be evaluated, some may be new or changing and the patient may have concern these could be cancer.   The following portions of the chart were reviewed this encounter and updated as appropriate: medications, allergies, medical history  Review of Systems:  No other skin or systemic complaints except as noted in HPI or Assessment and Plan.  Objective  Well appearing patient in no apparent distress; mood and affect are within normal limits.  A focused examination was performed of the following areas:   Relevant exam findings are noted in the Assessment and Plan.          Assessment & Plan   1. Eczema (Atopic Dermatitis) - Plan: Prescribe triamcinolone  cream for flare-ups, to be applied twice daily for up to two weeks, followed by a two-week break. Recommend using hydrating cleansers like Dove in the shower and advise using CeraVe Anti-Itch moisturizer after showering. Provide samples of CeraVe Anti-Itch moisturizer. Suggest using Aveeno oatmeal bath packets to help hydrate the skin and decrease flares. Schedule a follow-up appointment in 4 months to assess the effectiveness of the treatment plan.  2. Pruritus (Itching) - Plan: Instruct the patient to apply triamcinolone  cream before bed and upon waking up. If itching persists after 2 weeks, recommend taking Benadryl at nighttime to  help with itching and sleep.     No follow-ups on file.    Documentation: I have reviewed the above documentation for accuracy and completeness, and I agree with the above.   I, Shirron Maranda, CMA, am acting as scribe for [employer], DO.   Delon Lenis, DO

## 2023-03-01 NOTE — Patient Instructions (Addendum)
Hello Araya,  Thank you for visiting our clinic today. We appreciate your dedication to enhancing your skin health. Here is a summary of the essential instructions from today's consultation:  Eczematous Dermatitis - Medication: Initiate treatment with Triamcinolone cream, a prescription medication that is more potent than over-the-counter hydrocortisone.   - Application: Apply to the affected areas twice daily, in the morning and at night.   - Duration: Continue this regimen for up to 2 weeks, then pause for 2 weeks before resuming if needed.  - Skin Care Routine:   - Cleansing: Utilize hydrating cleansers like Dove during your showers.   - Moisturizing: After showers, apply CeraVe Anti-Itch lotion, which is enriched with ceramides and pyridoxine to aid in itch management.   - Bathing: For additional relief, incorporate Aveeno oatmeal bath packets into warm baths to help hydrate the skin and reduce flare-ups.  - Sleep Management: To mitigate scratching during sleep:   - Application: Apply Triamcinolone cream before bedtime.   - Medication: Consider using Benadryl at night to decrease itching and promote better sleep.  - Follow-Up: We are scheduled to meet again in four months to assess the effectiveness of your treatment plan and adjust as necessary.  We have provided samples of CeraVe Anti-Itch lotion and Aveeno oatmeal bath packets for your home use. Please ensure to dispose of any expired medications and replace them as required.  Thank you once again for your visit today. I am eager to observe the improvements in your skin at our next meeting.  Warm regards,  Dr. [facility]       Important Information  Due to recent changes in healthcare laws, you may see results of your pathology and/or laboratory studies on MyChart before the doctors have had a chance to review them. We understand that in some cases there may be results that are confusing or concerning to  you. Please understand that not all results are received at the same time and often the doctors may need to interpret multiple results in order to provide you with the best plan of care or course of treatment. Therefore, we ask that you please give [place] 2 business days to thoroughly review all your results before contacting the office for clarification. Should we see a critical lab result, you will be contacted sooner.   If You Need Anything After Your Visit  If you have any questions or concerns for your doctor, please call our main line at [phone] If no one answers, please leave a voicemail as directed and we will return your call as soon as possible. Messages left after 4 pm will be answered the following business day.   You may also send [place] a message via MyChart. We typically respond to MyChart messages within 1-2 business days.  For prescription refills, please ask your pharmacy to contact our office. Our fax number is [phone].  If you have an urgent issue when the clinic is closed that cannot wait until the next business day, you can page your doctor at the number below.    Please note that while we do our best to be available for urgent issues outside of office hours, we are not available 24/7.   If you have an urgent issue and are unable to reach [place], you may choose to seek medical care at your doctor's office, retail clinic, urgent care center, or emergency room.  If you have a medical emergency, please immediately call 911 or go to the emergency department.  In the event of inclement weather, please call our main line at [phone] for an update on the status of any delays or closures.  Dermatology Medication Tips: Please keep the boxes that topical medications come in in order to help keep track of the instructions about where and how to use these. Pharmacies typically print the medication instructions only on the boxes and not directly on the medication tubes.   If your  medication is too expensive, please contact our office at [phone] or send [place] a message through MyChart.   We are unable to tell what your co-pay for medications will be in advance as this is different depending on your insurance coverage. However, we may be able to find a substitute medication at lower cost or fill out paperwork to get insurance to cover a needed medication.   If a prior authorization is required to get your medication covered by your insurance company, please allow [place] 1-2 business days to complete this process.  Drug prices often vary depending on where the prescription is filled and some pharmacies may offer cheaper prices.  The website www.goodrx.com contains coupons for medications through different pharmacies. The prices here do not account for what the cost may be with help from insurance (it may be cheaper with your insurance), but the website can give you the price if you did not use any insurance.  - You can print the associated coupon and take it with your prescription to the pharmacy.  - You may also stop by our office during regular business hours and pick up a GoodRx coupon card.  - If you need your prescription sent electronically to a different pharmacy, notify our office through [facility] or by phone at [phone]

## 2023-03-22 ENCOUNTER — Other Ambulatory Visit: Payer: Self-pay

## 2023-03-22 ENCOUNTER — Encounter: Payer: Self-pay | Admitting: Family Medicine

## 2023-03-22 ENCOUNTER — Ambulatory Visit: Payer: PPO | Admitting: Family Medicine

## 2023-03-22 ENCOUNTER — Ambulatory Visit (INDEPENDENT_AMBULATORY_CARE_PROVIDER_SITE_OTHER): Payer: PPO

## 2023-03-22 VITALS — BP 158/98 | HR 63 | Ht 61.0 in | Wt 169.0 lb

## 2023-03-22 DIAGNOSIS — M25511 Pain in right shoulder: Secondary | ICD-10-CM | POA: Diagnosis not present

## 2023-03-22 DIAGNOSIS — M19011 Primary osteoarthritis, right shoulder: Secondary | ICD-10-CM | POA: Diagnosis not present

## 2023-03-22 DIAGNOSIS — M25551 Pain in right hip: Secondary | ICD-10-CM | POA: Diagnosis not present

## 2023-03-22 NOTE — Progress Notes (Signed)
I, Stevenson Clinch, CMA acting as a scribe for Clementeen Graham, MD.  Deborah Schultz is a 77 y.o. female who presents to [facility] at [facility] today for R hip and shoulder pain. Pt was previously seen by Dr. Denyse Amass on 01/20/23 for R knee pain.  Today, pt c/o R hip pain x 2-3 weeks, no injury. Pt locates pain to posterior aspect wrapping around the anterior aspect. Denies radiating pain into gluteal region, groin, leg, low back. Mechanical sx, snapping, present in the hip. Minimal relief with Advil.   Radiates: no Aggravates: constant, causing night disturbance Treatments tried: Advil  Pt also c/o R shoulder pain x 2-3 weeks. Locates pain to anterior and posterior aspects. Side sleeper, sx causing night disturbance. Intermittent radiating pain into the arm. Denies weakness, decreased grip strength. Notes intermittent n/t in the hands, worse at night. Denies neck pain. Painful ROM reaching up and reaching back.    Pertinent review of systems: No fevers or chills  Relevant historical information: Renal artery stenosis.  History of frozen shoulder.  Social determinants of health: She does not drive.   Exam:  BP (!) 158/98   Pulse 63   Ht 5\' 1"  (1.549 m)   Wt 169 lb (76.7 kg)   SpO2 98%   BMI 31.93 kg/m  General: Well Developed, well nourished, and in no acute distress.   MSK: Right shoulder: Normal appearing Normal motion.  Pain with abduction. Intact strength.  Positive Hawkins and Neer's test.  Right hip: Normal appearing Normal motion.  Tender palpation greater trochanter.  Pain with resisted hip abduction is present.  Lab and Radiology Results  Procedure: Real-time Ultrasound Guided Injection of right shoulder subacromial bursa Device: Philips Affiniti 50G/GE Logiq Images permanently stored and available for review in PACS Verbal informed consent obtained.  Discussed risks and benefits of procedure. Warned about infection, bleeding, hyperglycemia  damage to structures among others. Patient expresses understanding and agreement Time-out conducted.   Noted no overlying erythema, induration, or other signs of local infection.   Skin prepped in a sterile fashion.   Local anesthesia: Topical Ethyl chloride.   With sterile technique and under real time ultrasound guidance: 40 mg of Kenalog and 2 ml of Marcaine injected into the acromial bursa. Fluid seen entering the bursa.   Completed without difficulty   Pain immediately resolved suggesting accurate placement of the medication.   Advised to call if fevers/chills, erythema, induration, drainage, or persistent bleeding.   Images permanently stored and available for review in the ultrasound unit.  Impression: Technically successful ultrasound guided injection.    Procedure: Real-time Ultrasound Guided Injection of right hip greater trochanter bursa Device: Philips Affiniti 50G/GE Logiq Images permanently stored and available for review in PACS Verbal informed consent obtained.  Discussed risks and benefits of procedure. Warned about infection, bleeding, hyperglycemia damage to structures among others. Patient expresses understanding and agreement Time-out conducted.   Noted no overlying erythema, induration, or other signs of local infection.   Skin prepped in a sterile fashion.   Local anesthesia: Topical Ethyl chloride.   With sterile technique and under real time ultrasound guidance: 40 mg of Kenalog and 2 mL of Marcaine injected into greater trochanter bursa. Fluid seen entering the bursa.   Completed without difficulty   Pain immediately resolved suggesting accurate placement of the medication.   Advised to call if fevers/chills, erythema, induration, drainage, or persistent bleeding.   Images permanently stored and available for review in the ultrasound unit.  Impression: Technically successful ultrasound guided injection.    X-ray images right shoulder and right hip obtained  today personally and independently interpreted.  Right shoulder: Mild AC DJD and subacromial spurring.  No acute fractures are visible.  No severe glenohumeral DJD is present.  Right hip: No acute fractures or severe degeneration.  Await formal radiology review    Assessment and Plan: 77 y.o. 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  Plan for subacromial injection and home health physical therapy.  Right lateral hip pain thought to be due to greater trochanteric bursitis.  Again plan for injection and PT.  As she does not drive home health physical therapy needed.  PDMP not reviewed this encounter. Orders Placed This Encounter  Procedures   [place] LIMITED JOINT SPACE STRUCTURES LOW RIGHT(NO LINKED CHARGES)    Order Specific Question:   Reason for Exam (SYMPTOM  OR DIAGNOSIS REQUIRED)    Answer:   right hip and shoulder pain    Order Specific Question:   Preferred imaging location?    Answer:   [facility]   DG HIP UNILAT W OR W/O PELVIS 2-3 VIEWS RIGHT    Standing Status:   Future    Number of Occurrences:   1    Standing Expiration Date:   04/22/2023    Order Specific Question:   Reason for Exam (SYMPTOM  OR DIAGNOSIS REQUIRED)    Answer:   right hip pain    Order Specific Question:   Preferred imaging location?    Answer:   [facility]   DG Shoulder Right    Standing Status:   Future    Number of Occurrences:   1    Standing Expiration Date:   03/21/2024    Order Specific Question:   Reason for Exam (SYMPTOM  OR DIAGNOSIS REQUIRED)    Answer:   right shoulder pain    Order Specific Question:   Preferred imaging location?    Answer:   [facility]   Ambulatory referral to Home Health    Referral Priority:   Routine    Referral Type:   Home Health Care    Requested Specialty:   Home Health Services    Number of Visits Requested:   1   No orders of the defined types were placed  in this encounter.    Discussed warning signs or symptoms. Please see discharge instructions. Patient expresses understanding.   The above documentation has been reviewed and is accurate and complete Clementeen Graham, M.D.

## 2023-03-22 NOTE — Patient Instructions (Addendum)
Thank you for coming in today.  You received an injection today. Seek immediate medical attention if the joint becomes red, extremely painful, or is oozing fluid.  Please get an Xray today before you leave  I've referred you to Home Health Physical Therapy.  Let us know if you don't hear from them in one week.   Follow up in 6 weeks

## 2023-03-25 ENCOUNTER — Encounter (INDEPENDENT_AMBULATORY_CARE_PROVIDER_SITE_OTHER): Payer: Self-pay

## 2023-03-25 DIAGNOSIS — Z1272 Encounter for screening for malignant neoplasm of vagina: Secondary | ICD-10-CM | POA: Diagnosis not present

## 2023-03-25 DIAGNOSIS — N959 Unspecified menopausal and perimenopausal disorder: Secondary | ICD-10-CM | POA: Diagnosis not present

## 2023-03-25 DIAGNOSIS — Z6833 Body mass index (BMI) 33.0-33.9, adult: Secondary | ICD-10-CM | POA: Diagnosis not present

## 2023-03-25 DIAGNOSIS — Z124 Encounter for screening for malignant neoplasm of cervix: Secondary | ICD-10-CM | POA: Diagnosis not present

## 2023-03-26 ENCOUNTER — Encounter: Payer: Self-pay | Admitting: Family Medicine

## 2023-03-30 NOTE — Telephone Encounter (Signed)
Forwarding to Dr. Georgina Snell as Juluis Rainier.

## 2023-04-01 NOTE — Telephone Encounter (Signed)
Forwarding to Dr. Corey to review and advise.  

## 2023-04-05 NOTE — Progress Notes (Signed)
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

## 2023-04-05 NOTE — Progress Notes (Signed)
Right hip x-ray looks okay to radiology

## 2023-04-09 ENCOUNTER — Encounter: Payer: Self-pay | Admitting: Internal Medicine

## 2023-04-09 ENCOUNTER — Ambulatory Visit: Payer: PPO | Attending: Internal Medicine | Admitting: Internal Medicine

## 2023-04-09 VITALS — BP 142/76 | HR 56 | Ht 60.25 in | Wt 170.0 lb

## 2023-04-09 DIAGNOSIS — I10 Essential (primary) hypertension: Secondary | ICD-10-CM | POA: Diagnosis not present

## 2023-04-09 DIAGNOSIS — R002 Palpitations: Secondary | ICD-10-CM

## 2023-04-09 NOTE — Progress Notes (Signed)
Cardiology Office Note:  .   Date:  04/09/2023  ID:  Halima, Darrigo [birth year], MRN [id number] PCP: Corwin Levins, MD  [facility] Providers Cardiologist:  Parke Poisson, MD    History of Present Illness: .   Deborah Schultz is a 77 y.o. female.  Discussed the use of AI scribe software for clinical note transcription with the patient, who gave verbal consent to proceed.  History of Present Illness   The patient, with a history of hypertension, presents with blood pressure fluctuations. She reports that her blood pressure readings at home are "up and down," with systolic readings ranging from 106 to 140. She is unable to identify any specific triggers for these fluctuations. She also reports occasional readings indicating an irregular heartbeat, but these are not consistent and do not correlate with any specific symptoms. She defers cardiac monitor today.   In addition to her cardiovascular concerns, the patient has been seeing a dermatologist, but no further details are provided about this. She denies experiencing any shortness of breath.  The patient's cholesterol levels have been slightly elevated, with her LDL increasing from 97 to 782. She reports a diet that includes occasional fried foods and dairy products, but overall, she believes her diet is relatively healthy. She expresses a willingness to make dietary changes to help lower her cholesterol levels.        ROS: negative except per HPI above.  Studies Reviewed: Marland Kitchen   EKG Interpretation Date/Time:  Friday April 09 2023 09:00:49 EST Ventricular Rate:  61 PR Interval:  212 QRS Duration:  68 QT Interval:  372 QTC Calculation: 374 R Axis:   26  Text Interpretation: Sinus rhythm with 1st degree A-V block Confirmed by Weston Brass ([id number]) on 04/09/2023 9:17:59 AM    Results   LABS TSH: 2.9 (2024) LDL: 100 (2024)  RADIOLOGY Coronary CT: Normal  DIAGNOSTIC EKG: Normal rhythm,  first-degree AV block (04/09/2023) Echocardiogram: Normal     Risk Assessment/Calculations:     HYPERTENSION CONTROL Vitals:   04/09/23 0852 04/09/23 0933  BP: (!) 150/74 (!) 142/76    The patient's blood pressure is elevated above target today.  In order to address the patient's elevated BP: The blood pressure is usually elevated in clinic.  Blood pressures monitored at home have been optimal.          Physical Exam:   VS:  BP (!) 142/76   Pulse (!) 56   Ht 5' 0.25" (1.53 m)   Wt 170 lb (77.1 kg)   SpO2 99%   BMI 32.93 kg/m    Wt Readings from Last 3 Encounters:  04/09/23 170 lb (77.1 kg)  03/22/23 169 lb (76.7 kg)  01/20/23 170 lb (77.1 kg)     Physical Exam   CARDIOVASCULAR: Heart sounds normal. Lungs clear to auscultation.     GEN: Well nourished, well developed in no acute distress NECK: No JVD; No carotid bruits CARDIAC: RRR, no murmurs, rubs, gallops RESPIRATORY:  Clear to auscultation without rales, wheezing or rhonchi  ABDOMEN: Soft, non-tender, non-distended EXTREMITIES:  No edema; No deformity   ASSESSMENT AND PLAN: .    1. Primary hypertension   2. Palpitations     Assessment and Plan    Hypertension Fluctuating blood pressure readings at home, ranging from 106 to 140 systolic. No clear triggers identified for high or low readings. EKG shows normal rhythm with first degree AV block. No current concerns about palpitations or irregular heartbeats. -  Continue current blood pressure management. If elevated again at PCP, consider uptitration of BP therapy -Check blood pressure at home and record readings. -Return in 6 months for follow-up and review of home blood pressure readings.  Hyperlipidemia Slight increase in LDL cholesterol from 97 to 100. Patient's diet includes occasional fried foods and dairy. No plaque or calcium in arteries from previous testing. -Continue current cholesterol management. -Consider dietary modifications to reduce cholesterol  intake, focusing on reducing animal fats and dairy. -Recheck cholesterol levels at next appointment with primary care provider, Dr. Jonny Ruiz, in January.  Palpitations Occasional palpitations noted on home blood pressure monitor, but not daily. No current concerns. EKG shows normal rhythm. -Continue current management. -Consider wearing a heart monitor if palpitations increase in frequency or become concerning. -Advise patient to monitor caffeine intake and hydration levels as these can affect palpitations.  Follow-up in 6 months to review blood pressure and cholesterol management.                 Weston Brass, MD, [facility]  [facility]

## 2023-04-09 NOTE — Patient Instructions (Signed)
Medication Instructions:  Your physician recommends that you continue on your current medications as directed. Please refer to the Current Medication list given to you today.  *If you need a refill on your cardiac medications before your next appointment, please call your pharmacy*   Follow-Up: At [facility], you and your health needs are our priority.  As part of our continuing mission to provide you with exceptional heart care, we have created designated Provider Care Teams.  These Care Teams include your primary Cardiologist (physician) and Advanced Practice Providers (APPs -  Physician Assistants and Nurse Practitioners) who all work together to provide you with the care you need, when you need it.  Your next appointment:   6 month(s)  Provider:   Parke Poisson, MD

## 2023-04-17 ENCOUNTER — Other Ambulatory Visit: Payer: Self-pay | Admitting: Internal Medicine

## 2023-04-19 ENCOUNTER — Other Ambulatory Visit: Payer: Self-pay

## 2023-04-29 ENCOUNTER — Encounter: Payer: Self-pay | Admitting: Family Medicine

## 2023-05-03 ENCOUNTER — Ambulatory Visit: Payer: PPO | Admitting: Family Medicine

## 2023-05-07 ENCOUNTER — Other Ambulatory Visit (INDEPENDENT_AMBULATORY_CARE_PROVIDER_SITE_OTHER): Payer: PPO

## 2023-05-07 DIAGNOSIS — R739 Hyperglycemia, unspecified: Secondary | ICD-10-CM | POA: Diagnosis not present

## 2023-05-07 DIAGNOSIS — E538 Deficiency of other specified B group vitamins: Secondary | ICD-10-CM | POA: Diagnosis not present

## 2023-05-07 DIAGNOSIS — E559 Vitamin D deficiency, unspecified: Secondary | ICD-10-CM | POA: Diagnosis not present

## 2023-05-07 DIAGNOSIS — E7800 Pure hypercholesterolemia, unspecified: Secondary | ICD-10-CM

## 2023-05-07 LAB — VITAMIN D 25 HYDROXY (VIT D DEFICIENCY, FRACTURES): VITD: 63.88 ng/mL (ref 30.00–100.00)

## 2023-05-07 LAB — CBC WITH DIFFERENTIAL/PLATELET
Basophils Absolute: 0.1 10*3/uL (ref 0.0–0.1)
Basophils Relative: 0.5 % (ref 0.0–3.0)
Eosinophils Absolute: 0.1 10*3/uL (ref 0.0–0.7)
Eosinophils Relative: 1.4 % (ref 0.0–5.0)
HCT: 37.4 % (ref 36.0–46.0)
Hemoglobin: 12.4 g/dL (ref 12.0–15.0)
Lymphocytes Relative: 28.5 % (ref 12.0–46.0)
Lymphs Abs: 2.8 10*3/uL (ref 0.7–4.0)
MCHC: 33.2 g/dL (ref 30.0–36.0)
MCV: 94.8 fL (ref 78.0–100.0)
Monocytes Absolute: 1.3 10*3/uL — ABNORMAL HIGH (ref 0.1–1.0)
Monocytes Relative: 12.9 % — ABNORMAL HIGH (ref 3.0–12.0)
Neutro Abs: 5.6 10*3/uL (ref 1.4–7.7)
Neutrophils Relative %: 56.7 % (ref 43.0–77.0)
Platelets: 285 10*3/uL (ref 150.0–400.0)
RBC: 3.95 Mil/uL (ref 3.87–5.11)
RDW: 13.5 % (ref 11.5–15.5)
WBC: 9.9 10*3/uL (ref 4.0–10.5)

## 2023-05-07 LAB — URINALYSIS, ROUTINE W REFLEX MICROSCOPIC
Bilirubin Urine: NEGATIVE
Hgb urine dipstick: NEGATIVE
Ketones, ur: NEGATIVE
Leukocytes,Ua: NEGATIVE
Nitrite: NEGATIVE
RBC / HPF: NONE SEEN (ref 0–?)
Specific Gravity, Urine: 1.01 (ref 1.000–1.030)
Total Protein, Urine: NEGATIVE
Urine Glucose: NEGATIVE
Urobilinogen, UA: 0.2 (ref 0.0–1.0)
pH: 6 (ref 5.0–8.0)

## 2023-05-07 LAB — LIPID PANEL
Cholesterol: 196 mg/dL (ref 0–200)
HDL: 70.8 mg/dL (ref >39.00)
LDL Cholesterol: 103 mg/dL — ABNORMAL HIGH (ref 0–99)
NonHDL: 125.64
Total CHOL/HDL Ratio: 3
Triglycerides: 112 mg/dL (ref 0.0–149.0)
VLDL: 22.4 mg/dL (ref 0.0–40.0)

## 2023-05-07 LAB — HEPATIC FUNCTION PANEL
ALT: 28 U/L (ref 0–35)
AST: 21 U/L (ref 0–37)
Albumin: 4.2 g/dL (ref 3.5–5.2)
Alkaline Phosphatase: 56 U/L (ref 39–117)
Bilirubin, Direct: 0 mg/dL (ref 0.0–0.3)
Total Bilirubin: 0.4 mg/dL (ref 0.2–1.2)
Total Protein: 7.1 g/dL (ref 6.0–8.3)

## 2023-05-07 LAB — BASIC METABOLIC PANEL
BUN: 26 mg/dL — ABNORMAL HIGH (ref 6–23)
CO2: 29 meq/L (ref 19–32)
Calcium: 9.6 mg/dL (ref 8.4–10.5)
Chloride: 100 meq/L (ref 96–112)
Creatinine, Ser: 1.36 mg/dL — ABNORMAL HIGH (ref 0.40–1.20)
GFR: 37.99 mL/min — ABNORMAL LOW (ref >60.00)
Glucose, Bld: 84 mg/dL (ref 70–99)
Potassium: 4 meq/L (ref 3.5–5.1)
Sodium: 138 meq/L (ref 135–145)

## 2023-05-07 LAB — VITAMIN B12: Vitamin B-12: 1413 pg/mL — ABNORMAL HIGH (ref 211–911)

## 2023-05-07 LAB — TSH: TSH: 5.18 u[IU]/mL (ref 0.35–5.50)

## 2023-05-07 LAB — HEMOGLOBIN A1C: Hgb A1c MFr Bld: 6.1 % (ref 4.6–6.5)

## 2023-05-11 ENCOUNTER — Encounter: Payer: Self-pay | Admitting: Family Medicine

## 2023-05-12 ENCOUNTER — Encounter: Payer: Self-pay | Admitting: Internal Medicine

## 2023-05-12 ENCOUNTER — Ambulatory Visit (INDEPENDENT_AMBULATORY_CARE_PROVIDER_SITE_OTHER): Payer: PPO | Admitting: Internal Medicine

## 2023-05-12 VITALS — BP 120/68 | HR 60 | Temp 99.0°F | Ht 60.0 in | Wt 171.0 lb

## 2023-05-12 DIAGNOSIS — Z0000 Encounter for general adult medical examination without abnormal findings: Secondary | ICD-10-CM

## 2023-05-12 DIAGNOSIS — R739 Hyperglycemia, unspecified: Secondary | ICD-10-CM

## 2023-05-12 DIAGNOSIS — N1831 Chronic kidney disease, stage 3a: Secondary | ICD-10-CM

## 2023-05-12 DIAGNOSIS — E7800 Pure hypercholesterolemia, unspecified: Secondary | ICD-10-CM

## 2023-05-12 DIAGNOSIS — I10 Essential (primary) hypertension: Secondary | ICD-10-CM | POA: Diagnosis not present

## 2023-05-12 DIAGNOSIS — Z0001 Encounter for general adult medical examination with abnormal findings: Secondary | ICD-10-CM

## 2023-05-12 MED ORDER — PROPRANOLOL HCL ER BEADS 80 MG PO CP24: 80.0000 mg | ORAL_CAPSULE | Freq: Every day | ORAL | 3 refills | Status: DC

## 2023-05-12 MED ORDER — ESTRADIOL 1 MG PO TABS: 1.0000 mg | ORAL_TABLET | Freq: Every day | ORAL | 3 refills | Status: DC

## 2023-05-12 MED ORDER — AMLODIPINE BESYLATE 5 MG PO TABS: 5.0000 mg | ORAL_TABLET | Freq: Every day | ORAL | 3 refills | Status: DC

## 2023-05-12 MED ORDER — OXYBUTYNIN CHLORIDE ER 10 MG PO TB24: 10.0000 mg | ORAL_TABLET | Freq: Every day | ORAL | 3 refills | Status: DC

## 2023-05-12 MED ORDER — SPIRONOLACTONE 25 MG PO TABS: 12.5000 mg | ORAL_TABLET | Freq: Once | ORAL | 3 refills | Status: DC

## 2023-05-12 MED ORDER — FUROSEMIDE 40 MG PO TABS: ORAL_TABLET | ORAL | 1 refills | Status: DC

## 2023-05-12 MED ORDER — ROSUVASTATIN CALCIUM 40 MG PO TABS: 40.0000 mg | ORAL_TABLET | Freq: Every day | ORAL | 3 refills | Status: DC

## 2023-05-12 MED ORDER — TELMISARTAN 80 MG PO TABS: 80.0000 mg | ORAL_TABLET | Freq: Every day | ORAL | 3 refills | Status: DC

## 2023-05-12 NOTE — Patient Instructions (Signed)
Ok to change the pravastatin to crestor 40 mg per day  Please continue all other medications as before, and refills have been done if requested.  Please have the pharmacy call with any other refills you may need.  Please continue your efforts at being more active, low cholesterol diet, and weight control.  You are otherwise up to date with prevention measures today.  Please keep your appointments with your specialists as you may have planned  You will be contacted regarding the referral for: Kidney doctor  Please make an Appointment to return in 6 months, or sooner if needed, also with Lab Appointment for testing done 3-5 days before at the FIRST FLOOR Lab (so this is for TWO appointments - please see the scheduling desk as you leave)

## 2023-05-12 NOTE — Telephone Encounter (Signed)
Parke Poisson, MD  Morrell Riddle, RN; Bernita Buffy, RN Yes, the crestor and rosuvastatin are the same medication.  What did your primary care doctor say about the BP today?  If she'd like she can increase her amlodipine to 10 mg daily and monitor her BP and symptoms, report back in 1-2 weeks.     Left message for pt to call back to discuss recommendations.

## 2023-05-12 NOTE — Progress Notes (Signed)
Patient ID: Deborah Schultz, female   DOB: 11-03-1945, 78 y.o.   MRN: [id number]         Chief Complaint:: wellness exam and htn [id number], htn, hyperglycemia       HPI:  Deborah Schultz is a 78 y.o. female here for wellness exam; declines covid booster, o/w up to date                        Also Pt denies chest pain, increased sob or doe, wheezing, orthopnea, PND, increased LE swelling, palpitations, dizziness or syncope.   Pt denies polydipsia, polyuria, or new focal neuro s/s.    Pt denies fever, wt loss, night sweats, loss of appetite, or other constitutional symptoms     Wt Readings from Last 3 Encounters:  05/12/23 171 lb (77.6 kg)  04/09/23 170 lb (77.1 kg)  03/22/23 169 lb (76.7 kg)   BP Readings from Last 3 Encounters:  05/12/23 120/68  04/09/23 (!) 142/76  03/22/23 (!) 158/98   Immunization History  Administered Date(s) Administered   Fluad Quad(high Dose 65+) 12/23/2018, 03/02/2020, 02/26/2021, 02/20/2022   Fluad Trivalent(High Dose 65+) 02/24/2023   Influenza Split 02/29/2012   Influenza Whole 01/19/2008   Influenza, High Dose Seasonal PF 02/02/2017, 12/16/2017   Influenza-Unspecified 02/20/2022   PFIZER(Purple Top)SARS-COV-2 Vaccination 05/12/2019, 06/02/2019, 01/18/2020, 02/10/2021   Pfizer(Comirnaty)Fall Seasonal Vaccine 12 years and older 01/29/2022   Pneumococcal Conjugate-13 10/22/2016   Pneumococcal Polysaccharide-23 10/26/2017   Pneumococcal-Unspecified 10/26/2017   Td 06/06/2008   Tdap 09/19/2018   Unspecified SARS-COV-2 Vaccination 05/12/2019, 06/02/2019, 01/18/2020, 02/10/2021, 01/29/2022   Zoster Recombinant(Shingrix) 01/18/2023, 03/15/2023   Zoster, Live 06/06/2008   Health Maintenance Due  Topic Date Due   Medicare Annual Wellness (AWV)  07/18/2016      Past Medical History:  Diagnosis Date   ALOPECIA 06/06/2008   Anemia    iron deficiency   ANEMIA-IRON DEFICIENCY 06/06/2008   BICEPS TENDINITIS, LEFT 05/06/2010   BRONCHITIS,  ACUTE 06/18/2008   CALLUSES, FEET, BILATERAL 06/06/2008   Chronic kidney disease    Chronic pruritus    CONSTIPATION 06/06/2008   Cough 06/18/2008   Degenerative disc disease, cervical    Diverticulitis    DIVERTICULITIS, HX OF 06/06/2008   ELBOW PAIN 05/26/2010   FATIGUE 06/18/2008   HLD (hyperlipidemia)    HTN (hypertension)    HYPERLIPIDEMIA 06/06/2008   OAB (overactive bladder)    OVERACTIVE BLADDER 06/06/2008   PERIPHERAL EDEMA 02/19/2009   Peripheral edema 02/26/2011   Right carpal tunnel syndrome    SINUSITIS- ACUTE-NOS 01/08/2009   Past Surgical History:  Procedure Laterality Date   ABDOMINAL HYSTERECTOMY     APPENDECTOMY     BREAST BIOPSY     BUNIONECTOMY     CATARACT EXTRACTION     bilateral 11/2009-no longer wears glasses   ECTOPIC PREGNANCY SURGERY     OOPHORECTOMY     right thumb surgery      reports that she has never smoked. She has never used smokeless tobacco. She reports current alcohol use. She reports that she does not use drugs. family history includes Cancer in her maternal aunt and maternal uncle; Diabetes in her brother; Hypertension in her mother; Ovarian cancer in her mother. Allergies  Allergen Reactions   Nystatin Rash   Sulfa Antibiotics Anaphylaxis and Other (See Comments)    Per pt: unknown reaction   Esomeprazole Magnesium     REACTION: Headache   Fexofenadine     REACTION: Hives  Sulfonamide Derivatives     Per pt: unknown reaction   Current Outpatient Medications on File Prior to Visit  Medication Sig Dispense Refill   aspirin 81 MG tablet Take 81 mg by mouth daily.       cholecalciferol (VITAMIN D3) 25 MCG (1000 UNIT) tablet Take 1,000 Units by mouth daily.     hydrocortisone 2.5 % cream Apply 1 Application topically 2 (two) times daily as needed.     Multiple Vitamin (MULTIVITAMIN) tablet Take 1 tablet by mouth daily.       triamcinolone cream (KENALOG) 0.1 % Apply 1 Application topically 2 (two) times daily. 30 g 2   No  current facility-administered medications on file prior to visit.        ROS:  All others reviewed and negative.  Objective        PE:  BP 120/68 (BP Location: Right Arm, Patient Position: Sitting, Cuff Size: Normal)   Pulse 60   Temp 99 F (37.2 C) (Oral)   Ht 5' (1.524 m)   Wt 171 lb (77.6 kg)   SpO2 98%   BMI 33.40 kg/m                 Constitutional: Pt appears in NAD               HENT: Head: NCAT.                Right Ear: External ear normal.                 Left Ear: External ear normal.                Eyes: . Pupils are equal, round, and reactive to light. Conjunctivae and EOM are normal               Nose: without d/c or deformity               Neck: Neck supple. Gross normal ROM               Cardiovascular: Normal rate and regular rhythm.                 Pulmonary/Chest: Effort normal and breath sounds without rales or wheezing.                Abd:  Soft, NT, ND, + BS, no organomegaly               Neurological: Pt is alert. At baseline orientation, motor grossly intact               Skin: Skin is warm. No rashes, no other new lesions, LE edema - none               Psychiatric: Pt behavior is normal without agitation   Micro: none  Cardiac tracings I have personally interpreted today:  none  Pertinent Radiological findings (summarize): none   Lab Results  Component Value Date   WBC 9.9 05/07/2023   HGB 12.4 05/07/2023   HCT 37.4 05/07/2023   PLT 285.0 05/07/2023   GLUCOSE 84 05/07/2023   CHOL 196 05/07/2023   TRIG 112.0 05/07/2023   HDL 70.80 05/07/2023   LDLCALC 103 (H) 05/07/2023   ALT 28 05/07/2023   AST 21 05/07/2023   NA 138 05/07/2023   K 4.0 05/07/2023   CL 100 05/07/2023   CREATININE 1.36 (H) 05/07/2023   BUN 26 (H) 05/07/2023   CO2 29  05/07/2023   TSH 5.18 05/07/2023   HGBA1C 6.1 05/07/2023   Assessment/Plan:  Deborah Schultz is a 78 y.o. Black or African American [2] female with  has a past medical history of ALOPECIA  (06/06/2008), Anemia, ANEMIA-IRON DEFICIENCY (06/06/2008), BICEPS TENDINITIS, LEFT (05/06/2010), BRONCHITIS, ACUTE (06/18/2008), CALLUSES, FEET, BILATERAL (06/06/2008), Chronic kidney disease, Chronic pruritus, CONSTIPATION (06/06/2008), Cough (06/18/2008), Degenerative disc disease, cervical, Diverticulitis, DIVERTICULITIS, HX OF (06/06/2008), ELBOW PAIN (05/26/2010), FATIGUE (06/18/2008), HLD (hyperlipidemia), HTN (hypertension), HYPERLIPIDEMIA (06/06/2008), OAB (overactive bladder), OVERACTIVE BLADDER (06/06/2008), PERIPHERAL EDEMA (02/19/2009), Peripheral edema (02/26/2011), Right carpal tunnel syndrome, and SINUSITIS- ACUTE-NOS (01/08/2009).  Encounter for well adult exam with abnormal findings Age and sex appropriate education and counseling updated with regular exercise and diet Referrals for preventative services - none needed Immunizations addressed - declines covid booster Smoking counseling  - none needed Evidence for depression or other mood disorder - none significant Most recent labs reviewed. I have personally reviewed and have noted: 1) the patient's medical and social history 2) The patient's current medications and supplements 3) The patient's height, weight, and BMI have been recorded in the chart   CKD (chronic kidney disease) stage 3, GFR 30-59 ml/min (HCC) Lab Results  Component Value Date   CREATININE 1.36 (H) 05/07/2023   Mild worsening, cont to avoid nephrotoxins, for renal referral   Essential hypertension BP Readings from Last 3 Encounters:  05/12/23 120/68  04/09/23 (!) 142/76  03/22/23 (!) 158/98   Stable, pt to continue medical treatment norvasc 5 every day, propranolol xl 80 every day, micardis 80 qd   Hyperglycemia Lab Results  Component Value Date   HGBA1C 6.1 05/07/2023   Stable, pt to continue current medical treatment  - diet, wt control   Hyperlipidemia Lab Results  Component Value Date   LDLCALC 103 (H) 05/07/2023   uncontrolled, pt  for increased crestor 40 mg qd  Followup: Return in about 6 months (around 11/09/2023).  Oliver Barre, MD 05/15/2023 9:44 PM [facility]

## 2023-05-13 ENCOUNTER — Telehealth: Payer: Self-pay | Admitting: Internal Medicine

## 2023-05-13 NOTE — Telephone Encounter (Signed)
Patient is returning call and is requesting call back.  

## 2023-05-13 NOTE — Telephone Encounter (Signed)
Called and spoke to patient. Verified name and DOB. Below message relayed to patient. Patient stated she would not increase her Amlodipine to 10 mg at this time. She reports that her PCP advised her to keep monitoring her BP and started her on Crestor.

## 2023-05-15 ENCOUNTER — Encounter: Payer: Self-pay | Admitting: Internal Medicine

## 2023-05-15 NOTE — Assessment & Plan Note (Signed)
BP Readings from Last 3 Encounters:  05/12/23 120/68  04/09/23 (!) 142/76  03/22/23 (!) 158/98   Stable, pt to continue medical treatment norvasc 5 every day, propranolol xl 80 every day, micardis 80 qd

## 2023-05-15 NOTE — Assessment & Plan Note (Signed)

## 2023-05-15 NOTE — Assessment & Plan Note (Signed)
Lab Results  Component Value Date   LDLCALC 103 (H) 05/07/2023   uncontrolled, pt for increased crestor 40 mg qd

## 2023-05-15 NOTE — Assessment & Plan Note (Signed)
Lab Results  Component Value Date   CREATININE 1.36 (H) 05/07/2023   Mild worsening, cont to avoid nephrotoxins, for renal referral

## 2023-05-15 NOTE — Assessment & Plan Note (Signed)
Lab Results  Component Value Date   HGBA1C 6.1 05/07/2023   Stable, pt to continue current medical treatment  - diet, wt control

## 2023-05-21 ENCOUNTER — Encounter: Payer: Self-pay | Admitting: Internal Medicine

## 2023-05-21 NOTE — Telephone Encounter (Signed)
Ok to let the patient know  - I will forward her concerns to the [facility]

## 2023-06-02 ENCOUNTER — Telehealth: Payer: Self-pay | Admitting: Internal Medicine

## 2023-06-02 NOTE — Telephone Encounter (Signed)
 Copied from CRM [id number]. Topic: Referral - Question >> Jun 02, 2023 10:45 AM Kathryne Eriksson wrote: Reason for CRM: Fax Error >> Jun 02, 2023 10:48 AM Kathryne Eriksson wrote: [facility] called on behalf of a fax they received for Deborah Schultz, states they believe they received it in error as it looks at though it was meant to go to [facility] and just wanted to make sure it got to the correct office.

## 2023-06-02 NOTE — Telephone Encounter (Signed)
[facility] called to let us know they received the referral that was supposed to be sent to [facility]. Please re-fax to the correct office.

## 2023-06-14 NOTE — Telephone Encounter (Signed)
 Called and spoke to pt.  She states she has not made any changes to her medications.  She does not check her BP on a regular basis, but on Sunday it was 110/66.  She has felt fine, no symptoms to report.  The other thing she was worried about was she has been a bit SOB when walking at incline, but states she does not walk in that position any longer, and the SOB has gone away for the most part.    She had seen her PCP, who suggested she return to [facility] office if she was concerned about BP/symptoms.  Pt states she is on 5 different meds and at the moment, she does not wish to changed anything.  She understands that she can call/message [place] if needed to be seen sooner than 10/15/2023; we can schedule her to see APP if needed.

## 2023-06-29 ENCOUNTER — Encounter: Payer: Self-pay | Admitting: Dermatology

## 2023-06-29 ENCOUNTER — Ambulatory Visit: Payer: PPO | Admitting: Dermatology

## 2023-06-29 VITALS — BP 115/71

## 2023-06-29 DIAGNOSIS — D1801 Hemangioma of skin and subcutaneous tissue: Secondary | ICD-10-CM

## 2023-06-29 DIAGNOSIS — L309 Dermatitis, unspecified: Secondary | ICD-10-CM

## 2023-06-29 DIAGNOSIS — L816 Other disorders of diminished melanin formation: Secondary | ICD-10-CM | POA: Diagnosis not present

## 2023-06-29 DIAGNOSIS — L818 Other specified disorders of pigmentation: Secondary | ICD-10-CM

## 2023-06-29 NOTE — Progress Notes (Signed)
   Follow-Up Visit   Subjective  Deborah Schultz is a 78 y.o. female who presents for the following: Eczema  Patient present today for follow up visit. Patient was last evaluated on 03/01/23. At this visit patient was prescribed [facility] for flares. Patient reports sxs are better but still having some itching. Patient denies medication changes.  The following portions of the chart were reviewed this encounter and updated as appropriate: medications, allergies, medical history  Review of Systems:  No other skin or systemic complaints except as noted in HPI or Assessment and Plan.  Objective  Well appearing patient in no apparent distress; mood and affect are within normal limits.   A focused examination was performed of the following areas: legs  Relevant exam findings are noted in the Assessment and Plan.     Assessment & Plan   1. Eczema - Assessment: Patient reports intermittent flares of eczema on various body parts, including the leg and arm. The condition responds well to topical cream treatment, with relief lasting up to 2 weeks before recurrence. Significant break in symptoms on the leg between November and March. Eczema appears well-managed with current treatment approach. Patient declines more aggressive systemic therapy. - Plan:    Continue using prescribed topical cream as needed for flares    Increase use of CeraVe anti-itch lotion with Pramoxine to daily application    Try Aveeno colloidal oatmeal bath packets for additional symptom relief    Samples of Excedrin eczema therapy lotion provided    Follow up in 6 months  2. Cherry angiomas - Assessment: Patient presented with concern about new red rash on belly. Upon examination, identified as cherry angiomas, benign collections of superficial blood vessels. Not itchy or dangerous, common occurrence with aging. 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 Benign, age-related areas of hypopigmentation caused by decreased melanocyte function. Not associated with vitiligo and not expected to spread extensively.  - Plan:    Reassurance provided about benign nature of the condition    No treatment required    Return in about 6 months (around 12/30/2023) for eczema.    Documentation: I have reviewed the above documentation for accuracy and completeness, and I agree with the above.   I, Shirron Marcha Solders, CMA, am acting as scribe for [employer], DO.   Langston Reusing, DO

## 2023-06-29 NOTE — Patient Instructions (Addendum)
 Hello Deborah Schultz,  Thank you for visiting [place] today. We appreciate your dedication to managing your eczema and taking proactive steps towards enhancing your skin health.   Below is a summary of the essential instructions and recommendations from today's consultation:  Eczema Management: Continue with the prescribed eczema cream for flare-ups on your leg and arm.   Application: Apply for up to 2 weeks during flare-ups.  Daily Skincare: Incorporate CeraVe anti-itch lotion into your daily routine to help prevent itching.  Additional Treatments:   Oatmeal Baths: Consider using Aveeno packets for oatmeal baths to soothe and hydrate the skin.   Eucerin Eczema Therapy Lotion: Try this lotion; samples have been provided for your convenience.  Observations:   The red spots  on your belly has been identified as cherry angiomas, which are harmless.   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  Follow-Up: We will schedule a follow-up appointment in 6 months to monitor your condition.  Please ensure to apply the CeraVe lotion every day. Should you need any refills or have further questions, do not hesitate to reach out.  Wishing you a healthy and comfortable season ahead!  Best regards,  Dr. [facility]      Important Information  Due to recent changes in healthcare laws, you may see results of your pathology and/or laboratory studies on MyChart before the doctors have had a chance to review them. We understand that in some cases there may be results that are confusing or concerning to you. Please understand that not all results are received at the same time and often the doctors may need to interpret multiple results in order to provide you with the best plan of care or course of treatment. Therefore, we ask that you please give [place] 2 business days to thoroughly review all your results before contacting the  office for clarification. Should we see a critical lab result, you will be contacted sooner.   If You Need Anything After Your Visit  If you have any questions or concerns for your doctor, please call our main line at [phone] If no one answers, please leave a voicemail as directed and we will return your call as soon as possible. Messages left after 4 pm will be answered the following business day.   You may also send [place] a message via MyChart. We typically respond to MyChart messages within 1-2 business days.  For prescription refills, please ask your pharmacy to contact our office. Our fax number is [phone].  If you have an urgent issue when the clinic is closed that cannot wait until the next business day, you can page your doctor at the number below.    Please note that while we do our best to be available for urgent issues outside of office hours, we are not available 24/7.   If you have an urgent issue and are unable to reach [place], you may choose to seek medical care at your doctor's office, retail clinic, urgent care center, or emergency room.  If you have a medical emergency, please immediately call 911 or go to the emergency department. In the event of inclement weather, please call our main line at [phone] for an update on the status of any delays or closures.  Dermatology Medication Tips: Please keep the boxes that topical medications come in in order to help keep track of the instructions about where and how to use these. Pharmacies typically print the  medication instructions only on the boxes and not directly on the medication tubes.   If your medication is too expensive, please contact our office at [phone] or send [place] a message through MyChart.   We are unable to tell what your co-pay for medications will be in advance as this is different depending on your insurance coverage. However, we may be able to find a substitute medication at lower cost or fill out  paperwork to get insurance to cover a needed medication.   If a prior authorization is required to get your medication covered by your insurance company, please allow [place] 1-2 business days to complete this process.  Drug prices often vary depending on where the prescription is filled and some pharmacies may offer cheaper prices.  The website www.goodrx.com contains coupons for medications through different pharmacies. The prices here do not account for what the cost may be with help from insurance (it may be cheaper with your insurance), but the website can give you the price if you did not use any insurance.  - You can print the associated coupon and take it with your prescription to the pharmacy.  - You may also stop by our office during regular business hours and pick up a GoodRx coupon card.  - If you need your prescription sent electronically to a different pharmacy, notify our office through [facility] or by phone at [phone]

## 2023-07-12 ENCOUNTER — Other Ambulatory Visit: Payer: Self-pay

## 2023-07-12 ENCOUNTER — Ambulatory Visit (INDEPENDENT_AMBULATORY_CARE_PROVIDER_SITE_OTHER)

## 2023-07-12 ENCOUNTER — Ambulatory Visit: Admitting: Family Medicine

## 2023-07-12 VITALS — BP 182/92 | HR 59 | Ht 60.0 in

## 2023-07-12 DIAGNOSIS — M25511 Pain in right shoulder: Secondary | ICD-10-CM

## 2023-07-12 DIAGNOSIS — G8929 Other chronic pain: Secondary | ICD-10-CM

## 2023-07-12 DIAGNOSIS — M25512 Pain in left shoulder: Secondary | ICD-10-CM

## 2023-07-12 NOTE — Progress Notes (Signed)
 Rubin Payor, PhD, LAT, ATC acting as a scribe for Clementeen Graham, MD.  Deborah Schultz is a 78 y.o. female who presents to [facility] at [facility] today for bilat shoulder pain. Pt was previously seen by Dr. Denyse Amass in 2024 for R hip, shoulder, and R knee pain. Last R subacromial steroid injection 03/22/23.  Today, pt c/o bilat shoulder pain x 3-wks, R>L. Pain is disturbing her sleep. Pt locates pain to the anterior aspect of her R shoulder  Radiates: yes- intermittent in to R upper arm Aggravates: taking off jacket, bathing, IR Treatments tried: creams, ice, Tylenol  Dx imaging: 03/22/23 R shoulder XR  Pertinent review of systems: No fevers or chills  Relevant historical information: Renal artery stenosis and hypertension.   Exam:  BP (!) 182/92   Pulse (!) 59   Ht 5' (1.524 m)   SpO2 99%   BMI 33.40 kg/m  General: Well Developed, well nourished, and in no acute distress.   MSK: Right shoulder normal-appearing Decreased shoulder motion pain with abduction.  Range of motion limited to about 100 degrees abduction.  Functional internal rotation range of motion is intact to lumbar spine.  External rotation is intact. Strength reduced abduction 4/5. Positive Hawkins and Neer's test. Positive empty can test. Negative Yergason's and speeds test.  Left shoulder: Normal-appearing Decreased range of motion and pain with abduction.  Range of motion limited to about 100 degrees of abduction.  Functional internal rotation intact to lumbar spine external rotation is intact. Strength intact 5/5. Positive empty can test and Hawkins and Neer's test. Negative Yergason's and speeds test.  Pulses capillary fill and sensation are intact.  Grip strength is intact.    Lab and Radiology Results  Procedure: Real-time Ultrasound Guided Injection of right shoulder subacromial bursa Device: Philips Affiniti 50G/GE Logiq Images permanently stored and available for  review in PACS Verbal informed consent obtained.  Discussed risks and benefits of procedure. Warned about infection, bleeding, hyperglycemia damage to structures among others. Patient expresses understanding and agreement Time-out conducted.   Noted no overlying erythema, induration, or other signs of local infection.   Skin prepped in a sterile fashion.   Local anesthesia: Topical Ethyl chloride.   With sterile technique and under real time ultrasound guidance: 40 mg of Kenalog and 2 mL of Marcaine injected into subacromial bursa. Fluid seen entering the bursa.   Completed without difficulty   Pain immediately resolved suggesting accurate placement of the medication.   Advised to call if fevers/chills, erythema, induration, drainage, or persistent bleeding.   Images permanently stored and available for review in the ultrasound unit.  Impression: Technically successful ultrasound guided injection.    Procedure: Real-time Ultrasound Guided Injection of left shoulder subacromial bursa Device: Philips Affiniti 50G/GE Logiq Images permanently stored and available for review in PACS Verbal informed consent obtained.  Discussed risks and benefits of procedure. Warned about infection, bleeding, hyperglycemia damage to structures among others. Patient expresses understanding and agreement Time-out conducted.   Noted no overlying erythema, induration, or other signs of local infection.   Skin prepped in a sterile fashion.   Local anesthesia: Topical Ethyl chloride.   With sterile technique and under real time ultrasound guidance: 40 mg of Kenalog and 2 mL of Marcaine injected into subacromial bursa. Fluid seen entering the bursa.   Completed without difficulty   Pain immediately resolved suggesting accurate placement of the medication.   Advised to call if fevers/chills, erythema, induration, drainage, or persistent bleeding.  Images permanently stored and available for review in the ultrasound  unit.  Impression: Technically successful ultrasound guided injection.    X-ray images left shoulder obtained today personally and independently interpreted No acute fractures no severe degenerative changes. Await formal radiology review     Assessment and Plan: 78 y.o. female with chronic bilateral shoulder pain.  Right shoulder is recurrent and an exacerbation of a chronic problem.  She had a subacromial injection in the right shoulder in early December which lasted about 3 months.  She does have a bit of weakness as well on that right side.  Plan for repeat right-sided subacromial injection and new left-sided subacromial injection.  Additionally will refer to physical therapy.  Recheck in about 8 weeks.  Return sooner if needed. If not better especially on that right side consider MRI.  PDMP not reviewed this encounter. Orders Placed This Encounter  Procedures   [place] LIMITED JOINT SPACE STRUCTURES UP BILAT(NO LINKED CHARGES)    Reason for Exam (SYMPTOM  OR DIAGNOSIS REQUIRED):   bilateral shoulder pain    Preferred imaging location?:   [facility] Shoulder Left    Standing Status:   Future    Number of Occurrences:   1    Expiration Date:   08/12/2023    Reason for Exam (SYMPTOM  OR DIAGNOSIS REQUIRED):   bilateral shoulder pain    Preferred imaging location?:   [facility]   Ambulatory referral to Physical Therapy    Referral Priority:   Routine    Referral Type:   Physical Medicine    Referral Reason:   Specialty Services Required    Requested Specialty:   Physical Therapy    Number of Visits Requested:   1   No orders of the defined types were placed in this encounter.    Discussed warning signs or symptoms. Please see discharge instructions. Patient expresses understanding.   The above documentation has been reviewed and is accurate and complete Clementeen Graham, M.D.

## 2023-07-12 NOTE — Patient Instructions (Addendum)
 Thank you for coming in today.   You received an injection today. Seek immediate medical attention if the joint becomes red, extremely painful, or is oozing fluid.   Please get an Xray today before you leave   I've referred you to Physical Therapy.  Let us know if you don't hear from them in one week.   Check back in 2 months

## 2023-07-23 ENCOUNTER — Encounter: Payer: Self-pay | Admitting: Family Medicine

## 2023-07-23 NOTE — Progress Notes (Signed)
Left shoulder x-ray looks normal to radiology

## 2023-07-26 ENCOUNTER — Ambulatory Visit: Admitting: Physical Therapy

## 2023-07-26 DIAGNOSIS — M6281 Muscle weakness (generalized): Secondary | ICD-10-CM | POA: Diagnosis not present

## 2023-07-26 DIAGNOSIS — R293 Abnormal posture: Secondary | ICD-10-CM | POA: Diagnosis not present

## 2023-07-26 DIAGNOSIS — M25512 Pain in left shoulder: Secondary | ICD-10-CM

## 2023-07-26 DIAGNOSIS — M25511 Pain in right shoulder: Secondary | ICD-10-CM | POA: Diagnosis not present

## 2023-07-26 DIAGNOSIS — G8929 Other chronic pain: Secondary | ICD-10-CM

## 2023-07-26 NOTE — Therapy (Signed)
 OUTPATIENT PHYSICAL THERAPY EVALUATION   Patient Name: Deborah Schultz MRN: [id number] DOB:06/25/1945, 78 y.o., female Today's Date: 07/26/2023  END OF SESSION:  PT End of Session - 07/26/23 1205     Visit Number 1    Number of Visits 6    Date for PT Re-Evaluation 09/06/23    Authorization Type [employer] $10 copay    Progress Note Due on Visit 10    PT Start Time 0930    PT Stop Time 1007    PT Time Calculation (min) 37 min    Activity Tolerance Patient tolerated treatment well    Behavior During Therapy WFL for tasks assessed/performed             Past Medical History:  Diagnosis Date   ALOPECIA 06/06/2008   Anemia    iron deficiency   ANEMIA-IRON DEFICIENCY 06/06/2008   BICEPS TENDINITIS, LEFT 05/06/2010   BRONCHITIS, ACUTE 06/18/2008   CALLUSES, FEET, BILATERAL 06/06/2008   Chronic kidney disease    Chronic pruritus    CONSTIPATION 06/06/2008   Cough 06/18/2008   Degenerative disc disease, cervical    Diverticulitis    DIVERTICULITIS, HX OF 06/06/2008   ELBOW PAIN 05/26/2010   FATIGUE 06/18/2008   HLD (hyperlipidemia)    HTN (hypertension)    HYPERLIPIDEMIA 06/06/2008   OAB (overactive bladder)    OVERACTIVE BLADDER 06/06/2008   PERIPHERAL EDEMA 02/19/2009   Peripheral edema 02/26/2011   Right carpal tunnel syndrome    SINUSITIS- ACUTE-NOS 01/08/2009   Past Surgical History:  Procedure Laterality Date   ABDOMINAL HYSTERECTOMY     APPENDECTOMY     BREAST BIOPSY     BUNIONECTOMY     CATARACT EXTRACTION     bilateral 11/2009-no longer wears glasses   ECTOPIC PREGNANCY SURGERY     OOPHORECTOMY     right thumb surgery     Patient Active Problem List   Diagnosis Date Noted   COVID-19 virus infection 10/22/2022   Right otitis media 10/20/2022   Palpitations 07/10/2022   DOE (dyspnea on exertion) 07/10/2022   Osteopenia of right hip 05/25/2022   Chronic pain of right knee 05/25/2022   Hyperglycemia 05/18/2022   Arthritis  05/14/2022   Estrogen deficiency 05/14/2022   Skin lesion of chest wall 10/05/2021   Dysuria 10/03/2021   Lytic lesion of bone on x-ray 02/17/2021   Pain and swelling of left lower leg 12/14/2020   CKD (chronic kidney disease) stage 3, GFR 30-59 ml/min (HCC) 12/14/2020   Post-traumatic headache 12/02/2020   Pain in left hand 12/02/2020   Bilateral leg pain 12/02/2020   Arthritis of carpometacarpal [facility]) joint of right thumb 08/17/2017   Sore throat 03/19/2017   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   Left shoulder pain 07/08/2016   Left elbow pain 07/08/2016   Bunion, right 08/14/2015   Low back pain 11/11/2014   Eustachian tube dysfunction 04/26/2013   Upper respiratory infection 10/03/2012   Right renal artery stenosis (HCC) 02/29/2012   Peripheral edema 02/26/2011   Encounter for well adult exam with abnormal findings 02/20/2011   Pruritic disorder 05/26/2010   ELBOW PAIN 05/26/2010   BICEPS TENDINITIS, LEFT 05/06/2010   RASH-NONVESICULAR 01/21/2009   FATIGUE 06/18/2008   Cough 06/18/2008   Hyperlipidemia 06/06/2008   ANEMIA-IRON DEFICIENCY 06/06/2008   Essential hypertension 06/06/2008   CONSTIPATION 06/06/2008   OVERACTIVE BLADDER 06/06/2008   CALLUSES, FEET, BILATERAL 06/06/2008   ALOPECIA 06/06/2008   DIVERTICULITIS, HX OF 06/06/2008    PCP: Oliver Barre  Lacretia Nicks, MD  REFERRING PROVIDER: Rodolph Bong, MD  REFERRING DIAG: M25.511,G89.29,M25.512 (ICD-10-CM) - Chronic pain of both shoulders  Rationale for Evaluation and Treatment: Rehabilitation  THERAPY DIAG:  Chronic pain of both shoulders - Plan: PT plan of care cert/re-cert  Abnormal posture - Plan: PT plan of care cert/re-cert  Muscle weakness (generalized) - Plan: PT plan of care cert/re-cert  ONSET DATE: 1 year ago   SUBJECTIVE:                                                                                                                                                                                            SUBJECTIVE STATEMENT: Pt with bil shoulder pain for about a year without know injury.  She had a Rt shoulder injection in Dec 2024, and recently bil shoulder injections.  Pt reports no shoulder pain today or any since the injection   PERTINENT HISTORY:  Alopecia, anemia, CKD, DDD, HTN, HLD  PAIN:  Are you having pain? Yes: NPRS scale: 0 currently, no pain since injection (prior to injection 5-10/10) Pain location: bil shoulders Pain description: aching, sharp Aggravating factors: unsure, had trouble sleeping Relieving factors: injection  PRECAUTIONS:  None  RED FLAGS: None   WEIGHT BEARING RESTRICTIONS:  No  FALLS:  Has patient fallen in last 6 months? No  LIVING ENVIRONMENT: Lives with: lives with their spouse Lives in: House/apartment  OCCUPATION:  Retired from [employer]  PLOF:  Independent and Leisure: casino, no regular exercise (does have a treadmill and stationary bike at home)  PATIENT GOALS:  Stay out of pain   OBJECTIVE:  Note: Objective measures were completed at Evaluation unless otherwise noted.  DIAGNOSTIC FINDINGS:  X-rays: WNL   COGNITIVE STATUS: Within functional limits for tasks assessed   SENSATION: WFL  POSTURE:  rounded shoulders and forward head  HAND DOMINANCE:  Right  GAIT: 07/26/23 Comments: independent  UPPER EXTREMITY ROM: 07/26/23: bil shoulder WFL without pain   UPPER EXTREMITY MMT:  MMT Right eval Left eval  Shoulder flexion 3/5 3/5  Shoulder abduction 3/5 3/5  Shoulder internal rotation 4/5 4/5  Shoulder external rotation 5/5 4/5   (Blank rows = not tested)    SPECIAL TESTS:  07/26/23 Upper Extremity Rotator cuff assessment: Empty can test: positive  and Full can test: negative    TREATMENT:  DATE:  07/26/23 TherEx See HEP - demonstrated with trial reps performed PRN,  mod cues for comprehension    Self Care Educated on clinical findings and POC     PATIENT EDUCATION:  Education details: HEP Person educated: Patient Education method: Programmer, multimedia, Facilities manager, and Handouts Education comprehension: verbalized understanding, returned demonstration, and needs further education  HOME EXERCISE PROGRAM: Access Code: [email or web address] URL: [email or web address] Date: 07/26/2023 Prepared by: Moshe Cipro  Exercises - Standing Shoulder Row with Anchored Resistance  - 1-2 x daily - 7 x weekly - 3 sets - 10 reps - Shoulder External Rotation and Scapular Retraction with Resistance  - 1-2 x daily - 7 x weekly - 3 sets - 10 reps - 5 sec hold - Standing Shoulder Flexion to 90 Degrees with Dumbbells  - 1-2 x daily - 7 x weekly - 3 sets - 10 reps - Shoulder Abduction with Dumbbells - Thumbs Up  - 1-2 x daily - 7 x weekly - 3 sets - 10 reps - Scaption with Dumbbells  - 1-2 x daily - 7 x weekly - 3 sets - 10 reps   ASSESSMENT:  CLINICAL IMPRESSION: Patient is a 78 y.o. female who was seen today for physical therapy evaluation and treatment for bil shoulder pain. She demonstrates mild strength deficits and postural abnormalities, as well as recent pain in bil shoulders affecting functional mobility.  She will benefit from PT to address deficits listed.     OBJECTIVE IMPAIRMENTS: decreased strength, impaired UE functional use, postural dysfunction, and pain.   ACTIVITY LIMITATIONS: carrying, lifting, bending, sleeping, bed mobility, reach over head, and hygiene/grooming  PARTICIPATION LIMITATIONS: meal prep, cleaning, laundry, driving, shopping, community activity, and yard work  PERSONAL FACTORS: 3+ comorbidities: Alopecia, anemia, CKD, DDD, HTN, HLD  are also affecting patient's functional outcome.   REHAB POTENTIAL: Good  CLINICAL DECISION MAKING: Stable/uncomplicated  EVALUATION COMPLEXITY: Low   GOALS: Goals reviewed with patient?  Yes  SHORT TERM GOALS: Target date: 08/16/2023  Independent with initial HEP Goal status: INITIAL   LONG TERM GOALS: Target date: 09/06/2023  Independent with final HEP Goal status: INITIAL  2.  Bil shoulder strength improved to 4/5 for improved function and mobility Goal status: INITIAL  3.  Report pain < 2/10 with overhead activities for improved function Goal status: INIITAL    PLAN:  PT FREQUENCY: 1x/week (anticipate we will see every other week, but will see up to 1x/wk if needed)  PT DURATION: 6 weeks  PLANNED INTERVENTIONS: 97164- PT Re-evaluation, 97110-Therapeutic exercises, 97530- Therapeutic activity, 97112- Neuromuscular re-education, 97535- Self Care, 30865- Manual therapy, [id number]- Aquatic Therapy, [id number]- Electrical stimulation (unattended), 97035- Ultrasound, 62952- Ionotophoresis 4mg /ml Dexamethasone, Patient/Family education, Taping, Dry Needling, Joint mobilization, Cryotherapy, and Moist heat.  PLAN FOR NEXT SESSION: Review HEP, progress strengthening as able   NEXT MD VISIT: 09/10/23   Clarita Crane, PT, DPT 07/26/23 12:07 PM

## 2023-08-16 ENCOUNTER — Encounter: Admitting: Physical Therapy

## 2023-08-27 ENCOUNTER — Ambulatory Visit: Admitting: Physical Therapy

## 2023-08-27 ENCOUNTER — Encounter: Payer: Self-pay | Admitting: Physical Therapy

## 2023-08-27 DIAGNOSIS — M25512 Pain in left shoulder: Secondary | ICD-10-CM

## 2023-08-27 DIAGNOSIS — R293 Abnormal posture: Secondary | ICD-10-CM

## 2023-08-27 DIAGNOSIS — M6281 Muscle weakness (generalized): Secondary | ICD-10-CM

## 2023-08-27 DIAGNOSIS — G8929 Other chronic pain: Secondary | ICD-10-CM

## 2023-08-27 DIAGNOSIS — M25511 Pain in right shoulder: Secondary | ICD-10-CM | POA: Diagnosis not present

## 2023-08-27 NOTE — Therapy (Signed)
 OUTPATIENT PHYSICAL THERAPY TREATMENT DISCHARGE SUMMARY   Patient Name: Deborah Schultz MRN: [id number] DOB:[birth year], 78 y.o., female Today's Date: 08/27/2023  END OF SESSION:  PT End of Session - 08/27/23 0838     Visit Number 2    Number of Visits 6    Date for PT Re-Evaluation 09/06/23    Authorization Type [employer] $10 copay    Progress Note Due on Visit 10    PT Start Time 0840    PT Stop Time 0904    PT Time Calculation (min) 24 min    Activity Tolerance Patient tolerated treatment well    Behavior During Therapy [facility] for tasks assessed/performed              Past Medical History:  Diagnosis Date   ALOPECIA 06/06/2008   Anemia    iron deficiency   ANEMIA-IRON DEFICIENCY 06/06/2008   BICEPS TENDINITIS, LEFT 05/06/2010   BRONCHITIS, ACUTE 06/18/2008   CALLUSES, FEET, BILATERAL 06/06/2008   Chronic kidney disease    Chronic pruritus    CONSTIPATION 06/06/2008   Cough 06/18/2008   Degenerative disc disease, cervical    Diverticulitis    DIVERTICULITIS, HX OF 06/06/2008   ELBOW PAIN 05/26/2010   FATIGUE 06/18/2008   HLD (hyperlipidemia)    HTN (hypertension)    HYPERLIPIDEMIA 06/06/2008   OAB (overactive bladder)    OVERACTIVE BLADDER 06/06/2008   PERIPHERAL EDEMA 02/19/2009   Peripheral edema 02/26/2011   Right carpal tunnel syndrome    SINUSITIS- ACUTE-NOS 01/08/2009   Past Surgical History:  Procedure Laterality Date   ABDOMINAL HYSTERECTOMY     APPENDECTOMY     BREAST BIOPSY     BUNIONECTOMY     CATARACT EXTRACTION     bilateral 11/2009-no longer wears glasses   ECTOPIC PREGNANCY SURGERY     OOPHORECTOMY     right thumb surgery     Patient Active Problem List   Diagnosis Date Noted   COVID-19 virus infection 10/22/2022   Right otitis media 10/20/2022   Palpitations 07/10/2022   DOE (dyspnea on exertion) 07/10/2022   Osteopenia of right hip 05/25/2022   Chronic pain of right knee 05/25/2022   Hyperglycemia 05/18/2022    Arthritis 05/14/2022   Estrogen deficiency 05/14/2022   Skin lesion of chest wall 10/05/2021   Dysuria 10/03/2021   Lytic lesion of bone on x-ray 02/17/2021   Pain and swelling of left lower leg 12/14/2020   CKD (chronic kidney disease) stage 3, GFR 30-59 ml/min (HCC) 12/14/2020   Post-traumatic headache 12/02/2020   Pain in left hand 12/02/2020   Bilateral leg pain 12/02/2020   Arthritis of carpometacarpal [facility]) joint of right thumb 08/17/2017   Sore throat 03/19/2017   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   Left shoulder pain 07/08/2016   Left elbow pain 07/08/2016   Bunion, right 08/14/2015   Low back pain 11/11/2014   Eustachian tube dysfunction 04/26/2013   Upper respiratory infection 10/03/2012   Right renal artery stenosis (HCC) 02/29/2012   Peripheral edema 02/26/2011   Encounter for well adult exam with abnormal findings 02/20/2011   Pruritic disorder 05/26/2010   ELBOW PAIN 05/26/2010   BICEPS TENDINITIS, LEFT 05/06/2010   RASH-NONVESICULAR 01/21/2009   FATIGUE 06/18/2008   Cough 06/18/2008   Hyperlipidemia 06/06/2008   ANEMIA-IRON DEFICIENCY 06/06/2008   Essential hypertension 06/06/2008   CONSTIPATION 06/06/2008   OVERACTIVE BLADDER 06/06/2008   CALLUSES, FEET, BILATERAL 06/06/2008   ALOPECIA 06/06/2008   DIVERTICULITIS, HX OF 06/06/2008  PCP: Roslyn Coombe, MD  REFERRING PROVIDER: Syliva Even, MD  REFERRING DIAG: M25.511,G89.29,M25.512 (ICD-10-CM) - Chronic pain of both shoulders  Rationale for Evaluation and Treatment: Rehabilitation  THERAPY DIAG:  Chronic pain of both shoulders  Abnormal posture  Muscle weakness (generalized)  ONSET DATE: 1 year ago   SUBJECTIVE:                                                                                                                                                                                           SUBJECTIVE STATEMENT: Shoulder is still doing well; no pain.  Reports she's having no  difficulty with ADLs and other tasks.     PERTINENT HISTORY:  Alopecia, anemia, CKD, DDD, HTN, HLD  PAIN:  Are you having pain? Yes: NPRS scale: 0 currently, no pain since injection (prior to injection 5-10/10) Pain location: bil shoulders Pain description: aching, sharp Aggravating factors: unsure, had trouble sleeping Relieving factors: injection  PRECAUTIONS:  None  RED FLAGS: None   WEIGHT BEARING RESTRICTIONS:  No  FALLS:  Has patient fallen in last 6 months? No  LIVING ENVIRONMENT: Lives with: lives with their spouse Lives in: House/apartment  OCCUPATION:  Retired from [employer]  PLOF:  Independent and Leisure: casino, no regular exercise (does have a treadmill and stationary bike at home)  PATIENT GOALS:  Stay out of pain   OBJECTIVE:  Note: Objective measures were completed at Evaluation unless otherwise noted.  DIAGNOSTIC FINDINGS:  X-rays: WNL   COGNITIVE STATUS: Within functional limits for tasks assessed   SENSATION: WFL  POSTURE:  rounded shoulders and forward head  HAND DOMINANCE:  Right  GAIT: 07/26/23 Comments: independent  UPPER EXTREMITY ROM: 07/26/23: bil shoulder WFL without pain   UPPER EXTREMITY MMT:  MMT Right eval Left eval Rt/Lt 08/27/23  Shoulder flexion 3/5 3/5 4/4  Shoulder abduction 3/5 3/5 4/4  Shoulder internal rotation 4/5 4/5   Shoulder external rotation 5/5 4/5    (Blank rows = not tested)    SPECIAL TESTS:  07/26/23 Upper Extremity Rotator cuff assessment: Empty can test: positive  and Full can test: negative    TREATMENT:  DATE:  08/27/23 TherEx Discussed HEP with pt and she has no questions at this time.  Also discussed transition to global exercise program and continued focus on strengthening.  Pt has several app based programs she is familiar with and discussed other  options as well.  Reviewed expectations for strengthening and when to increase/decrease resistance based on symptoms MMT - see above for details  07/26/23 TherEx See HEP - demonstrated with trial reps performed PRN, mod cues for comprehension    Self Care Educated on clinical findings and POC     PATIENT EDUCATION:  Education details: HEP Person educated: Patient Education method: Programmer, multimedia, Facilities manager, and Handouts Education comprehension: verbalized understanding, returned demonstration, and needs further education  HOME EXERCISE PROGRAM: Access Code: [email or web address] URL: [email or web address] Date: 07/26/2023 Prepared by: Casimer Clear  Exercises - Standing Shoulder Row with Anchored Resistance  - 1-2 x daily - 7 x weekly - 3 sets - 10 reps - Shoulder External Rotation and Scapular Retraction with Resistance  - 1-2 x daily - 7 x weekly - 3 sets - 10 reps - 5 sec hold - Standing Shoulder Flexion to 90 Degrees with Dumbbells  - 1-2 x daily - 7 x weekly - 3 sets - 10 reps - Shoulder Abduction with Dumbbells - Thumbs Up  - 1-2 x daily - 7 x weekly - 3 sets - 10 reps - Scaption with Dumbbells  - 1-2 x daily - 7 x weekly - 3 sets - 10 reps   ASSESSMENT:  CLINICAL IMPRESSION: Pt has met all goals at this time and is ready for d/c from PT services.  Will d/c PT today.    OBJECTIVE IMPAIRMENTS: decreased strength, impaired UE functional use, postural dysfunction, and pain.   ACTIVITY LIMITATIONS: carrying, lifting, bending, sleeping, bed mobility, reach over head, and hygiene/grooming  PARTICIPATION LIMITATIONS: meal prep, cleaning, laundry, driving, shopping, community activity, and yard work  PERSONAL FACTORS: 3+ comorbidities: Alopecia, anemia, CKD, DDD, HTN, HLD are also affecting patient's functional outcome.   REHAB POTENTIAL: Good  CLINICAL DECISION MAKING: Stable/uncomplicated  EVALUATION COMPLEXITY: Low   GOALS: Goals reviewed with patient?  Yes  SHORT TERM GOALS: Target date: 08/16/2023  Independent with initial HEP Goal status: MET 08/27/23   LONG TERM GOALS: Target date: 09/06/2023  Independent with final HEP Goal status: MET 08/27/23  2.  Bil shoulder strength improved to 4/5 for improved function and mobility Goal status: MET 08/27/23  3.  Report pain < 2/10 with overhead activities for improved function Goal status: MET 08/27/23    PLAN:  PT FREQUENCY: 1x/week (anticipate we will see every other week, but will see up to 1x/wk if needed)  PT DURATION: 6 weeks  PLANNED INTERVENTIONS: 97164- PT Re-evaluation, 97110-Therapeutic exercises, 97530- Therapeutic activity, 97112- Neuromuscular re-education, 97535- Self Care, 78295- Manual therapy, [id number]- Aquatic Therapy, A2130- Electrical stimulation (unattended), 97035- Ultrasound, 86578- Ionotophoresis 4mg /ml Dexamethasone, Patient/Family education, Taping, Dry Needling, Joint mobilization, Cryotherapy, and Moist heat.  PLAN FOR NEXT SESSION: d/c PT today    NEXT MD VISIT: 09/10/23   Marley Simmers, PT, DPT 08/27/23 9:11 AM   PHYSICAL THERAPY DISCHARGE SUMMARY  Visits from Start of Care: 2  Current functional level related to goals / functional outcomes: See above   Remaining deficits: See above   Education / Equipment: HEP   Patient agrees to discharge. Patient goals were met. Patient is being discharged due to meeting the stated rehab goals.   Marley Simmers, PT, DPT 08/27/23 9:11 AM  [facility] [address] [place], [place], [address] Phone: [phone]   Fax:  [phone]

## 2023-09-10 ENCOUNTER — Ambulatory Visit: Admitting: Family Medicine

## 2023-09-10 NOTE — Progress Notes (Unsigned)
   Joanna Muck, PhD, LAT, ATC acting as a scribe for Garlan Juniper, MD.  Deborah Schultz is a 78 y.o. female who presents to [facility] at [facility] today for 62-month f/u bilat shoulder pain. Pt was last seen by Dr. Alease Hunter on 07/12/23 and was given bilat subacromial steroid injections and was referred to PT, completing 2 visits. D/c on 08/27/23.  Today, pt reports she is feeling a lot better. Shoulder pain only bothers her occasionally, but not near as severe. She has been working on [employer] as provided by PT.   Dx imaging: 07/12/23 L shoulder XR 03/22/23 R shoulder XR  Pertinent review of systems: No fever or chills  Relevant historical information: Renal artery stenosis.   Exam:  BP 122/76   Pulse 62   Ht 5' (1.524 m)   Wt 171 lb (77.6 kg)   SpO2 98%   BMI 33.40 kg/m  General: Well Developed, well nourished, and in no acute distress.   MSK: Shoulders normal-appearing bilaterally.  Intact strength.  Normal range of motion.         Assessment and Plan: 78 y.o. female with chronic bilateral shoulder pain.  This is a chronic problem.  She had subacromial injections 2 months ago that have worked well.  Physical therapy has been quite helpful.  Plan to continue home exercise program and check back as needed.  Could do an injection in a month if needed.   PDMP not reviewed this encounter. No orders of the defined types were placed in this encounter.  No orders of the defined types were placed in this encounter.    Discussed warning signs or symptoms. Please see discharge instructions. Patient expresses understanding.   The above documentation has been reviewed and is accurate and complete Garlan Juniper, M.D.

## 2023-09-14 ENCOUNTER — Ambulatory Visit (INDEPENDENT_AMBULATORY_CARE_PROVIDER_SITE_OTHER): Admitting: Family Medicine

## 2023-09-14 VITALS — BP 122/76 | HR 62 | Ht 60.0 in | Wt 171.0 lb

## 2023-09-14 DIAGNOSIS — M25512 Pain in left shoulder: Secondary | ICD-10-CM

## 2023-09-14 DIAGNOSIS — G8929 Other chronic pain: Secondary | ICD-10-CM | POA: Diagnosis not present

## 2023-09-14 DIAGNOSIS — M25511 Pain in right shoulder: Secondary | ICD-10-CM

## 2023-09-14 NOTE — Patient Instructions (Addendum)
 Thank you for coming in today.   Continue working on your home exercises  Check back as needed

## 2023-10-15 ENCOUNTER — Encounter: Payer: Self-pay | Admitting: Internal Medicine

## 2023-10-15 ENCOUNTER — Ambulatory Visit: Payer: PPO | Attending: Internal Medicine | Admitting: Internal Medicine

## 2023-10-15 VITALS — BP 130/60 | HR 53 | Ht 61.0 in | Wt 172.4 lb

## 2023-10-15 DIAGNOSIS — R600 Localized edema: Secondary | ICD-10-CM

## 2023-10-15 DIAGNOSIS — I10 Essential (primary) hypertension: Secondary | ICD-10-CM

## 2023-10-15 DIAGNOSIS — Z79899 Other long term (current) drug therapy: Secondary | ICD-10-CM

## 2023-10-15 LAB — LIPID PANEL
Chol/HDL Ratio: 2.5 ratio (ref 0.0–4.4)
Cholesterol, Total: 167 mg/dL (ref 100–199)
HDL: 67 mg/dL (ref >39)
LDL Chol Calc (NIH): 75 mg/dL (ref 0–99)
Triglycerides: 148 mg/dL (ref 0–149)
VLDL Cholesterol Cal: 25 mg/dL (ref 5–40)

## 2023-10-15 LAB — BASIC METABOLIC PANEL WITH GFR
BUN/Creatinine Ratio: 14 (ref 12–28)
BUN: 18 mg/dL (ref 8–27)
CO2: 22 mmol/L (ref 20–29)
Calcium: 10.2 mg/dL (ref 8.7–10.3)
Chloride: 99 mmol/L (ref 96–106)
Creatinine, Ser: 1.32 mg/dL — ABNORMAL HIGH (ref 0.57–1.00)
Glucose: 81 mg/dL (ref 70–99)
Potassium: 4.5 mmol/L (ref 3.5–5.2)
Sodium: 139 mmol/L (ref 134–144)
eGFR: 42 mL/min/{1.73_m2} — ABNORMAL LOW (ref >59)

## 2023-10-15 NOTE — Progress Notes (Deleted)
  Cardiology Office Note   Date:  10/15/2023  ID:  Deborah, Schultz [birth year], MRN [id number] PCP: Deborah Schultz ORN, MD  [facility] Providers Cardiologist:  Deborah DELENA Merck, MD    History of Present Illness Deborah Schultz is a 78 y.o. female with a past medical history of HTN. He is followed by Dr. Merck and presents today for BP check and med titration.   Patient previously underwent coronary CTA 08/2022 that showed a coronary calcium  score of 0, normal coronary arteries. Echocardiogram 08/2022 showed EF 60-65%, no wall motion abnormalities, normal LV diastolic parameters, normal RV systolic function, normal pulmonary artery systolic pressure, no significant valvular abnormalities.   She has had issues with labile BP in the past.  HTN   HLD     ROS: ***  Studies Reviewed      *** Risk Assessment/Calculations {Does this patient have ATRIAL FIBRILLATION?:[id number]}         Physical Exam VS:  There were no vitals taken for this visit.       Wt Readings from Last 3 Encounters:  10/15/23 172 lb 6.4 oz (78.2 kg)  09/14/23 171 lb (77.6 kg)  05/12/23 171 lb (77.6 kg)    GEN: Well nourished, well developed in no acute distress NECK: No JVD; No carotid bruits CARDIAC: ***RRR, no murmurs, rubs, gallops RESPIRATORY:  Clear to auscultation without rales, wheezing or rhonchi  ABDOMEN: Soft, non-tender, non-distended EXTREMITIES:  No edema; No deformity   ASSESSMENT AND PLAN ***    {Are you ordering a CV Procedure (e.g. stress test, cath, DCCV, TEE, etc)?   Press F2        :[id number]}  Dispo: ***  Signed, Deborah FABIENE Louder, PA-C

## 2023-10-15 NOTE — Progress Notes (Signed)
 Cardiology Office Note:  .   Date:  10/15/2023  ID:  Deborah, Schultz 1945-07-02, MRN [id number] PCP: Norleen Lynwood ORN, MD  [facility] Providers Cardiologist:  Soyla DELENA Merck, MD    History of Present Illness: .   Deborah Schultz is a 78 y.o. female.  Discussed the use of AI scribe software for clinical note transcription with the patient, who gave verbal consent to proceed.  History of Present Illness Deborah Schultz is a 78 year old female with stage three chronic kidney disease who presents with worsening edema in her feet, knees, and legs.  Edema in her feet, knees, and legs has persisted for over a year, now lasting throughout the day and causing skin cracking, which impairs her ability to stand. She takes furosemide , two tablets daily, and spironolactone , half a tablet daily, without significant improvement. Amlodipine  is taken once daily for blood pressure management. There is no shortness of breath, chest pain, or increased shortness of breath with activity. She previously used compression socks but found them painful. She avoids adding salt to her food but consumes salty snacks. Her blood pressure at home was 115/61 this morning.    ROS: negative except per HPI above.  Studies Reviewed: SABRA   EKG Interpretation Date/Time:  Friday October 15 2023 08:06:58 EDT Ventricular Rate:  53 PR Interval:  246 QRS Duration:  64 QT Interval:  422 QTC Calculation: 395 R Axis:   54  Text Interpretation: Sinus bradycardia with marked sinus arrhythmia with 1st degree A-V block When compared with ECG of 09-Apr-2023 09:00, No significant change was found Confirmed by Merck Soyla ([id number]) on 10/15/2023 8:22:12 AM    Results RADIOLOGY CT scan of the heart: Normal (2024)  DIAGNOSTIC Echocardiogram: Normal (2024) EKG: Normal (10/15/2023) Risk Assessment/Calculations:       Physical Exam:   VS:  BP 130/60   Pulse (!) 53   Ht 5' 1 (1.549 m)   Wt  172 lb 6.4 oz (78.2 kg)   SpO2 97%   BMI 32.57 kg/m    Wt Readings from Last 3 Encounters:  10/15/23 172 lb 6.4 oz (78.2 kg)  09/14/23 171 lb (77.6 kg)  05/12/23 171 lb (77.6 kg)     Physical Exam VITALS: BP- 130/60 GENERAL: Alert, cooperative, well developed, no acute distress. HEENT: Normocephalic, normal oropharynx, moist mucous membranes. CHEST: Clear to auscultation bilaterally, no wheezes, rhonchi, or crackles. CARDIOVASCULAR: Normal heart rate and rhythm, S1 and S2 normal without murmurs. ABDOMEN: Soft, non-tender, non-distended, without organomegaly, normal bowel sounds. EXTREMITIES: Mild edema over feet, no cyanosis. NEUROLOGICAL: Cranial nerves grossly intact, moves all extremities without gross motor or sensory deficit.   ASSESSMENT AND PLAN: .    Assessment and Plan Assessment & Plan Peripheral edema Chronic peripheral edema likely due to venous insufficiency. Normal echocardiogram and coronary CT scan. Current diuretics ineffective. Amlodipine  may contribute to edema. - Stop amlodipine  to assess impact on edema. - Recommend light to medium compression socks (10-15 mmHg). - Advise low-salt diet. - Monitor blood pressure daily, log readings and symptoms. - Order BMP to assess kidney function and electrolytes today to facilitate medication changes. - Discuss lab results and medication adjustments in follow-up with app.   Hypertension Hypertension managed with amlodipine , furosemide , and spironolactone . Blood pressure generally well-controlled. Stopping amlodipine  requires close monitoring. - Stop amlodipine , monitor blood pressure closely. - Schedule follow-up in 1-2 weeks for blood pressure check and medication titration. - Consider alternative antihypertensive based on lab results  and blood pressure readings.       Soyla Merck, MD, FACC

## 2023-10-15 NOTE — Patient Instructions (Addendum)
 Medication Instructions:   Stop: Amlodipine  (Norvasc )  Lab Work: Today: BMET and Lipid Panel If you have labs (blood work) drawn today and your tests are completely normal, you will receive your results only by: MyChart Message (if you have MyChart) OR A paper copy in the mail If you have any lab test that is abnormal or we need to change your treatment, we will call you to review the results.   Follow-Up: At [facility], you and your health needs are our priority.  As part of our continuing mission to provide you with exceptional heart care, our providers are all part of one team.  This team includes your primary Cardiologist (physician) and Advanced Practice Providers or APPs (Physician Assistants and Nurse Practitioners) who all work together to provide you with the care you need, when you need it.  Your next appointment:    10/26/2023 at 1:30 pm  Provider:    Rollo Louder, PA   Other Instructions  Please wear Compression hose/socks during the Day. Also, please follow a low sodium diet.  odium Free Flavoring When cooking, the following items may be used for flavoring instead of salt or seasonings that contain sodium. Remember: A little bit of spice goes a long way! Be careful not to overseason. Spice Blend Recipe (makes about ? cup)  5 teaspoons onion powder  2 teaspoons garlic powder  2 teaspoons paprika  2 teaspoon dry mustard  1 teaspoon crushed thyme leaves   teaspoon white pepper   teaspoon celery seed  Food Item [address]  [place], [address] [place], [place], [place], Dry mustard, Garlic, Grape jelly, Green pepper, Mace, Marjoram, Mushrooms (fresh), Nutmeg, Onion or onion powder, Parsley, Pepper, Rosemary,Sage  Chicken [address], [address] [place], [place], [place], Mushrooms (fresh), Nutmeg, Oregano, Paprika, [place], [place], [place], [place], Savory, Tarragon, Thyme,Tomato, Turmeric  Eggs [place], [place], [place], [place], Garlic or garlic powder, Green  pepper, Jelly, Mushrooms (fresh), Nutmeg, Onion powder, Paprika, Parsley, Rosemary, [place], Tomato  Fish [place], [place], [place], [place], [place], Dry mustard, Green pepper, Lemon juice, Marjoram, Mushrooms (fresh), Paprika, Pepper, Tarragon, Tomato, Turmeric  Vegetables: Reatha Berger, Garlic or garlic powder, Ginger, Lemon juice, Mace, Marjoram, Nutmeg, Onion or onion powder, Tarragon, Tomato, Sugar or sugar substitute, Salt-free salad dressing, Vinegar    [place]:

## 2023-10-17 ENCOUNTER — Encounter: Payer: Self-pay | Admitting: Internal Medicine

## 2023-10-18 NOTE — Telephone Encounter (Addendum)
 Called and spoke to pt. She states that at her last office visit, RN (this RN), did not explain that she would be seeing an APP and NOT Dr. Loni. She states she would rather f/u with her PCP if Dr. Loni has no openings within next two weeks. Her BP's have been, fine. Canceled her appt with Vic[place], [place] and put her on wait list for 11/02/23 with Dr. Loni.  Will get back in touch with her on 10/25/23. Tena out of office/on Vac until then)

## 2023-10-19 ENCOUNTER — Encounter: Payer: Self-pay | Admitting: *Deleted

## 2023-10-26 ENCOUNTER — Ambulatory Visit: Admitting: Cardiology

## 2023-10-28 NOTE — Telephone Encounter (Signed)
 LVM and sent [facility] msg. Advised her to see PCP for a BP check. Also offered 11/18/23 at 2:20 pm slot with Dr. Loni (ok to book this 24 hr acute slot per Dr. DELENA).

## 2023-11-09 ENCOUNTER — Ambulatory Visit: Payer: PPO | Admitting: Internal Medicine

## 2023-11-10 ENCOUNTER — Other Ambulatory Visit (INDEPENDENT_AMBULATORY_CARE_PROVIDER_SITE_OTHER)

## 2023-11-10 DIAGNOSIS — R739 Hyperglycemia, unspecified: Secondary | ICD-10-CM | POA: Diagnosis not present

## 2023-11-10 LAB — LIPID PANEL
Cholesterol: 143 mg/dL (ref 0–200)
HDL: 54.9 mg/dL (ref >39.00)
LDL Cholesterol: 70 mg/dL (ref 0–99)
NonHDL: 88.11
Total CHOL/HDL Ratio: 3
Triglycerides: 93 mg/dL (ref 0.0–149.0)
VLDL: 18.6 mg/dL (ref 0.0–40.0)

## 2023-11-10 LAB — BASIC METABOLIC PANEL WITH GFR
BUN: 17 mg/dL (ref 6–23)
CO2: 31 meq/L (ref 19–32)
Calcium: 9.7 mg/dL (ref 8.4–10.5)
Chloride: 99 meq/L (ref 96–112)
Creatinine, Ser: 1.25 mg/dL — ABNORMAL HIGH (ref 0.40–1.20)
GFR: 41.89 mL/min — ABNORMAL LOW (ref >60.00)
Glucose, Bld: 76 mg/dL (ref 70–99)
Potassium: 3.9 meq/L (ref 3.5–5.1)
Sodium: 136 meq/L (ref 135–145)

## 2023-11-10 LAB — HEPATIC FUNCTION PANEL
ALT: 24 U/L (ref 0–35)
AST: 24 U/L (ref 0–37)
Albumin: 4.3 g/dL (ref 3.5–5.2)
Alkaline Phosphatase: 64 U/L (ref 39–117)
Bilirubin, Direct: 0.2 mg/dL (ref 0.0–0.3)
Total Bilirubin: 0.6 mg/dL (ref 0.2–1.2)
Total Protein: 7.4 g/dL (ref 6.0–8.3)

## 2023-11-10 LAB — HEMOGLOBIN A1C: Hgb A1c MFr Bld: 6.2 % (ref 4.6–6.5)

## 2023-11-16 ENCOUNTER — Encounter: Payer: Self-pay | Admitting: Internal Medicine

## 2023-11-16 ENCOUNTER — Ambulatory Visit (INDEPENDENT_AMBULATORY_CARE_PROVIDER_SITE_OTHER): Admitting: Internal Medicine

## 2023-11-16 VITALS — BP 128/58 | HR 65 | Temp 98.3°F | Ht 61.0 in | Wt 172.6 lb

## 2023-11-16 DIAGNOSIS — N1831 Chronic kidney disease, stage 3a: Secondary | ICD-10-CM | POA: Diagnosis not present

## 2023-11-16 DIAGNOSIS — R739 Hyperglycemia, unspecified: Secondary | ICD-10-CM | POA: Diagnosis not present

## 2023-11-16 DIAGNOSIS — E7800 Pure hypercholesterolemia, unspecified: Secondary | ICD-10-CM

## 2023-11-16 DIAGNOSIS — I10 Essential (primary) hypertension: Secondary | ICD-10-CM | POA: Diagnosis not present

## 2023-11-16 NOTE — Progress Notes (Unsigned)
 Patient ID: Deborah Schultz, female   DOB: 08-Mar-1946, 78 y.o.   MRN: [id number]        Chief Complaint: follow up [id number], abnormal urine bubbles, hld, hyperglycemia,        HPI:  Deborah Schultz is a 78 y.o. female here overall doing ok,  Pt denies chest pain, increased sob or doe, wheezing, orthopnea, PND, increased LE swelling, palpitations, dizziness or syncope.  Pt denies polydipsia, polyuria, or new focal neuro s/s.    Pt has been Having some difficulty with phone follow up to local renal and also [facility] renal, still no appt.  Depends more on the home phone number than her cell phone.  Now off amlodipine  due to leg swelling for 1 mo per cardiology , also finds it too difficult to use the compression stockings but now has the 10-15 lower compression and wearing those.  Denies urinary symptoms such as dysuria, frequency, urgency, flank pain, hematuria or n/v, fever, chills but has had bubbles recently, asking for UA today.   Wt Readings from Last 3 Encounters:  11/16/23 172 lb 9.6 oz (78.3 kg)  10/15/23 172 lb 6.4 oz (78.2 kg)  09/14/23 171 lb (77.6 kg)   BP Readings from Last 3 Encounters:  11/16/23 (!) 128/58  10/15/23 130/60  09/14/23 122/76         Past Medical History:  Diagnosis Date   Allergy 10 or more   years ago for most   ALOPECIA 06/06/2008   Anemia    iron deficiency   ANEMIA-IRON DEFICIENCY 06/06/2008   Arthritis unknown   progressed over time   BICEPS TENDINITIS, LEFT 05/06/2010   Blood transfusion without reported diagnosis    once-surgery in [place]   BRONCHITIS, ACUTE 06/18/2008   CALLUSES, FEET, BILATERAL 06/06/2008   Cataract August 2011   both eyes - had surgery   Chronic kidney disease    Chronic pruritus    CONSTIPATION 06/06/2008   Cough 06/18/2008   Degenerative disc disease, cervical    Diverticulitis    DIVERTICULITIS, HX OF 06/06/2008   ELBOW PAIN 05/26/2010   FATIGUE 06/18/2008   GERD (gastroesophageal reflux  disease) unknown   not affected in several years   HLD (hyperlipidemia)    HTN (hypertension)    HYPERLIPIDEMIA 06/06/2008   OAB (overactive bladder)    OVERACTIVE BLADDER 06/06/2008   PERIPHERAL EDEMA 02/19/2009   Peripheral edema 02/26/2011   Right carpal tunnel syndrome    SINUSITIS- ACUTE-NOS 01/08/2009   Past Surgical History:  Procedure Laterality Date   ABDOMINAL HYSTERECTOMY     APPENDECTOMY     BREAST BIOPSY     BUNIONECTOMY     CATARACT EXTRACTION     bilateral 11/2009-no longer wears glasses   ECTOPIC PREGNANCY SURGERY     EYE SURGERY  August 2011   performed by Dr. Evalene Raw   OOPHORECTOMY     right thumb surgery      reports that she has never smoked. She has never used smokeless tobacco. She reports current alcohol use. She reports that she does not use drugs. family history includes Cancer in her maternal aunt and maternal uncle; Diabetes in her brother and brother; Hypertension in her mother; Ovarian cancer in her mother. Allergies  Allergen Reactions   Nystatin  Rash   Sulfa Antibiotics Anaphylaxis and Other (See Comments)    Per pt: unknown reaction   Esomeprazole Magnesium     REACTION: Headache   Fexofenadine  REACTION: Hives   Sulfonamide Derivatives     Per pt: unknown reaction   Current Outpatient Medications on File Prior to Visit  Medication Sig Dispense Refill   aspirin 81 MG tablet Take 81 mg by mouth daily.       cholecalciferol (VITAMIN D3) 25 MCG (1000 UNIT) tablet Take 1,000 Units by mouth daily.     estradiol  (ESTRACE ) 1 MG tablet Take 1 tablet (1 mg total) by mouth daily. 90 tablet 3   furosemide  (LASIX ) 40 MG tablet TAKE 1 TABLET BY MOUTH TWICE DAILY (SECOND  DOSE  SHOULD  BE  TAKEN  AS  NEEDED  FOR  PERSISTENT  SWELLING) 180 tablet 1   hydrocortisone 2.5 % cream Apply 1 Application topically 2 (two) times daily as needed.     Multiple Vitamin (MULTIVITAMIN) tablet Take 1 tablet by mouth daily.       oxybutynin  (DITROPAN  XL) 10 MG  24 hr tablet Take 1 tablet (10 mg total) by mouth at bedtime. 90 tablet 3   propranolol  (INNOPRAN  XL) 80 MG 24 hr capsule Take 1 capsule (80 mg total) by mouth daily. 90 capsule 3   rosuvastatin  (CRESTOR ) 40 MG tablet Take 1 tablet (40 mg total) by mouth daily. 90 tablet 3   telmisartan  (MICARDIS ) 80 MG tablet Take 1 tablet (80 mg total) by mouth daily. 90 tablet 3   triamcinolone  cream (KENALOG ) 0.1 % Apply 1 Application topically 2 (two) times daily. 30 g 2   spironolactone  (ALDACTONE ) 25 MG tablet Take 0.5 tablets (12.5 mg total) by mouth once for 1 dose. 45 tablet 3   No current facility-administered medications on file prior to visit.        ROS:  All others reviewed and negative.  Objective        PE:  BP (!) 128/58   Pulse 65   Temp 98.3 F (36.8 C)   Ht 5' 1 (1.549 m)   Wt 172 lb 9.6 oz (78.3 kg)   SpO2 99%   BMI 32.61 kg/m                 Constitutional: Pt appears in NAD               HENT: Head: NCAT.                Right Ear: External ear normal.                 Left Ear: External ear normal.                Eyes: . Pupils are equal, round, and reactive to light. Conjunctivae and EOM are normal               Nose: without d/c or deformity               Neck: Neck supple. Gross normal ROM               Cardiovascular: Normal rate and regular rhythm.                 Pulmonary/Chest: Effort normal and breath sounds without rales or wheezing.                Abd:  Soft, NT, ND, + BS, no organomegaly               Neurological: Pt is alert. At baseline orientation, motor grossly intact  Skin: Skin is warm. No rashes, no other new lesions, LE edema - none               Psychiatric: Pt behavior is normal without agitation   Micro: none  Cardiac tracings I have personally interpreted today:  none  Pertinent Radiological findings (summarize): none   Lab Results  Component Value Date   WBC 9.9 05/07/2023   HGB 12.4 05/07/2023   HCT 37.4 05/07/2023   PLT  285.0 05/07/2023   GLUCOSE 76 11/10/2023   CHOL 143 11/10/2023   TRIG 93.0 11/10/2023   HDL 54.90 11/10/2023   LDLCALC 70 11/10/2023   ALT 24 11/10/2023   AST 24 11/10/2023   NA 136 11/10/2023   K 3.9 11/10/2023   CL 99 11/10/2023   CREATININE 1.25 (H) 11/10/2023   BUN 17 11/10/2023   CO2 31 11/10/2023   TSH 5.18 05/07/2023   HGBA1C 6.2 11/10/2023   Assessment/Plan:  Deborah Schultz is a 78 y.o. Black or African American [2] female with  has a past medical history of Allergy (10 or more), ALOPECIA (06/06/2008), Anemia, ANEMIA-IRON DEFICIENCY (06/06/2008), Arthritis (unknown), BICEPS TENDINITIS, LEFT (05/06/2010), Blood transfusion without reported diagnosis, BRONCHITIS, ACUTE (06/18/2008), CALLUSES, FEET, BILATERAL (06/06/2008), Cataract (August 2011), Chronic kidney disease, Chronic pruritus, CONSTIPATION (06/06/2008), Cough (06/18/2008), Degenerative disc disease, cervical, Diverticulitis, DIVERTICULITIS, HX OF (06/06/2008), ELBOW PAIN (05/26/2010), FATIGUE (06/18/2008), GERD (gastroesophageal reflux disease) (unknown), HLD (hyperlipidemia), HTN (hypertension), HYPERLIPIDEMIA (06/06/2008), OAB (overactive bladder), OVERACTIVE BLADDER (06/06/2008), PERIPHERAL EDEMA (02/19/2009), Peripheral edema (02/26/2011), Right carpal tunnel syndrome, and SINUSITIS- ACUTE-NOS (01/08/2009).  Hyperlipidemia Lab Results  Component Value Date   LDLCALC 70 11/10/2023   Uncontrolled,  pt to continue current statin crestor  80 mg every day and low chol diet, declines add zetia for now   Hyperglycemia Lab Results  Component Value Date   HGBA1C 6.2 11/10/2023   Stable, pt to continue current medical treatment  - diet, wt control   Essential hypertension BP Readings from Last 3 Encounters:  11/16/23 (!) 128/58  10/15/23 130/60  09/14/23 122/76   Stable, pt to continue medical treatment inderal  LA 80 every day, micardis  80 qd   CKD (chronic kidney disease) stage 3, GFR 30-59 ml/min  (HCC) With recent worsening, pt states not contacted yet regarding referral - will do referral today  Lab Results  Component Value Date   CREATININE 1.25 (H) 11/10/2023    cont to avoid nephrotoxins  Followup: Return in about 6 months (around 05/18/2024).  Lynwood Rush, MD 11/18/2023 12:23 PM [facility] Medicine

## 2023-11-16 NOTE — Patient Instructions (Signed)
 Please continue all other medications as before, and refills have been done if requested.  Please have the pharmacy call with any other refills you may need.  Please continue your efforts at being more active, low cholesterol diet, and weight control.  Please keep your appointments with your specialists as you may have planned  You will be contacted regarding the referral for: Kidney doctor  - Dr Maryjane  Please make an Appointment to return in 6 months, or sooner if needed, also with Lab Appointment for testing done 3-5 days before at the FIRST FLOOR Lab (so this is for TWO appointments - please see the scheduling desk as you leave)

## 2023-11-18 ENCOUNTER — Encounter: Payer: Self-pay | Admitting: Internal Medicine

## 2023-11-18 NOTE — Assessment & Plan Note (Signed)
 Lab Results  Component Value Date   LDLCALC 70 11/10/2023   Uncontrolled,  pt to continue current statin crestor  80 mg every day and low chol diet, declines add zetia for now

## 2023-11-18 NOTE — Assessment & Plan Note (Signed)
 BP Readings from Last 3 Encounters:  11/16/23 (!) 128/58  10/15/23 130/60  09/14/23 122/76   Stable, pt to continue medical treatment inderal  LA 80 every day, micardis  80 qd

## 2023-11-18 NOTE — Assessment & Plan Note (Signed)
 Lab Results  Component Value Date   HGBA1C 6.2 11/10/2023   Stable, pt to continue current medical treatment  - diet, wt control

## 2023-11-18 NOTE — Assessment & Plan Note (Addendum)
 With recent worsening, pt states not contacted yet regarding referral - will do referral today  Lab Results  Component Value Date   CREATININE 1.25 (H) 11/10/2023    cont to avoid nephrotoxins

## 2023-11-22 ENCOUNTER — Ambulatory Visit: Payer: Self-pay | Admitting: Internal Medicine

## 2023-11-25 ENCOUNTER — Other Ambulatory Visit: Payer: Self-pay | Admitting: Internal Medicine

## 2023-11-25 DIAGNOSIS — Z1231 Encounter for screening mammogram for malignant neoplasm of breast: Secondary | ICD-10-CM

## 2023-12-11 ENCOUNTER — Other Ambulatory Visit: Payer: Self-pay | Admitting: Internal Medicine

## 2023-12-27 ENCOUNTER — Other Ambulatory Visit: Payer: Self-pay | Admitting: Medical Genetics

## 2024-01-03 ENCOUNTER — Ambulatory Visit
Admission: RE | Admit: 2024-01-03 | Discharge: 2024-01-03 | Disposition: A | Discharge status: Home / Self Care | Source: Ambulatory Visit | Attending: Internal Medicine | Admitting: Internal Medicine

## 2024-01-03 DIAGNOSIS — Z1231 Encounter for screening mammogram for malignant neoplasm of breast: Secondary | ICD-10-CM

## 2024-01-08 ENCOUNTER — Other Ambulatory Visit: Payer: Self-pay | Admitting: Internal Medicine

## 2024-01-12 ENCOUNTER — Other Ambulatory Visit

## 2024-01-14 DIAGNOSIS — R609 Edema, unspecified: Secondary | ICD-10-CM | POA: Diagnosis not present

## 2024-01-14 DIAGNOSIS — N1832 Chronic kidney disease, stage 3b: Secondary | ICD-10-CM | POA: Diagnosis not present

## 2024-01-14 DIAGNOSIS — D631 Anemia in chronic kidney disease: Secondary | ICD-10-CM | POA: Diagnosis not present

## 2024-01-14 DIAGNOSIS — E785 Hyperlipidemia, unspecified: Secondary | ICD-10-CM | POA: Diagnosis not present

## 2024-01-14 DIAGNOSIS — N2581 Secondary hyperparathyroidism of renal origin: Secondary | ICD-10-CM | POA: Diagnosis not present

## 2024-01-14 DIAGNOSIS — I129 Hypertensive chronic kidney disease with stage 1 through stage 4 chronic kidney disease, or unspecified chronic kidney disease: Secondary | ICD-10-CM | POA: Diagnosis not present

## 2024-01-16 LAB — LAB REPORT - SCANNED
Albumin, Urine POC: <3
Albumin/Creatinine Ratio, Urine, POC: <13
Creatinine, POC: 23.4 mg/dL
EGFR: 42

## 2024-01-18 ENCOUNTER — Encounter: Payer: Self-pay | Admitting: Dermatology

## 2024-01-18 ENCOUNTER — Ambulatory Visit: Admitting: Dermatology

## 2024-01-18 VITALS — BP 142/89 | HR 63

## 2024-01-18 DIAGNOSIS — L309 Dermatitis, unspecified: Secondary | ICD-10-CM

## 2024-01-18 DIAGNOSIS — L299 Pruritus, unspecified: Secondary | ICD-10-CM

## 2024-01-18 DIAGNOSIS — L209 Atopic dermatitis, unspecified: Secondary | ICD-10-CM | POA: Diagnosis not present

## 2024-01-18 MED ORDER — MOMETASONE FUROATE 0.1 % EX CREA: 1.0000 | TOPICAL_CREAM | Freq: Every day | CUTANEOUS | 9 refills | Status: AC | PRN

## 2024-01-18 MED ORDER — TACROLIMUS 0.1 % EX OINT: TOPICAL_OINTMENT | Freq: Two times a day (BID) | CUTANEOUS | 9 refills | Status: AC

## 2024-01-18 NOTE — Progress Notes (Signed)
   Follow-Up Visit   Subjective  Deborah Schultz is a 78 y.o. female who presents for the following: Eczema  Patient present today for follow up visit for Eczema. Patient was last evaluated on 06/29/23. At this visit patient was prescribed TMC 0.1%. She was also advised to apply CeraVe Anti-itch lotion daily to keep her skin hydrated to prevnet itch. Patient reports sxs are worse.Patient states she called & was advised she needed to send in additional pictures to review. Patient she sent a Mychart message with her photos but I don't see it is indicated in her chart. Patient reports she is still very itchy. She states she mainly scratches at night when she is sleeping.   Patient denies medication changes. She stated she feels the [facility] is ineffective. She does not want to discuss any biologics at this time.Today patient rates itch 10 out of 10 on and off about 2 days out of the week.   The following portions of the chart were reviewed this encounter and updated as appropriate: medications, allergies, medical history  Review of Systems:  No other skin or systemic complaints except as noted in HPI or Assessment and Plan.  Objective  Well appearing patient in no apparent distress; mood and affect are within normal limits.  A full examination was performed including scalp, head, eyes, ears, nose, lips, neck, chest, axillae, abdomen, back, buttocks, bilateral upper extremities, bilateral lower extremities, hands, feet, fingers, toes, fingernails, and toenails. All findings within normal limits unless otherwise noted below.   Relevant exam findings are noted in the Assessment and Plan.                   Assessment & Plan    Atopic dermatitis with pruritus Chronic atopic dermatitis with intermittent pruritus, primarily on arms and chest. Recent flare resolved with triamcinolone . Prefers topical management despite discussion of systemic options like Dupixent and Nemluvio. No Active  Lesions Today  . - Use CeraVe anti-itch cream twice daily. - Prescribe mometasone for intense flares, use for two weeks. - Prescribe tacrolimus during two-week breaks from mometasone. - Recommend Aveeno oatmeal baths once to twice weekly. - Schedule follow-up in six months. - Advise to send messages via MyChart for questions.   ECZEMA, UNSPECIFIED TYPE   Related Medications triamcinolone  cream (KENALOG ) 0.1 % Apply 1 Application topically 2 (two) times daily. mometasone (ELOCON) 0.1 % cream Apply 1 Application topically daily as needed (Rash). Apply 2 times daily to apply 2 times daily for 2 weeks the STOP for 2 weeks and apply Tacrolimus tacrolimus (PROTOPIC) 0.1 % ointment Apply topically 2 (two) times daily. Apply 2 times daily to apply 2 times daily for 2 weeks the STOP for 2 weeks & apply Mometasone  Return in about 6 months (around 07/17/2024) for Eczema F/U.  I, Jetta Ager, am acting as Neurosurgeon for [employer], DO.  Documentation: I have reviewed the above documentation for accuracy and completeness, and I agree with the above.  Delon Lenis, DO

## 2024-01-18 NOTE — Patient Instructions (Addendum)
 VISIT SUMMARY:  Today, we discussed your ongoing issues with eczema, particularly the nocturnal itching and skin flares on your arms and chest. We reviewed your current treatment regimen and made some adjustments to better manage your symptoms.  YOUR PLAN:  -ATOPIC DERMATITIS WITH PRURITUS: Atopic dermatitis, also known as eczema, is a condition that makes your skin red and itchy. It is chronic and tends to flare periodically.  + For your treatment, continue using CeraVe anti-itch cream twice daily.  + For intense flares, use mometasone for two weeks. During the two-week breaks from mometasone, use tacrolimus.  + Additionally, take Aveeno oatmeal baths once or twice a week to help soothe your skin.  INSTRUCTIONS:  Please schedule a follow-up appointment in six months. If you have any questions or concerns before then, feel free to send a message via MyChart.       Important Information   Due to recent changes in healthcare laws, you may see results of your pathology and/or laboratory studies on MyChart before the doctors have had a chance to review them. We understand that in some cases there may be results that are confusing or concerning to you. Please understand that not all results are received at the same time and often the doctors may need to interpret multiple results in order to provide you with the best plan of care or course of treatment. Therefore, we ask that you please give us  2 business days to thoroughly review all your results before contacting the office for clarification. Should we see a critical lab result, you will be contacted sooner.     If You Need Anything After Your Visit   If you have any questions or concerns for your doctor, please call our main line at [phone]. If no one answers, please leave a voicemail as directed and we will return your call as soon as possible. Messages left after 4 pm will be answered the following business day.    You may also send  us  a message via MyChart. We typically respond to MyChart messages within 1-2 business days.  For prescription refills, please ask your pharmacy to contact our office. Our fax number is [phone].  If you have an urgent issue when the clinic is closed that cannot wait until the next business day, you can page your doctor at the number below.     Please note that while we do our best to be available for urgent issues outside of office hours, we are not available 24/7.    If you have an urgent issue and are unable to reach us , you may choose to seek medical care at your doctor's office, retail clinic, urgent care center, or emergency room.   If you have a medical emergency, please immediately call 911 or go to the emergency department. In the event of inclement weather, please call our main line at [phone] for an update on the status of any delays or closures.  Dermatology Medication Tips: Please keep the boxes that topical medications come in in order to help keep track of the instructions about where and how to use these. Pharmacies typically print the medication instructions only on the boxes and not directly on the medication tubes.   If your medication is too expensive, please contact our office at [phone] or send us  a message through MyChart.    We are unable to tell what your co-pay for medications will be in advance as this is different depending on your insurance coverage.  However, we may be able to find a substitute medication at lower cost or fill out paperwork to get insurance to cover a needed medication.    If a prior authorization is required to get your medication covered by your insurance company, please allow us  1-2 business days to complete this process.   Drug prices often vary depending on where the prescription is filled and some pharmacies may offer cheaper prices.   The website www.goodrx.com contains coupons for medications through different pharmacies. The  prices here do not account for what the cost may be with help from insurance (it may be cheaper with your insurance), but the website can give you the price if you did not use any insurance.  - You can print the associated coupon and take it with your prescription to the pharmacy.  - You may also stop by our office during regular business hours and pick up a GoodRx coupon card.  - If you need your prescription sent electronically to a different pharmacy, notify our office through [facility] or by phone at [phone]

## 2024-03-01 ENCOUNTER — Ambulatory Visit (INDEPENDENT_AMBULATORY_CARE_PROVIDER_SITE_OTHER)

## 2024-03-01 DIAGNOSIS — Z23 Encounter for immunization: Secondary | ICD-10-CM | POA: Diagnosis not present

## 2024-03-01 NOTE — Progress Notes (Signed)
 Pt was given HD flu vaccine w/o any complications at this time.

## 2024-03-27 ENCOUNTER — Ambulatory Visit: Payer: Self-pay | Admitting: Internal Medicine

## 2024-04-01 ENCOUNTER — Other Ambulatory Visit: Payer: Self-pay | Admitting: Internal Medicine

## 2024-04-03 ENCOUNTER — Other Ambulatory Visit: Payer: Self-pay

## 2024-04-24 ENCOUNTER — Other Ambulatory Visit: Payer: Self-pay | Admitting: Internal Medicine

## 2024-04-25 ENCOUNTER — Other Ambulatory Visit: Payer: Self-pay

## 2024-05-06 ENCOUNTER — Other Ambulatory Visit: Payer: Self-pay | Admitting: Internal Medicine

## 2024-05-08 ENCOUNTER — Other Ambulatory Visit: Payer: Self-pay

## 2024-05-11 ENCOUNTER — Encounter: Payer: Self-pay | Admitting: *Deleted

## 2024-05-11 NOTE — Progress Notes (Signed)
 Deborah Schultz                                          MRN: [id number]   05/11/2024   The [facility] Quality Team Specialist reviewed this patient medical record for the purposes of chart review for care gap closure. The following were reviewed: chart review for care gap closure-controlling blood pressure.    [facility] Quality Team

## 2024-05-19 ENCOUNTER — Encounter: Payer: Self-pay | Admitting: Internal Medicine

## 2024-05-19 ENCOUNTER — Ambulatory Visit: Admitting: Internal Medicine

## 2024-05-19 VITALS — BP 140/82 | HR 70 | Temp 98.9°F | Ht 61.0 in | Wt 169.0 lb

## 2024-05-19 DIAGNOSIS — R739 Hyperglycemia, unspecified: Secondary | ICD-10-CM | POA: Diagnosis not present

## 2024-05-19 DIAGNOSIS — I10 Essential (primary) hypertension: Secondary | ICD-10-CM | POA: Diagnosis not present

## 2024-05-19 DIAGNOSIS — Z0001 Encounter for general adult medical examination with abnormal findings: Secondary | ICD-10-CM

## 2024-05-19 DIAGNOSIS — Z0000 Encounter for general adult medical examination without abnormal findings: Secondary | ICD-10-CM

## 2024-05-19 DIAGNOSIS — E7800 Pure hypercholesterolemia, unspecified: Secondary | ICD-10-CM

## 2024-05-19 DIAGNOSIS — N1831 Chronic kidney disease, stage 3a: Secondary | ICD-10-CM | POA: Diagnosis not present

## 2024-05-19 MED ORDER — TELMISARTAN 80 MG PO TABS: 80.0000 mg | ORAL_TABLET | Freq: Every day | ORAL | 3 refills | Status: AC

## 2024-05-19 MED ORDER — PROPRANOLOL HCL ER BEADS 80 MG PO CP24: 80.0000 mg | ORAL_CAPSULE | Freq: Every day | ORAL | 3 refills | Status: AC

## 2024-05-19 MED ORDER — ROSUVASTATIN CALCIUM 40 MG PO TABS: 40.0000 mg | ORAL_TABLET | Freq: Every day | ORAL | 3 refills | Status: AC

## 2024-05-19 MED ORDER — SPIRONOLACTONE 25 MG PO TABS: 12.5000 mg | ORAL_TABLET | Freq: Once | ORAL | 3 refills | Status: AC

## 2024-05-19 MED ORDER — FUROSEMIDE 40 MG PO TABS: ORAL_TABLET | ORAL | 3 refills | Status: AC

## 2024-05-19 MED ORDER — OXYBUTYNIN CHLORIDE ER 10 MG PO TB24: 10.0000 mg | ORAL_TABLET | Freq: Every day | ORAL | 3 refills | Status: AC

## 2024-05-19 MED ORDER — ESTRADIOL 1 MG PO TABS: 1.0000 mg | ORAL_TABLET | Freq: Every day | ORAL | 3 refills | Status: AC

## 2024-05-19 NOTE — Progress Notes (Unsigned)
 Patient ID: Deborah Schultz, female   DOB: 1945/10/23, 79 y.o.   MRN: [id number]         Chief Complaint:: wellness exam and ckd3a, hyperglycemia, hld       HPI:  Deborah Schultz is a 79 y.o. female here for wellness exam; up to date                        Also Pt denies chest pain, increased sob or doe, wheezing, orthopnea, PND, increased LE swelling, palpitations, dizziness or syncope.   Pt denies polydipsia, polyuria, or new focal neuro s/s.    Pt denies fever, wt loss, night sweats, loss of appetite, or other constitutional symptoms     Wt Readings from Last 3 Encounters:  05/19/24 169 lb (76.7 kg)  11/16/23 172 lb 9.6 oz (78.3 kg)  10/15/23 172 lb 6.4 oz (78.2 kg)   BP Readings from Last 3 Encounters:  05/19/24 (!) 140/82  01/18/24 (!) 142/89  11/16/23 (!) 128/58   Immunization History  Administered Date(s) Administered   Fluad Quad(high Dose 65+) 12/23/2018, 03/02/2020, 02/26/2021, 02/20/2022   Fluad Trivalent(High Dose 65+) 02/24/2023   INFLUENZA, HIGH DOSE SEASONAL PF 02/02/2017, 12/16/2017, 03/01/2024   Influenza Split 02/29/2012   Influenza Whole 01/19/2008   Influenza-Unspecified 02/20/2022   PFIZER(Purple Top)SARS-COV-2 Vaccination 05/12/2019, 06/02/2019, 01/18/2020, 02/10/2021   Pfizer(Comirnaty )Fall Seasonal Vaccine 12 years and older 01/29/2022   Pneumococcal Conjugate-13 10/22/2016   Pneumococcal Polysaccharide-23 10/26/2017   Pneumococcal-Unspecified 10/26/2017   Td 06/06/2008   Tdap 09/19/2018   Unspecified SARS-COV-2 Vaccination 05/12/2019, 06/02/2019, 01/18/2020, 02/10/2021, 01/29/2022   Zoster Recombinant(Shingrix) 01/18/2023, 03/15/2023   Zoster, Live 06/06/2008   Health Maintenance Due  Topic Date Due   Medicare Annual Wellness (AWV)  07/18/2016      Past Medical History:  Diagnosis Date   Allergy 10 or more   years ago for most   ALOPECIA 06/06/2008   Anemia    iron deficiency   ANEMIA-IRON DEFICIENCY 06/06/2008   Arthritis  unknown   progressed over time   BICEPS TENDINITIS, LEFT 05/06/2010   Blood transfusion without reported diagnosis    once-surgery in [place]   BRONCHITIS, ACUTE 06/18/2008   CALLUSES, FEET, BILATERAL 06/06/2008   Cataract August 2011   both eyes - had surgery   Chronic kidney disease    Chronic pruritus    CONSTIPATION 06/06/2008   Cough 06/18/2008   Degenerative disc disease, cervical    Diverticulitis    DIVERTICULITIS, HX OF 06/06/2008   ELBOW PAIN 05/26/2010   FATIGUE 06/18/2008   GERD (gastroesophageal reflux disease) unknown   not affected in several years   HLD (hyperlipidemia)    HTN (hypertension)    HYPERLIPIDEMIA 06/06/2008   OAB (overactive bladder)    OVERACTIVE BLADDER 06/06/2008   PERIPHERAL EDEMA 02/19/2009   Peripheral edema 02/26/2011   Right carpal tunnel syndrome    SINUSITIS- ACUTE-NOS 01/08/2009   Past Surgical History:  Procedure Laterality Date   ABDOMINAL HYSTERECTOMY     APPENDECTOMY     BREAST BIOPSY     BUNIONECTOMY     CATARACT EXTRACTION     bilateral 11/2009-no longer wears glasses   ECTOPIC PREGNANCY SURGERY     EYE SURGERY  August 2011   performed by Dr. Evalene Raw   OOPHORECTOMY     right thumb surgery      reports that she has never smoked. She has never been exposed to tobacco smoke. She  has never used smokeless tobacco. She reports current alcohol use. She reports that she does not use drugs. family history includes Cancer in her maternal aunt and maternal uncle; Diabetes in her brother and brother; Hypertension in her mother; Ovarian cancer in her mother. Allergies[1] Medications Ordered Prior to Encounter[2]      ROS:  All others reviewed and negative.  Objective        PE:  BP (!) 140/82 (BP Location: Right Arm, Patient Position: Sitting, Cuff Size: Normal)   Pulse 70   Temp 98.9 F (37.2 C) (Oral)   Ht 5' 1 (1.549 m)   Wt 169 lb (76.7 kg)   SpO2 98%   BMI 31.93 kg/m                 Constitutional: Pt  appears in NAD               HENT: Head: NCAT.                Right Ear: External ear normal.                 Left Ear: External ear normal.                Eyes: . Pupils are equal, round, and reactive to light. Conjunctivae and EOM are normal               Nose: without d/c or deformity               Neck: Neck supple. Gross normal ROM               Cardiovascular: Normal rate and regular rhythm.                 Pulmonary/Chest: Effort normal and breath sounds without rales or wheezing.                Abd:  Soft, NT, ND, + BS, no organomegaly               Neurological: Pt is alert. At baseline orientation, motor grossly intact               Skin: Skin is warm. No rashes, no other new lesions, LE edema - none               Psychiatric: Pt behavior is normal without agitation   Micro: none  Cardiac tracings I have personally interpreted today:  none  Pertinent Radiological findings (summarize): none   Lab Results  Component Value Date   WBC 9.9 05/07/2023   HGB 12.4 05/07/2023   HCT 37.4 05/07/2023   PLT 285.0 05/07/2023   GLUCOSE 76 11/10/2023   CHOL 143 11/10/2023   TRIG 93.0 11/10/2023   HDL 54.90 11/10/2023   LDLCALC 70 11/10/2023   ALT 24 11/10/2023   AST 24 11/10/2023   NA 136 11/10/2023   K 3.9 11/10/2023   CL 99 11/10/2023   CREATININE 1.25 (H) 11/10/2023   BUN 17 11/10/2023   CO2 31 11/10/2023   TSH 5.18 05/07/2023   HGBA1C 6.2 11/10/2023   Assessment/Plan:  Deborah Schultz is a 79 y.o. Black or African American [2] female with  has a past medical history of Allergy (10 or more), ALOPECIA (06/06/2008), Anemia, ANEMIA-IRON DEFICIENCY (06/06/2008), Arthritis (unknown), BICEPS TENDINITIS, LEFT (05/06/2010), Blood transfusion without reported diagnosis, BRONCHITIS, ACUTE (06/18/2008), CALLUSES, FEET, BILATERAL (06/06/2008), Cataract (August 2011), Chronic kidney disease, Chronic pruritus, CONSTIPATION (06/06/2008),  Cough (06/18/2008), Degenerative disc  disease, cervical, Diverticulitis, DIVERTICULITIS, HX OF (06/06/2008), ELBOW PAIN (05/26/2010), FATIGUE (06/18/2008), GERD (gastroesophageal reflux disease) (unknown), HLD (hyperlipidemia), HTN (hypertension), HYPERLIPIDEMIA (06/06/2008), OAB (overactive bladder), OVERACTIVE BLADDER (06/06/2008), PERIPHERAL EDEMA (02/19/2009), Peripheral edema (02/26/2011), Right carpal tunnel syndrome, and SINUSITIS- ACUTE-NOS (01/08/2009).  Preventative health care Age and sex appropriate education and counseling updated with regular exercise and diet Referrals for preventative services - none needed Immunizations addressed - none needed Smoking counseling  - none needed Evidence for depression or other mood disorder - none significant Most recent labs reviewed. I have personally reviewed and have noted: 1) the patient's medical and social history 2) The patient's current medications and supplements 3) The patient's height, weight, and BMI have been recorded in the chart   CKD (chronic kidney disease) stage 3, GFR 30-59 ml/min (HCC) Lab Results  Component Value Date   CREATININE 1.25 (H) 11/10/2023   Stable overall, cont to avoid nephrotoxins ckd3a  Essential hypertension BP Readings from Last 3 Encounters:  05/19/24 (!) 140/82  01/18/24 (!) 142/89  11/16/23 (!) 128/58   Mild elevated and pt states controlled at ome, pt to continue medical treatment micardis  80 mg every day, declines other change   Hyperglycemia Lab Results  Component Value Date   HGBA1C 6.2 11/10/2023   Stable, pt to continue current medical treatment  - diet, wt control;   Hyperlipidemia Lab Results  Component Value Date   LDLCALC 70 11/10/2023   Stable, pt to continue current statin crestor  40 mg qd  Followup: Return in about 6 months (around 11/16/2024).  Lynwood Rush, MD 05/20/2024 2:20 PM [facility]     [1]  Allergies Allergen Reactions    Nystatin  Rash   Sulfa Antibiotics Anaphylaxis and Other (See Comments)    Per pt: unknown reaction   Esomeprazole Magnesium     REACTION: Headache   Fexofenadine     REACTION: Hives   Sulfonamide Derivatives     Per pt: unknown reaction  [2]  Current Outpatient Medications on File Prior to Visit  Medication Sig Dispense Refill   aspirin 81 MG tablet Take 81 mg by mouth daily.       cholecalciferol (VITAMIN D3) 25 MCG (1000 UNIT) tablet Take 1,000 Units by mouth daily.     hydrocortisone 2.5 % cream Apply 1 Application topically 2 (two) times daily as needed.     mometasone  (ELOCON ) 0.1 % cream Apply 1 Application topically daily as needed (Rash). Apply 2 times daily to apply 2 times daily for 2 weeks the STOP for 2 weeks and apply Tacrolimus  45 g 9   Multiple Vitamin (MULTIVITAMIN) tablet Take 1 tablet by mouth daily.       tacrolimus  (PROTOPIC ) 0.1 % ointment Apply topically 2 (two) times daily. Apply 2 times daily to apply 2 times daily for 2 weeks the STOP for 2 weeks & apply Mometasone  100 g 9   No current facility-administered medications on file prior to visit.

## 2024-05-19 NOTE — Patient Instructions (Signed)
 Please continue all other medications as before, and refills have been done if requested.  Please have the pharmacy call with any other refills you may need.  Please continue your efforts at being more active, low cholesterol diet, and weight control.  You are otherwise up to date with prevention measures today.  Please keep your appointments with your specialists as you may have planned - kidney doctor  We can hold on further Lab testing today  Please make an Appointment to return in 6 months, or sooner if needed

## 2024-05-20 ENCOUNTER — Encounter: Payer: Self-pay | Admitting: Internal Medicine

## 2024-05-20 NOTE — Assessment & Plan Note (Signed)
 Lab Results  Component Value Date   CREATININE 1.25 (H) 11/10/2023   Stable overall, cont to avoid nephrotoxins ckd3a

## 2024-05-20 NOTE — Assessment & Plan Note (Signed)
 BP Readings from Last 3 Encounters:  05/19/24 (!) 140/82  01/18/24 (!) 142/89  11/16/23 (!) 128/58   Mild elevated and pt states controlled at ome, pt to continue medical treatment micardis  80 mg every day, declines other change

## 2024-05-20 NOTE — Assessment & Plan Note (Signed)
 Lab Results  Component Value Date   HGBA1C 6.2 11/10/2023   Stable, pt to continue current medical treatment  - diet, wt control

## 2024-05-20 NOTE — Assessment & Plan Note (Signed)
 Lab Results  Component Value Date   LDLCALC 70 11/10/2023   Stable, pt to continue current statin crestor  40 mg qd

## 2024-05-20 NOTE — Assessment & Plan Note (Signed)

## 2024-07-17 ENCOUNTER — Ambulatory Visit: Admitting: Dermatology
